# Patient Record
Sex: Male | Born: 1965
Health system: Southern US, Community
[De-identification: ages and names within clinical notes are randomized; demographics above are authoritative.]

## PROBLEM LIST (undated history)

## (undated) DIAGNOSIS — M5030 Other cervical disc degeneration, unspecified cervical region: Secondary | ICD-10-CM

## (undated) DIAGNOSIS — M5010 Cervical disc disorder with radiculopathy, unspecified cervical region: Secondary | ICD-10-CM

## (undated) DIAGNOSIS — M1990 Unspecified osteoarthritis, unspecified site: Secondary | ICD-10-CM

## (undated) DIAGNOSIS — E785 Hyperlipidemia, unspecified: Secondary | ICD-10-CM

## (undated) DIAGNOSIS — I10 Essential (primary) hypertension: Secondary | ICD-10-CM

## (undated) DIAGNOSIS — E7800 Pure hypercholesterolemia, unspecified: Secondary | ICD-10-CM

## (undated) DIAGNOSIS — F419 Anxiety disorder, unspecified: Secondary | ICD-10-CM

## (undated) DIAGNOSIS — K52832 Lymphocytic colitis: Secondary | ICD-10-CM

## (undated) DIAGNOSIS — M47812 Spondylosis without myelopathy or radiculopathy, cervical region: Secondary | ICD-10-CM

## (undated) HISTORY — DX: Anxiety disorder, unspecified: F41.9

## (undated) HISTORY — DX: Essential (primary) hypertension: I10

## (undated) HISTORY — DX: Hyperlipidemia, unspecified: E78.5

## (undated) HISTORY — DX: Lymphocytic colitis: K52.832

## (undated) HISTORY — PX: HAND SURGERY: SHX662

---

## 2004-01-29 ENCOUNTER — Encounter: Admission: RE | Admit: 2004-01-29 | Discharge: 2004-01-29 | Payer: Self-pay | Admitting: Nurse Practitioner

## 2004-02-26 ENCOUNTER — Encounter: Payer: Self-pay | Admitting: Physical Medicine and Rehabilitation

## 2004-03-06 ENCOUNTER — Encounter: Payer: Self-pay | Admitting: Physical Medicine and Rehabilitation

## 2012-03-08 ENCOUNTER — Other Ambulatory Visit: Payer: Self-pay | Admitting: Family Medicine

## 2012-03-08 DIAGNOSIS — R7989 Other specified abnormal findings of blood chemistry: Secondary | ICD-10-CM

## 2012-03-11 ENCOUNTER — Ambulatory Visit
Admission: RE | Admit: 2012-03-11 | Discharge: 2012-03-11 | Disposition: A | Discharge status: Home / Self Care | Payer: 59 | Source: Ambulatory Visit | Attending: Family Medicine | Admitting: Family Medicine

## 2012-03-11 ENCOUNTER — Other Ambulatory Visit: Payer: Self-pay | Admitting: Family Medicine

## 2012-03-11 DIAGNOSIS — R7989 Other specified abnormal findings of blood chemistry: Secondary | ICD-10-CM

## 2012-03-22 ENCOUNTER — Other Ambulatory Visit: Payer: Self-pay | Admitting: Family Medicine

## 2012-03-22 DIAGNOSIS — R935 Abnormal findings on diagnostic imaging of other abdominal regions, including retroperitoneum: Secondary | ICD-10-CM

## 2012-03-24 ENCOUNTER — Ambulatory Visit
Admission: RE | Admit: 2012-03-24 | Discharge: 2012-03-24 | Disposition: A | Discharge status: Home / Self Care | Payer: 59 | Source: Ambulatory Visit | Attending: Family Medicine | Admitting: Family Medicine

## 2012-03-24 DIAGNOSIS — R935 Abnormal findings on diagnostic imaging of other abdominal regions, including retroperitoneum: Secondary | ICD-10-CM

## 2012-03-24 MED ORDER — IOHEXOL 300 MG/ML  SOLN
100.0000 mL | Freq: Once | INTRAMUSCULAR | Status: AC | PRN
  Administered 2012-03-24: 125 mL via INTRAVENOUS

## 2012-04-04 ENCOUNTER — Other Ambulatory Visit: Payer: Self-pay | Admitting: Family Medicine

## 2012-04-04 DIAGNOSIS — R160 Hepatomegaly, not elsewhere classified: Secondary | ICD-10-CM

## 2012-04-07 ENCOUNTER — Ambulatory Visit
Admission: RE | Admit: 2012-04-07 | Discharge: 2012-04-07 | Disposition: A | Discharge status: Home / Self Care | Payer: 59 | Source: Ambulatory Visit | Attending: Family Medicine | Admitting: Family Medicine

## 2012-04-07 DIAGNOSIS — R160 Hepatomegaly, not elsewhere classified: Secondary | ICD-10-CM

## 2012-04-07 MED ORDER — GADOBENATE DIMEGLUMINE 529 MG/ML IV SOLN
20.0000 mL | Freq: Once | INTRAVENOUS | Status: AC | PRN
  Administered 2012-04-07: 20 mL via INTRAVENOUS

## 2012-07-09 ENCOUNTER — Other Ambulatory Visit: Payer: Self-pay | Admitting: Family Medicine

## 2012-10-25 ENCOUNTER — Other Ambulatory Visit: Payer: Self-pay | Admitting: Family Medicine

## 2012-10-25 DIAGNOSIS — Z79899 Other long term (current) drug therapy: Secondary | ICD-10-CM

## 2012-10-25 DIAGNOSIS — E782 Mixed hyperlipidemia: Secondary | ICD-10-CM

## 2012-10-26 ENCOUNTER — Other Ambulatory Visit: Payer: 59

## 2012-10-26 DIAGNOSIS — Z79899 Other long term (current) drug therapy: Secondary | ICD-10-CM

## 2012-10-26 DIAGNOSIS — E782 Mixed hyperlipidemia: Secondary | ICD-10-CM

## 2012-10-27 LAB — CBC WITH DIFFERENTIAL/PLATELET
Basophils Absolute: 0 10*3/uL (ref 0.0–0.1)
Basophils Relative: 1 % (ref 0–1)
Eosinophils Absolute: 0.1 10*3/uL (ref 0.0–0.7)
Eosinophils Relative: 3 % (ref 0–5)
HCT: 44.2 % (ref 39.0–52.0)
Hemoglobin: 14.7 g/dL (ref 13.0–17.0)
Lymphocytes Relative: 28 % (ref 12–46)
Lymphs Abs: 1.5 10*3/uL (ref 0.7–4.0)
MCH: 29.9 pg (ref 26.0–34.0)
MCHC: 33.3 g/dL (ref 30.0–36.0)
MCV: 90 fL (ref 78.0–100.0)
Monocytes Absolute: 0.5 10*3/uL (ref 0.1–1.0)
Monocytes Relative: 9 % (ref 3–12)
Neutro Abs: 3.1 10*3/uL (ref 1.7–7.7)
Neutrophils Relative %: 59 % (ref 43–77)
Platelets: 264 10*3/uL (ref 150–400)
RBC: 4.91 MIL/uL (ref 4.22–5.81)
RDW: 13.6 % (ref 11.5–15.5)
WBC: 5.2 10*3/uL (ref 4.0–10.5)

## 2012-10-27 LAB — LIPID PANEL
Cholesterol: 197 mg/dL (ref 0–200)
HDL: 49 mg/dL (ref >39)
LDL Cholesterol: 125 mg/dL — ABNORMAL HIGH (ref 0–99)
Total CHOL/HDL Ratio: 4 Ratio
Triglycerides: 116 mg/dL (ref <150)
VLDL: 23 mg/dL (ref 0–40)

## 2012-10-27 LAB — COMPREHENSIVE METABOLIC PANEL
ALT: 39 U/L (ref 0–53)
AST: 33 U/L (ref 0–37)
Albumin: 4.3 g/dL (ref 3.5–5.2)
Alkaline Phosphatase: 39 U/L (ref 39–117)
BUN: 14 mg/dL (ref 6–23)
CO2: 27 mEq/L (ref 19–32)
Calcium: 9.4 mg/dL (ref 8.4–10.5)
Chloride: 105 mEq/L (ref 96–112)
Creat: 1.05 mg/dL (ref 0.50–1.35)
Glucose, Bld: 89 mg/dL (ref 70–99)
Potassium: 4.9 mEq/L (ref 3.5–5.3)
Sodium: 140 mEq/L (ref 135–145)
Total Bilirubin: 0.9 mg/dL (ref 0.3–1.2)
Total Protein: 6.1 g/dL (ref 6.0–8.3)

## 2012-10-28 ENCOUNTER — Encounter: Payer: Self-pay | Admitting: Family Medicine

## 2012-10-28 ENCOUNTER — Ambulatory Visit (INDEPENDENT_AMBULATORY_CARE_PROVIDER_SITE_OTHER): Payer: 59 | Admitting: Family Medicine

## 2012-10-28 VITALS — BP 110/74 | HR 58 | Temp 97.9°F | Resp 14 | Wt 210.0 lb

## 2012-10-28 DIAGNOSIS — E785 Hyperlipidemia, unspecified: Secondary | ICD-10-CM

## 2012-10-28 DIAGNOSIS — I10 Essential (primary) hypertension: Secondary | ICD-10-CM | POA: Insufficient documentation

## 2012-10-28 NOTE — Progress Notes (Signed)
Subjective:    Patient ID: Darryl Ross, male    DOB: [birth year], 47 y.o.   MRN: [id number]  HPI Patient is here for followup of his hypertension as well as his hyperlipidemia. He is currently taking lisinopril 10 mg by mouth daily. He denies any chest pain, shortness of breath, or dyspnea on exertion. He does experience dizziness when he bends over. He is experiences lightheadedness when gets up in the morning. He states his blood pressures typically around 100/60. He is interested in possibly discontinuing the blood pressure medicine.  He also has hyperlipidemia. He is currently taking Zetia 10 mg by mouth daily. His most recent lab work is listed below. He denies any myalgia or right upper quadrant pain. He also has a history of elevated liver function test. An MRI of the abdomen obtained January 2014 shows no evidence of hepatic neoplasm. Most recent liver function tests were back to normal. Appointment on 10/26/2012  Component Date Value Range Status  . WBC 10/26/2012 5.2  4.0 - 10.5 K/uL Final  . RBC 10/26/2012 4.91  4.22 - 5.81 MIL/uL Final  . Hemoglobin 10/26/2012 14.7  13.0 - 17.0 g/dL Final  . HCT 78/29/5621 44.2  39.0 - 52.0 % Final  . MCV 10/26/2012 90.0  78.0 - 100.0 fL Final  . MCH 10/26/2012 29.9  26.0 - 34.0 pg Final  . MCHC 10/26/2012 33.3  30.0 - 36.0 g/dL Final  . RDW 30/86/5784 13.6  11.5 - 15.5 % Final  . Platelets 10/26/2012 264  150 - 400 K/uL Final  . Neutrophils Relative % 10/26/2012 59  43 - 77 % Final  . Neutro Abs 10/26/2012 3.1  1.7 - 7.7 K/uL Final  . Lymphocytes Relative 10/26/2012 28  12 - 46 % Final  . Lymphs Abs 10/26/2012 1.5  0.7 - 4.0 K/uL Final  . Monocytes Relative 10/26/2012 9  3 - 12 % Final  . Monocytes Absolute 10/26/2012 0.5  0.1 - 1.0 K/uL Final  . Eosinophils Relative 10/26/2012 3  0 - 5 % Final  . Eosinophils Absolute 10/26/2012 0.1  0.0 - 0.7 K/uL Final  . Basophils Relative 10/26/2012 1  0 - 1 % Final  . Basophils Absolute 10/26/2012 0.0   0.0 - 0.1 K/uL Final  . Smear Review 10/26/2012 Criteria for review not met   Final  . Cholesterol 10/26/2012 197  0 - 200 mg/dL Final   Comment: ATP III Classification:                                < 200        mg/dL        Desirable                               200 - 239     mg/dL        Borderline High                               >= 240        mg/dL        High                             . Triglycerides 10/26/2012 116  <150 mg/dL Final  .  HDL 10/26/2012 49  >39 mg/dL Final  . Total CHOL/HDL Ratio 10/26/2012 4.0   Final  . VLDL 10/26/2012 23  0 - 40 mg/dL Final  . LDL Cholesterol 10/26/2012 125* 0 - 99 mg/dL Final   Comment:                            Total Cholesterol/HDL Ratio:CHD Risk                                                 Coronary Heart Disease Risk Table                                                                 Men       Women                                   1/2 Average Risk              3.4        3.3                                       Average Risk              5.0        4.4                                    2X Average Risk              9.6        7.1                                    3X Average Risk             23.4       11.0                          Use the calculated Patient Ratio above and the CHD Risk table                           to determine the patient's CHD Risk.                          ATP III Classification (LDL):                                < 100        mg/dL         Optimal  100 - 129     mg/dL         Near or Above Optimal                               130 - 159     mg/dL         Borderline High                               160 - 189     mg/dL         High                                > 190        mg/dL         Very High                             . Sodium 10/26/2012 140  135 - 145 mEq/L Final  . Potassium 10/26/2012 4.9  3.5 - 5.3 mEq/L Final  . Chloride 10/26/2012 105  96 - 112 mEq/L Final   . CO2 10/26/2012 27  19 - 32 mEq/L Final  . Glucose, Bld 10/26/2012 89  70 - 99 mg/dL Final  . BUN 16/01/9603 14  6 - 23 mg/dL Final  . Creat 54/12/8117 1.05  0.50 - 1.35 mg/dL Final  . Total Bilirubin 10/26/2012 0.9  0.3 - 1.2 mg/dL Final  . Alkaline Phosphatase 10/26/2012 39  39 - 117 U/L Final  . AST 10/26/2012 33  0 - 37 U/L Final  . ALT 10/26/2012 39  0 - 53 U/L Final  . Total Protein 10/26/2012 6.1  6.0 - 8.3 g/dL Final  . Albumin 14/78/2956 4.3  3.5 - 5.2 g/dL Final  . Calcium 21/30/8657 9.4  8.4 - 10.5 mg/dL Final   . Past Medical History  Diagnosis Date  . Hyperlipidemia   . Hypertension    Current outpatient prescriptions:ALPRAZolam (XANAX) 0.5 MG tablet, Take 0.5 mg by mouth at bedtime as needed for sleep., Disp: , Rfl: ;  lisinopril (PRINIVIL,ZESTRIL) 10 MG tablet, Take 10 mg by mouth daily., Disp: , Rfl: ;  meloxicam (MOBIC) 15 MG tablet, Take 15 mg by mouth daily., Disp: , Rfl: ;  PARoxetine (PAXIL) 20 MG tablet, Take 20 mg by mouth every morning., Disp: , Rfl:  ZETIA 10 MG tablet, TAKE 1 TABLET BY MOUTH EVERY DAY, Disp: 30 tablet, Rfl: 3  Allergies  Allergen Reactions  . Morphine And Related   . Statins    History   Social History  . Marital Status: Married    Spouse Name: N/A    Number of Children: N/A  . Years of Education: N/A   Occupational History  . Not on file.   Social History Main Topics  . Smoking status: Never Smoker   . Smokeless tobacco: Not on file  . Alcohol Use: Yes     Comment: occasional  . Drug Use: No  . Sexually Active: Not on file   Other Topics Concern  . Not on file   Social History Narrative  . No narrative on file      Review of Systems  All other systems reviewed and are negative.       Objective:  Physical Exam  Vitals reviewed. Constitutional: He appears well-developed and well-nourished.  Cardiovascular: Normal rate, regular rhythm, normal heart sounds and intact distal pulses.  Exam reveals no gallop and  no friction rub.   No murmur heard. Pulmonary/Chest: Effort normal and breath sounds normal. No respiratory distress. He has no wheezes. He has no rales. He exhibits no tenderness.  Abdominal: Soft. Bowel sounds are normal. He exhibits no distension and no mass. There is no tenderness. There is no rebound and no guarding.  Musculoskeletal: Normal range of motion. He exhibits no edema.          Assessment & Plan:  1. Hyperlipidemia Cholesterol is at goal. Continue zetia 10 milligrams by mouth daily. Recheck in 6 months  2. Hypertension Decrease lisinopril to 5 mg by mouth daily. If blood pressure remains low, we will discontinue the medication altogether.

## 2012-11-08 ENCOUNTER — Other Ambulatory Visit: Payer: Self-pay | Admitting: Family Medicine

## 2013-03-04 ENCOUNTER — Other Ambulatory Visit: Payer: Self-pay | Admitting: Family Medicine

## 2013-04-01 ENCOUNTER — Other Ambulatory Visit: Payer: Self-pay | Admitting: Family Medicine

## 2013-06-24 ENCOUNTER — Other Ambulatory Visit: Payer: Self-pay | Admitting: Family Medicine

## 2013-06-24 DIAGNOSIS — E785 Hyperlipidemia, unspecified: Secondary | ICD-10-CM

## 2013-06-26 ENCOUNTER — Encounter: Payer: Self-pay | Admitting: Family Medicine

## 2013-06-26 NOTE — Telephone Encounter (Signed)
Medication refill for one time only.  Patient needs to be seen.  Letter sent for patient to call and schedule 

## 2013-07-03 ENCOUNTER — Other Ambulatory Visit: Payer: 59

## 2013-07-03 DIAGNOSIS — Z0000 Encounter for general adult medical examination without abnormal findings: Secondary | ICD-10-CM

## 2013-07-03 DIAGNOSIS — E785 Hyperlipidemia, unspecified: Secondary | ICD-10-CM

## 2013-07-03 DIAGNOSIS — Z79899 Other long term (current) drug therapy: Secondary | ICD-10-CM

## 2013-07-03 LAB — COMPLETE METABOLIC PANEL WITH GFR
ALT: 30 U/L (ref 0–53)
AST: 20 U/L (ref 0–37)
Albumin: 4.3 g/dL (ref 3.5–5.2)
Alkaline Phosphatase: 57 U/L (ref 39–117)
BUN: 12 mg/dL (ref 6–23)
CO2: 28 mEq/L (ref 19–32)
Calcium: 9 mg/dL (ref 8.4–10.5)
Chloride: 101 mEq/L (ref 96–112)
Creat: 1.11 mg/dL (ref 0.50–1.35)
GFR, Est African American: >89 mL/min
GFR, Est Non African American: 78 mL/min
Glucose, Bld: 86 mg/dL (ref 70–99)
Potassium: 4.4 mEq/L (ref 3.5–5.3)
Sodium: 140 mEq/L (ref 135–145)
Total Bilirubin: 0.6 mg/dL (ref 0.2–1.2)
Total Protein: 6.5 g/dL (ref 6.0–8.3)

## 2013-07-03 LAB — CBC WITH DIFFERENTIAL/PLATELET
Basophils Absolute: 0.1 10*3/uL (ref 0.0–0.1)
Basophils Relative: 1 % (ref 0–1)
Eosinophils Absolute: 0.2 10*3/uL (ref 0.0–0.7)
Eosinophils Relative: 3 % (ref 0–5)
HCT: 44.8 % (ref 39.0–52.0)
Hemoglobin: 15.3 g/dL (ref 13.0–17.0)
Lymphocytes Relative: 18 % (ref 12–46)
Lymphs Abs: 1.5 10*3/uL (ref 0.7–4.0)
MCH: 30.2 pg (ref 26.0–34.0)
MCHC: 34.2 g/dL (ref 30.0–36.0)
MCV: 88.5 fL (ref 78.0–100.0)
Monocytes Absolute: 0.9 10*3/uL (ref 0.1–1.0)
Monocytes Relative: 11 % (ref 3–12)
Neutro Abs: 5.6 10*3/uL (ref 1.7–7.7)
Neutrophils Relative %: 67 % (ref 43–77)
Platelets: 263 10*3/uL (ref 150–400)
RBC: 5.06 MIL/uL (ref 4.22–5.81)
RDW: 13.4 % (ref 11.5–15.5)
WBC: 8.3 10*3/uL (ref 4.0–10.5)

## 2013-07-03 LAB — LIPID PANEL
Cholesterol: 171 mg/dL (ref 0–200)
HDL: 40 mg/dL (ref >39)
LDL Cholesterol: 86 mg/dL (ref 0–99)
Total CHOL/HDL Ratio: 4.3 Ratio
Triglycerides: 225 mg/dL — ABNORMAL HIGH (ref <150)
VLDL: 45 mg/dL — ABNORMAL HIGH (ref 0–40)

## 2013-07-06 ENCOUNTER — Encounter: Payer: Self-pay | Admitting: Family Medicine

## 2013-07-06 ENCOUNTER — Ambulatory Visit (INDEPENDENT_AMBULATORY_CARE_PROVIDER_SITE_OTHER): Payer: 59 | Admitting: Family Medicine

## 2013-07-06 VITALS — BP 136/90 | HR 86 | Temp 98.2°F | Resp 16 | Ht 72.0 in | Wt 220.0 lb

## 2013-07-06 DIAGNOSIS — J209 Acute bronchitis, unspecified: Secondary | ICD-10-CM

## 2013-07-06 DIAGNOSIS — E785 Hyperlipidemia, unspecified: Secondary | ICD-10-CM

## 2013-07-06 DIAGNOSIS — I10 Essential (primary) hypertension: Secondary | ICD-10-CM

## 2013-07-06 MED ORDER — MELOXICAM 15 MG PO TABS: 15.0000 mg | ORAL_TABLET | Freq: Every day | ORAL | Status: DC

## 2013-07-06 MED ORDER — EZETIMIBE 10 MG PO TABS: ORAL_TABLET | ORAL | Status: DC

## 2013-07-06 MED ORDER — PAROXETINE HCL 20 MG PO TABS: ORAL_TABLET | ORAL | Status: DC

## 2013-07-06 MED ORDER — ALPRAZOLAM 0.5 MG PO TABS: 0.5000 mg | ORAL_TABLET | Freq: Every evening | ORAL | Status: DC | PRN

## 2013-07-06 MED ORDER — AZITHROMYCIN 250 MG PO TABS: ORAL_TABLET | ORAL | Status: DC

## 2013-07-06 MED ORDER — HYDROCODONE-HOMATROPINE 5-1.5 MG/5ML PO SYRP: 5.0000 mL | ORAL_SOLUTION | Freq: Three times a day (TID) | ORAL | Status: DC | PRN

## 2013-07-06 NOTE — Progress Notes (Signed)
Subjective:    Patient ID: Darryl Ross, male    DOB: [birth year], 48 y.o.   MRN: [id number]  HPI Patient history of hypertension and liver irritation due to fatty liver disease, and hyperlipidemia. He is here today for followup. His blood pressure is elevated 136/90. He has recently gained weight due to his inability to exercise because an ankle injury. He states his blood pressure home is ranging 130s over 80s. He denies any chest pain short of breath or dyspnea on exertion. He denies any myalgia right quadrant pain. He is not taking lisinopril. He has had a cough productive of green sputum for greater than 10 days. He reports subjective fevers. The cough seems to be worsening. He denies any chest pain or shortness of breath. Past Medical History  Diagnosis Date  . Hyperlipidemia   . Hypertension    Current Outpatient Prescriptions on File Prior to Visit  Medication Sig Dispense Refill  . lisinopril (PRINIVIL,ZESTRIL) 10 MG tablet Take 10 mg by mouth daily.       No current facility-administered medications on file prior to visit.   Allergies  Allergen Reactions  . Morphine And Related   . Statins    History   Social History  . Marital Status: Married    Spouse Name: N/A    Number of Children: N/A  . Years of Education: N/A   Occupational History  . Not on file.   Social History Main Topics  . Smoking status: Never Smoker   . Smokeless tobacco: Not on file  . Alcohol Use: Yes     Comment: occasional  . Drug Use: No  . Sexual Activity: Not on file   Other Topics Concern  . Not on file   Social History Narrative  . No narrative on file      Review of Systems  All other systems reviewed and are negative.       Objective:   Physical Exam  Vitals reviewed. Constitutional: He is oriented to person, place, and time. He appears well-developed and well-nourished. No distress.  HENT:  Right Ear: External ear normal.  Left Ear: External ear normal.  Nose:  Nose normal.  Mouth/Throat: Oropharynx is clear and moist. No oropharyngeal exudate.  Neck: Normal range of motion. Neck supple.  Cardiovascular: Normal rate, regular rhythm and normal heart sounds.   No murmur heard. Pulmonary/Chest: Effort normal. No respiratory distress. He has no wheezes. He has rales.  Lymphadenopathy:    He has no cervical adenopathy.  Neurological: He is alert and oriented to person, place, and time. No cranial nerve deficit. Coordination normal.  Skin: Skin is warm. No rash noted. He is not diaphoretic. No erythema. No pallor.          Assessment & Plan:  1. Acute bronchitis - azithromycin (ZITHROMAX) 250 MG tablet; 2 tabs poqday1, 1 tab poqday 2-5  Dispense: 6 tablet; Refill: 0 I also gave patient prescription for Hycodan 1 teaspoon every 8 hours as needed for cough. 2. HLD (hyperlipidemia) Patient's lab work is listed below: Lab on 07/03/2013  Component Date Value Ref Range Status  . WBC 07/03/2013 8.3  4.0 - 10.5 K/uL Final  . RBC 07/03/2013 5.06  4.22 - 5.81 MIL/uL Final  . Hemoglobin 07/03/2013 15.3  13.0 - 17.0 g/dL Final  . HCT 07/03/2013 44.8  39.0 - 52.0 % Final  . MCV 07/03/2013 88.5  78.0 - 100.0 fL Final  . MCH 07/03/2013 30.2  26.0 - 34.0 pg  Final  . MCHC 07/03/2013 34.2  30.0 - 36.0 g/dL Final  . RDW 07/03/2013 13.4  11.5 - 15.5 % Final  . Platelets 07/03/2013 263  150 - 400 K/uL Final  . Neutrophils Relative % 07/03/2013 67  43 - 77 % Final  . Neutro Abs 07/03/2013 5.6  1.7 - 7.7 K/uL Final  . Lymphocytes Relative 07/03/2013 18  12 - 46 % Final  . Lymphs Abs 07/03/2013 1.5  0.7 - 4.0 K/uL Final  . Monocytes Relative 07/03/2013 11  3 - 12 % Final  . Monocytes Absolute 07/03/2013 0.9  0.1 - 1.0 K/uL Final  . Eosinophils Relative 07/03/2013 3  0 - 5 % Final  . Eosinophils Absolute 07/03/2013 0.2  0.0 - 0.7 K/uL Final  . Basophils Relative 07/03/2013 1  0 - 1 % Final  . Basophils Absolute 07/03/2013 0.1  0.0 - 0.1 K/uL Final  . Smear  Review 07/03/2013 Criteria for review not met   Final  . Cholesterol 07/03/2013 171  0 - 200 mg/dL Final   Comment: ATP III Classification:                                < 200        mg/dL        Desirable                               200 - 239     mg/dL        Borderline High                               >= 240        mg/dL        High                             . Triglycerides 07/03/2013 225* <150 mg/dL Final  . HDL 07/03/2013 40  >39 mg/dL Final  . Total CHOL/HDL Ratio 07/03/2013 4.3   Final  . VLDL 07/03/2013 45* 0 - 40 mg/dL Final  . LDL Cholesterol 07/03/2013 86  0 - 99 mg/dL Final   Comment:                            Total Cholesterol/HDL Ratio:CHD Risk                                                 Coronary Heart Disease Risk Table                                                                 Men       Women                                   1/2  Average Risk              3.4        3.3                                       Average Risk              5.0        4.4                                    2X Average Risk              9.6        7.1                                    3X Average Risk             23.4       11.0                          Use the calculated Patient Ratio above and the CHD Risk table                           to determine the patient's CHD Risk.                          ATP III Classification (LDL):                                < 100        mg/dL         Optimal                               100 - 129     mg/dL         Near or Above Optimal                               130 - 159     mg/dL         Borderline High                               160 - 189     mg/dL         High                                > 190        mg/dL         Very High                             . Sodium 07/03/2013 140  135 - 145 mEq/L Final  . Potassium 07/03/2013 4.4  3.5 - 5.3 mEq/L Final  . Chloride 07/03/2013  101  96 - 112 mEq/L Final  . CO2 07/03/2013 28  19 - 32  mEq/L Final  . Glucose, Bld 07/03/2013 86  70 - 99 mg/dL Final  . BUN 07/03/2013 12  6 - 23 mg/dL Final  . Creat 07/03/2013 1.11  0.50 - 1.35 mg/dL Final  . Total Bilirubin 07/03/2013 0.6  0.2 - 1.2 mg/dL Final  . Alkaline Phosphatase 07/03/2013 57  39 - 117 U/L Final  . AST 07/03/2013 20  0 - 37 U/L Final  . ALT 07/03/2013 30  0 - 53 U/L Final  . Total Protein 07/03/2013 6.5  6.0 - 8.3 g/dL Final  . Albumin 07/03/2013 4.3  3.5 - 5.2 g/dL Final  . Calcium 07/03/2013 9.0  8.4 - 10.5 mg/dL Final  . GFR, Est African American 07/03/2013 >89   Final  . GFR, Est Non African American 07/03/2013 78   Final   Comment:                            The estimated GFR is a calculation valid for adults (>=21 years old)                          that uses the CKD-EPI algorithm to adjust for age and sex. It is                            not to be used for children, pregnant women, hospitalized patients,                             patients on dialysis, or with rapidly changing kidney function.                          According to the NKDEP, eGFR >89 is normal, 60-89 shows mild                          impairment, 30-59 shows moderate impairment, 15-29 shows severe                          impairment and <15 is ESRD.                              Continue current medications at their present dosages - ezetimibe (ZETIA) 10 MG tablet; TAKE 1 TABLET BY MOUTH EVERY DAY  Dispense: 30 tablet; Refill: 11  3. Hypertension Pressures borderline. The patient like stridor increase his diet and exercise and see if he can lose weight to help address his blood pressure. He is consistently greater than 140/90 I would resume lisinopril.

## 2013-11-09 ENCOUNTER — Other Ambulatory Visit: Payer: Self-pay | Admitting: Family Medicine

## 2013-11-09 ENCOUNTER — Ambulatory Visit (INDEPENDENT_AMBULATORY_CARE_PROVIDER_SITE_OTHER): Payer: 59 | Admitting: Family Medicine

## 2013-11-09 VITALS — BP 136/90 | HR 100 | Temp 98.9°F | Resp 16 | Ht 72.0 in | Wt 219.0 lb

## 2013-11-09 DIAGNOSIS — R509 Fever, unspecified: Secondary | ICD-10-CM

## 2013-11-09 DIAGNOSIS — R300 Dysuria: Secondary | ICD-10-CM

## 2013-11-09 LAB — URINALYSIS, ROUTINE W REFLEX MICROSCOPIC
Glucose, UA: NEGATIVE mg/dL
Hgb urine dipstick: NEGATIVE
Leukocytes, UA: NEGATIVE
Nitrite: NEGATIVE
Protein, ur: 30 mg/dL — AB
Specific Gravity, Urine: 1.025 (ref 1.005–1.030)
Urobilinogen, UA: 1 mg/dL (ref 0.0–1.0)
pH: 6 (ref 5.0–8.0)

## 2013-11-09 LAB — URINALYSIS, MICROSCOPIC ONLY
Casts: NONE SEEN
Crystals: NONE SEEN
RBC / HPF: NONE SEEN RBC/hpf (ref <3)

## 2013-11-09 MED ORDER — CIPROFLOXACIN HCL 500 MG PO TABS: 500.0000 mg | ORAL_TABLET | Freq: Two times a day (BID) | ORAL | Status: DC

## 2013-11-10 ENCOUNTER — Encounter: Payer: Self-pay | Admitting: Family Medicine

## 2013-11-10 ENCOUNTER — Other Ambulatory Visit: Payer: 59

## 2013-11-10 ENCOUNTER — Other Ambulatory Visit: Payer: Self-pay | Admitting: *Deleted

## 2013-11-10 DIAGNOSIS — R7989 Other specified abnormal findings of blood chemistry: Secondary | ICD-10-CM

## 2013-11-10 DIAGNOSIS — R945 Abnormal results of liver function studies: Principal | ICD-10-CM

## 2013-11-10 LAB — HEPATITIS PANEL, ACUTE
HCV Ab: NEGATIVE
HEP B S AG: NEGATIVE
Hep A IgM: NONREACTIVE
Hep B C IgM: NONREACTIVE

## 2013-11-10 LAB — CBC WITH DIFFERENTIAL/PLATELET
Basophils Absolute: 0 10*3/uL (ref 0.0–0.1)
Basophils Relative: 1 % (ref 0–1)
Eosinophils Absolute: 0.1 10*3/uL (ref 0.0–0.7)
Eosinophils Relative: 2 % (ref 0–5)
HCT: 45 % (ref 39.0–52.0)
Hemoglobin: 15.3 g/dL (ref 13.0–17.0)
Lymphocytes Relative: 10 % — ABNORMAL LOW (ref 12–46)
Lymphs Abs: 0.3 10*3/uL — ABNORMAL LOW (ref 0.7–4.0)
MCH: 29.9 pg (ref 26.0–34.0)
MCHC: 34 g/dL (ref 30.0–36.0)
MCV: 87.9 fL (ref 78.0–100.0)
Monocytes Absolute: 0.6 10*3/uL (ref 0.1–1.0)
Monocytes Relative: 18 % — ABNORMAL HIGH (ref 3–12)
Neutro Abs: 2.1 10*3/uL (ref 1.7–7.7)
Neutrophils Relative %: 69 % (ref 43–77)
Platelets: 194 10*3/uL (ref 150–400)
RBC: 5.12 MIL/uL (ref 4.22–5.81)
RDW: 13.6 % (ref 11.5–15.5)
WBC: 3.1 10*3/uL — ABNORMAL LOW (ref 4.0–10.5)

## 2013-11-10 LAB — COMPLETE METABOLIC PANEL WITH GFR
ALT: 406 U/L — ABNORMAL HIGH (ref 0–53)
AST: 343 U/L — ABNORMAL HIGH (ref 0–37)
Albumin: 4.5 g/dL (ref 3.5–5.2)
Alkaline Phosphatase: 166 U/L — ABNORMAL HIGH (ref 39–117)
BUN: 17 mg/dL (ref 6–23)
CO2: 26 mEq/L (ref 19–32)
Calcium: 9.2 mg/dL (ref 8.4–10.5)
Chloride: 101 mEq/L (ref 96–112)
Creat: 1.5 mg/dL — ABNORMAL HIGH (ref 0.50–1.35)
GFR, Est African American: 62 mL/min
GFR, Est Non African American: 54 mL/min — ABNORMAL LOW
Glucose, Bld: 106 mg/dL — ABNORMAL HIGH (ref 70–99)
Potassium: 4.5 mEq/L (ref 3.5–5.3)
Sodium: 138 mEq/L (ref 135–145)
Total Bilirubin: 1.1 mg/dL (ref 0.2–1.2)
Total Protein: 6.6 g/dL (ref 6.0–8.3)

## 2013-11-10 NOTE — Progress Notes (Signed)
Subjective:    Patient ID: Darryl Ross, male    DOB: 1966-02-16, 48 y.o.   MRN: [id number]  HPI 5 days ago, the patient suffered a puncture wound to the second and third digits on his right hand from a framing nailer while building a ramp.  Patient went to urgent care where x-rays were negative. The nail was removed. He was given tetanus prophylaxis. He was also started on Bactrim as well as Keflex.  He developed GI symptoms from antibiotics and discontinued them yesterday.  However over the last 2 days he has developed flulike symptoms.  He complains of fever to 100.6, chills and diffuse myalgias. He is complaining of pain in his neck, lower back, CVA tenderness, pain radiating into both hips, and dysuria. He also complains of hematuria and dark-colored urine.  He denies any rashes.  Denies any recent tick bites. He denies any sick contacts. His GI symptoms have improved since discontinuing antibiotics.  He denies any tetanus symptoms. He denies any trismus, spasticity, muscle rigidity, and there is no evidence of hyperreflexia on his exam. Past Medical History  Diagnosis Date  . Hyperlipidemia   . Hypertension    No past surgical history on file. Current Outpatient Prescriptions on File Prior to Visit  Medication Sig Dispense Refill  . ALPRAZolam (XANAX) 0.5 MG tablet Take 1 tablet (0.5 mg total) by mouth at bedtime as needed for sleep.  30 tablet  0  . ezetimibe (ZETIA) 10 MG tablet TAKE 1 TABLET BY MOUTH EVERY DAY  30 tablet  11  . HYDROcodone-homatropine (HYCODAN) 5-1.5 MG/5ML syrup Take 5 mLs by mouth every 8 (eight) hours as needed for cough.  120 mL  0  . lisinopril (PRINIVIL,ZESTRIL) 10 MG tablet Take 10 mg by mouth daily.      . meloxicam (MOBIC) 15 MG tablet Take 1 tablet (15 mg total) by mouth daily.  30 tablet  5  . PARoxetine (PAXIL) 20 MG tablet TAKE 1 TABLET BY MOUTH EVERY DAY  30 tablet  11   No current facility-administered medications on file prior to visit.    Allergies  Allergen Reactions  . Morphine And Related   . Statins    History   Social History  . Marital Status: Married    Spouse Name: N/A    Number of Children: N/A  . Years of Education: N/A   Occupational History  . Not on file.   Social History Main Topics  . Smoking status: Never Smoker   . Smokeless tobacco: Not on file  . Alcohol Use: Yes     Comment: occasional  . Drug Use: No  . Sexual Activity: Not on file   Other Topics Concern  . Not on file   Social History Narrative  . No narrative on file      Review of Systems  All other systems reviewed and are negative.      Objective:   Physical Exam  Vitals reviewed. Constitutional: He is oriented to person, place, and time. He appears well-developed and well-nourished. No distress.  HENT:  Right Ear: External ear normal.  Left Ear: External ear normal.  Nose: Nose normal.  Mouth/Throat: Oropharynx is clear and moist. No oropharyngeal exudate.  Eyes: Conjunctivae and EOM are normal. Pupils are equal, round, and reactive to light. No scleral icterus.  Neck: Neck supple. No JVD present. No tracheal deviation present. No thyromegaly present.  Cardiovascular: Normal rate, regular rhythm and normal heart sounds.  Exam reveals  no gallop and no friction rub.   No murmur heard. Pulmonary/Chest: Effort normal and breath sounds normal. No respiratory distress. He has no wheezes. He has no rales. He exhibits no tenderness.  Abdominal: Soft. Bowel sounds are normal. He exhibits no distension and no mass. There is no tenderness. There is no rebound and no guarding.  Lymphadenopathy:    He has no cervical adenopathy.  Neurological: He is alert and oriented to person, place, and time. He has normal reflexes. He displays normal reflexes. No cranial nerve deficit. He exhibits normal muscle tone. Coordination normal.  Skin: Skin is warm. No rash noted. He is not diaphoretic. No erythema. No pallor.           Assessment & Plan:  1. Dysuria - Urinalysis, Routine w reflex microscopic - COMPLETE METABOLIC PANEL WITH GFR - CBC with Differential - ciprofloxacin (CIPRO) 500 MG tablet; Take 1 tablet (500 mg total) by mouth 2 (two) times daily.  Dispense: 20 tablet; Refill: 0 - Urine culture  2. Fever, unspecified  Patient's physical exam is completely normal. There is no evidence of cellulitis and puncture wounds on his right hand. There is no evidence of osteomyelitis. The patient's symptoms sound viral in nature. It is possible he has a viral gastroenteritis. However given the dysuria and CVA tenderness that he is experiencing I am also concerned about possible urinary tract infection and possible prostatitis. Therefore I will cover the patient with Cipro 500 mg by mouth twice a day for 10 days. Also send a urine culture and check a CBC along with a CMP.  Recheck next week if no better or immediately if worse.

## 2013-11-11 LAB — URINE CULTURE
Colony Count: NO GROWTH
Organism ID, Bacteria: NO GROWTH

## 2013-11-13 ENCOUNTER — Ambulatory Visit (INDEPENDENT_AMBULATORY_CARE_PROVIDER_SITE_OTHER): Payer: 59 | Admitting: Family Medicine

## 2013-11-13 ENCOUNTER — Encounter: Payer: Self-pay | Admitting: Family Medicine

## 2013-11-13 VITALS — BP 120/86 | HR 82 | Temp 97.8°F | Resp 18 | Wt 220.0 lb

## 2013-11-13 DIAGNOSIS — K759 Inflammatory liver disease, unspecified: Secondary | ICD-10-CM

## 2013-11-13 LAB — COMPLETE METABOLIC PANEL WITH GFR
ALBUMIN: 4.4 g/dL (ref 3.5–5.2)
ALK PHOS: 201 U/L — AB (ref 39–117)
ALT: 311 U/L — ABNORMAL HIGH (ref 0–53)
AST: 153 U/L — AB (ref 0–37)
BUN: 13 mg/dL (ref 6–23)
CALCIUM: 9.6 mg/dL (ref 8.4–10.5)
CO2: 29 mEq/L (ref 19–32)
CREATININE: 1.15 mg/dL (ref 0.50–1.35)
Chloride: 100 mEq/L (ref 96–112)
GFR, EST NON AFRICAN AMERICAN: 74 mL/min
GFR, Est African American: 86 mL/min
GLUCOSE: 86 mg/dL (ref 70–99)
Potassium: 4.5 mEq/L (ref 3.5–5.3)
Sodium: 140 mEq/L (ref 135–145)
Total Bilirubin: 0.6 mg/dL (ref 0.2–1.2)
Total Protein: 6.6 g/dL (ref 6.0–8.3)

## 2013-11-13 LAB — ROCKY MTN SPOTTED FVR ABS PNL(IGG+IGM)
RMSF IGG: 0.05 IV
RMSF IGM: 0.14 IV

## 2013-11-13 NOTE — Progress Notes (Signed)
Subjective:    Patient ID: Darryl Ross, male    DOB: [birth year], 48 y.o.   MRN: [id number]  HPI 11/09/13 5 days ago, the patient suffered a puncture wound to the second and third digits on his right hand from a framing nailer while building a ramp.  Patient went to urgent care where x-rays were negative. The nail was removed. He was given tetanus prophylaxis. He was also started on Bactrim as well as Keflex.  He developed GI symptoms from antibiotics and discontinued them yesterday.  However over the last 2 days he has developed flulike symptoms.  He complains of fever to 100.6, chills and diffuse myalgias. He is complaining of pain in his neck, lower back, CVA tenderness, pain radiating into both hips, and dysuria. He also complains of hematuria and dark-colored urine.  He denies any rashes.  Denies any recent tick bites. He denies any sick contacts. His GI symptoms have improved since discontinuing antibiotics.  He denies any tetanus symptoms. He denies any trismus, spasticity, muscle rigidity, and there is no evidence of hyperreflexia on his exam.  At that time, my plan was: 1. Dysuria - Urinalysis, Routine w reflex microscopic - COMPLETE METABOLIC PANEL WITH GFR - CBC with Differential - ciprofloxacin (CIPRO) 500 MG tablet; Take 1 tablet (500 mg total) by mouth 2 (two) times daily.  Dispense: 20 tablet; Refill: 0 - Urine culture  2. Fever, unspecified  Patient's physical exam is completely normal. There is no evidence of cellulitis and puncture wounds on his right hand. There is no evidence of osteomyelitis. The patient's symptoms sound viral in nature. It is possible he has a viral gastroenteritis. However given the dysuria and CVA tenderness that he is experiencing I am also concerned about possible urinary tract infection and possible prostatitis. Therefore I will cover the patient with Cipro 500 mg by mouth twice a day for 10 days. Also send a urine culture and check a CBC along with a  CMP.  Recheck next week if no better or immediately if worse.  11/13/13 Office Visit on 11/09/2013  Component Date Value Ref Range Status  . Color, Urine 11/09/2013 AMBER* YELLOW Final   Biochemicals may be affected by the color of the urine.  Marland Kitchen APPearance 11/09/2013 CLEAR  CLEAR Final  . Specific Gravity, Urine 11/09/2013 1.025  1.005 - 1.030 Final  . pH 11/09/2013 6.0  5.0 - 8.0 Final  . Glucose, UA 11/09/2013 NEG  NEG mg/dL Final  . Bilirubin Urine 11/09/2013 SMALL* NEG Final  . Ketones, ur 11/09/2013 TRACE* NEG mg/dL Final  . Hgb urine dipstick 11/09/2013 NEG  NEG Final  . Protein, ur 11/09/2013 30* NEG mg/dL Final  . Urobilinogen, UA 11/09/2013 1  0.0 - 1.0 mg/dL Final  . Nitrite 11/09/2013 NEG  NEG Final  . Leukocytes, UA 11/09/2013 NEG  NEG Final  . Squamous Epithelial / LPF 11/09/2013 RARE  RARE Final  . Crystals 11/09/2013 NONE SEEN  NONE SEEN Final  . Casts 11/09/2013 NONE SEEN  NONE SEEN Final  . WBC, UA 11/09/2013 0-2  <3 WBC/hpf Final  . RBC / HPF 11/09/2013 NONE SEEN  <3 RBC/hpf Final  . Bacteria, UA 11/09/2013 FEW* RARE Final  . Sodium 11/09/2013 138  135 - 145 mEq/L Final  . Potassium 11/09/2013 4.5  3.5 - 5.3 mEq/L Final  . Chloride 11/09/2013 101  96 - 112 mEq/L Final  . CO2 11/09/2013 26  19 - 32 mEq/L Final  . Glucose, Bld 11/09/2013  106* 70 - 99 mg/dL Final  . BUN 11/09/2013 17  6 - 23 mg/dL Final  . Creat 11/09/2013 1.50* 0.50 - 1.35 mg/dL Final  . Total Bilirubin 11/09/2013 1.1  0.2 - 1.2 mg/dL Final  . Alkaline Phosphatase 11/09/2013 166* 39 - 117 U/L Final  . AST 11/09/2013 343* 0 - 37 U/L Final  . ALT 11/09/2013 406* 0 - 53 U/L Final   Result confirmed by automatic dilution.  . Total Protein 11/09/2013 6.6  6.0 - 8.3 g/dL Final  . Albumin 11/09/2013 4.5  3.5 - 5.2 g/dL Final  . Calcium 11/09/2013 9.2  8.4 - 10.5 mg/dL Final  . GFR, Est African American 11/09/2013 62   Final  . GFR, Est Non African American 11/09/2013 54*  Final   Comment:                             The estimated GFR is a calculation valid for adults (>=6 years old)                          that uses the CKD-EPI algorithm to adjust for age and sex. It is                            not to be used for children, pregnant women, hospitalized patients,                             patients on dialysis, or with rapidly changing kidney function.                          According to the NKDEP, eGFR >89 is normal, 60-89 shows mild                          impairment, 30-59 shows moderate impairment, 15-29 shows severe                          impairment and <15 is ESRD.                             . WBC 11/09/2013 3.1* 4.0 - 10.5 K/uL Final  . RBC 11/09/2013 5.12  4.22 - 5.81 MIL/uL Final  . Hemoglobin 11/09/2013 15.3  13.0 - 17.0 g/dL Final  . HCT 11/09/2013 45.0  39.0 - 52.0 % Final  . MCV 11/09/2013 87.9  78.0 - 100.0 fL Final  . MCH 11/09/2013 29.9  26.0 - 34.0 pg Final  . MCHC 11/09/2013 34.0  30.0 - 36.0 g/dL Final  . RDW 11/09/2013 13.6  11.5 - 15.5 % Final  . Platelets 11/09/2013 194  150 - 400 K/uL Final  . Neutrophils Relative % 11/09/2013 69  43 - 77 % Final  . Neutro Abs 11/09/2013 2.1  1.7 - 7.7 K/uL Final  . Lymphocytes Relative 11/09/2013 10* 12 - 46 % Final  . Lymphs Abs 11/09/2013 0.3* 0.7 - 4.0 K/uL Final  . Monocytes Relative 11/09/2013 18* 3 - 12 % Final  . Monocytes Absolute 11/09/2013 0.6  0.1 - 1.0 K/uL Final  . Eosinophils Relative 11/09/2013 2  0 - 5 % Final  . Eosinophils Absolute 11/09/2013 0.1  0.0 - 0.7 K/uL Final  . Basophils Relative 11/09/2013 1  0 - 1 % Final  . Basophils Absolute 11/09/2013 0.0  0.0 - 0.1 K/uL Final  . Smear Review 11/09/2013 Criteria for review not met   Final  . Colony Count 11/09/2013 NO GROWTH   Final  . Organism ID, Bacteria 11/09/2013 NO GROWTH   Final  Orders Only on 11/09/2013  Component Date Value Ref Range Status  . Hepatitis B Surface Ag 11/09/2013 NEGATIVE  NEGATIVE Final  . HCV Ab 11/09/2013 NEGATIVE   NEGATIVE Final  . Hep B C IgM 11/09/2013 NON REACTIVE  NON REACTIVE Final   Comment: High levels of Hepatitis B Core IgM antibody are detectable                          during the acute stage of Hepatitis B. This antibody is used                          to differentiate current from past HBV infection.                             . Hep A IgM 11/09/2013 NON REACTIVE  NON REACTIVE Final   As shown above, the patient's labs reveal hepatitis. Hepatitis panel was negative. I had the patient return for [place] spotted fever titer which has not yet returned. I had the patient discontinue Cipro and I recommended pushing Gatorade and fluids for dehydration. I believe the patient had some type of viral syndrome with viral hepatitis. He is here today for follow up.  Patient looks much better today. He is well-hydrated. He is nontoxic appearing. He is not jaundice. He has no scleral icterus. Patient states he is starting to feel better. He continues to have some myalgias in his lower back. However his neck is no longer hurting he overall is feeling much better. Past Medical History  Diagnosis Date  . Hyperlipidemia   . Hypertension    No past surgical history on file. Current Outpatient Prescriptions on File Prior to Visit  Medication Sig Dispense Refill  . ALPRAZolam (XANAX) 0.5 MG tablet Take 1 tablet (0.5 mg total) by mouth at bedtime as needed for sleep.  30 tablet  0  . ciprofloxacin (CIPRO) 500 MG tablet Take 1 tablet (500 mg total) by mouth 2 (two) times daily.  20 tablet  0  . ezetimibe (ZETIA) 10 MG tablet TAKE 1 TABLET BY MOUTH EVERY DAY  30 tablet  11  . HYDROcodone-homatropine (HYCODAN) 5-1.5 MG/5ML syrup Take 5 mLs by mouth every 8 (eight) hours as needed for cough.  120 mL  0  . lisinopril (PRINIVIL,ZESTRIL) 10 MG tablet Take 10 mg by mouth daily.      . meloxicam (MOBIC) 15 MG tablet Take 1 tablet (15 mg total) by mouth daily.  30 tablet  5  . PARoxetine (PAXIL) 20 MG tablet TAKE 1  TABLET BY MOUTH EVERY DAY  30 tablet  11   No current facility-administered medications on file prior to visit.   Allergies  Allergen Reactions  . Morphine And Related   . Statins    History   Social History  . Marital Status: Married    Spouse Name: N/A    Number of Children: N/A  . Years of Education: N/A   Occupational History  . Not on  file.   Social History Main Topics  . Smoking status: Never Smoker   . Smokeless tobacco: Not on file  . Alcohol Use: Yes     Comment: occasional  . Drug Use: No  . Sexual Activity: Not on file   Other Topics Concern  . Not on file   Social History Narrative  . No narrative on file      Review of Systems  All other systems reviewed and are negative.      Objective:   Physical Exam  Vitals reviewed. Constitutional: He is oriented to person, place, and time. He appears well-developed and well-nourished. No distress.  HENT:  Right Ear: External ear normal.  Left Ear: External ear normal.  Nose: Nose normal.  Mouth/Throat: Oropharynx is clear and moist. No oropharyngeal exudate.  Eyes: Conjunctivae and EOM are normal. Pupils are equal, round, and reactive to light. No scleral icterus.  Neck: Neck supple. No JVD present. No tracheal deviation present. No thyromegaly present.  Cardiovascular: Normal rate, regular rhythm and normal heart sounds.  Exam reveals no gallop and no friction rub.   No murmur heard. Pulmonary/Chest: Effort normal and breath sounds normal. No respiratory distress. He has no wheezes. He has no rales. He exhibits no tenderness.  Abdominal: Soft. Bowel sounds are normal. He exhibits no distension and no mass. There is no tenderness. There is no rebound and no guarding.  Lymphadenopathy:    He has no cervical adenopathy.  Neurological: He is alert and oriented to person, place, and time. He has normal reflexes. No cranial nerve deficit. He exhibits normal muscle tone. Coordination normal.  Skin: Skin is  warm. No rash noted. He is not diaphoretic. No erythema. No pallor.          Assessment & Plan:  1. Hepatitis The patient had a viral syndrome with associated hepatitis. Clinically the patient is improving. A repeat CBC and CMP today. The patient's liver function tests are improving, no further followup is necessary. I like the patient to refrain from using zetia until he is completely better. - CBC with Differential - COMPLETE METABOLIC PANEL WITH GFR

## 2013-11-14 ENCOUNTER — Telehealth: Payer: Self-pay | Admitting: Family Medicine

## 2013-11-14 ENCOUNTER — Other Ambulatory Visit: Payer: Self-pay | Admitting: Family Medicine

## 2013-11-14 DIAGNOSIS — K759 Inflammatory liver disease, unspecified: Secondary | ICD-10-CM

## 2013-11-14 LAB — CBC WITH DIFFERENTIAL/PLATELET
BASOS PCT: 1 % (ref 0–1)
Basophils Absolute: 0.1 10*3/uL (ref 0.0–0.1)
Eosinophils Absolute: 0.2 10*3/uL (ref 0.0–0.7)
Eosinophils Relative: 3 % (ref 0–5)
HEMATOCRIT: 44.9 % (ref 39.0–52.0)
HEMOGLOBIN: 15.2 g/dL (ref 13.0–17.0)
Lymphocytes Relative: 33 % (ref 12–46)
Lymphs Abs: 2.7 10*3/uL (ref 0.7–4.0)
MCH: 29.7 pg (ref 26.0–34.0)
MCHC: 33.9 g/dL (ref 30.0–36.0)
MCV: 87.9 fL (ref 78.0–100.0)
MONO ABS: 0.9 10*3/uL (ref 0.1–1.0)
MONOS PCT: 11 % (ref 3–12)
Neutro Abs: 4.3 10*3/uL (ref 1.7–7.7)
Neutrophils Relative %: 52 % (ref 43–77)
Platelets: 318 10*3/uL (ref 150–400)
RBC: 5.11 MIL/uL (ref 4.22–5.81)
RDW: 13.9 % (ref 11.5–15.5)
WBC: 8.2 10*3/uL (ref 4.0–10.5)

## 2013-11-14 NOTE — Telephone Encounter (Signed)
Patient is calling about lab results [phone]

## 2013-11-23 ENCOUNTER — Other Ambulatory Visit: Payer: 59

## 2013-11-23 DIAGNOSIS — K759 Inflammatory liver disease, unspecified: Secondary | ICD-10-CM

## 2013-11-24 ENCOUNTER — Telehealth: Payer: Self-pay | Admitting: Family Medicine

## 2013-11-24 LAB — COMPLETE METABOLIC PANEL WITH GFR
ALBUMIN: 4.8 g/dL (ref 3.5–5.2)
ALT: 50 U/L (ref 0–53)
AST: 20 U/L (ref 0–37)
Alkaline Phosphatase: 84 U/L (ref 39–117)
BUN: 13 mg/dL (ref 6–23)
CO2: 27 mEq/L (ref 19–32)
Calcium: 9.4 mg/dL (ref 8.4–10.5)
Chloride: 101 mEq/L (ref 96–112)
Creat: 1.14 mg/dL (ref 0.50–1.35)
GFR, EST NON AFRICAN AMERICAN: 75 mL/min
GFR, Est African American: 87 mL/min
GLUCOSE: 82 mg/dL (ref 70–99)
POTASSIUM: 4.4 meq/L (ref 3.5–5.3)
Sodium: 139 mEq/L (ref 135–145)
TOTAL PROTEIN: 6.7 g/dL (ref 6.0–8.3)
Total Bilirubin: 0.7 mg/dL (ref 0.2–1.2)

## 2013-11-24 NOTE — Telephone Encounter (Signed)
Patient calling to get his liver results  [phone]

## 2013-11-25 NOTE — Telephone Encounter (Signed)
Call placed to patient. LMTRC.  

## 2013-12-13 ENCOUNTER — Other Ambulatory Visit: Payer: 59

## 2013-12-13 DIAGNOSIS — E785 Hyperlipidemia, unspecified: Secondary | ICD-10-CM

## 2013-12-13 LAB — LIPID PANEL
Cholesterol: 206 mg/dL — ABNORMAL HIGH (ref 0–200)
HDL: 45 mg/dL (ref >39)
LDL Cholesterol: 136 mg/dL — ABNORMAL HIGH (ref 0–99)
Total CHOL/HDL Ratio: 4.6 Ratio
Triglycerides: 125 mg/dL (ref <150)
VLDL: 25 mg/dL (ref 0–40)

## 2013-12-14 ENCOUNTER — Ambulatory Visit (INDEPENDENT_AMBULATORY_CARE_PROVIDER_SITE_OTHER): Payer: 59 | Admitting: Family Medicine

## 2013-12-14 ENCOUNTER — Encounter: Payer: Self-pay | Admitting: Family Medicine

## 2013-12-14 VITALS — BP 128/80 | HR 56 | Temp 98.1°F | Resp 16 | Ht 72.0 in | Wt 210.0 lb

## 2013-12-14 DIAGNOSIS — Z0000 Encounter for general adult medical examination without abnormal findings: Secondary | ICD-10-CM

## 2013-12-14 NOTE — Progress Notes (Signed)
Subjective:    Patient ID: Darryl Ross, male    DOB: Jun 22, 1965, 48 y.o.   MRN: [id number]  HPI Patient is here for complete physical exam. He as no concerns. Patient has been off zetia for over one month. His symptoms have completely resolved from his bowel syndrome he experienced in August. Otherwise he is doing very well. He is exercising on regular basis. He is going to try lose 15-20 pounds over the next 6 months. Patient has set a goal weight for himself on 190 pounds.  He has drastically altered his diet and lifestyle to help him achieve this weight.  I reviewed his CBC, CMP from 11/23/2013. Those labs are normal. I also reviewed his fasting lipid panel from earlier this week which was normal. His LDL was acceptable 136. His total cholesterol was acceptable at 206. Patient is due for colonoscopy once he turns 71. He is also due for a PSA once he turns48. Past Medical History  Diagnosis Date  . Hyperlipidemia   . Hypertension   . Anxiety    No past surgical history on file. Current Outpatient Prescriptions on File Prior to Visit  Medication Sig Dispense Refill  . ALPRAZolam (XANAX) 0.5 MG tablet Take 1 tablet (0.5 mg total) by mouth at bedtime as needed for sleep.  30 tablet  0  . PARoxetine (PAXIL) 20 MG tablet TAKE 1 TABLET BY MOUTH EVERY DAY  30 tablet  11  . ezetimibe (ZETIA) 10 MG tablet TAKE 1 TABLET BY MOUTH EVERY DAY  30 tablet  11   No current facility-administered medications on file prior to visit.   Allergies  Allergen Reactions  . Morphine And Related   . Statins    History   Social History  . Marital Status: Married    Spouse Name: N/A    Number of Children: N/A  . Years of Education: N/A   Occupational History  . Not on file.   Social History Main Topics  . Smoking status: Never Smoker   . Smokeless tobacco: Never Used  . Alcohol Use: Yes     Comment: occasional  . Drug Use: No  . Sexual Activity: Not on file     Comment: avid runner and cyclist.   Married.   Other Topics Concern  . Not on file   Social History Narrative  . No narrative on file   No family history on file.    Review of Systems  All other systems reviewed and are negative.      Objective:   Physical Exam  Vitals reviewed. Constitutional: He is oriented to person, place, and time. He appears well-developed and well-nourished. No distress.  HENT:  Head: Normocephalic and atraumatic.  Right Ear: External ear normal.  Left Ear: External ear normal.  Nose: Nose normal.  Mouth/Throat: Oropharynx is clear and moist. No oropharyngeal exudate.  Eyes: Conjunctivae and EOM are normal. Pupils are equal, round, and reactive to light. Right eye exhibits no discharge. Left eye exhibits no discharge. No scleral icterus.  Neck: Normal range of motion. Neck supple. No JVD present. No tracheal deviation present. No thyromegaly present.  Cardiovascular: Normal rate, regular rhythm, normal heart sounds and intact distal pulses.  Exam reveals no gallop and no friction rub.   No murmur heard. Pulmonary/Chest: Effort normal and breath sounds normal. No stridor. No respiratory distress. He has no wheezes. He has no rales. He exhibits no tenderness.  Abdominal: Soft. Bowel sounds are normal. He exhibits no  distension and no mass. There is no tenderness. There is no rebound and no guarding.  Genitourinary: Rectum normal, prostate normal and penis normal.  Musculoskeletal: Normal range of motion. He exhibits no edema and no tenderness.  Lymphadenopathy:    He has no cervical adenopathy.  Neurological: He is alert and oriented to person, place, and time. He has normal reflexes. He displays normal reflexes. No cranial nerve deficit. He exhibits normal muscle tone. Coordination normal.  Skin: Skin is warm. No rash noted. He is not diaphoretic. No erythema. No pallor.  Psychiatric: He has a normal mood and affect. His behavior is normal. Judgment and thought content normal.           Assessment & Plan:  Routine general medical examination at a health care facility  Patient's physical exam is completely normal. I like the patient to return in January for repeat fasting lipid panel after 3 months of extensive lifestyle changes. His goal LDL cholesterol is 130. He would like to try to achieve this goal without medication.  Also check a PSA in January for prostate cancer screening. His digital rectal exam however is normal today. I recommended a colonoscopy but the patient like to defer this until after the first of next year. He will call me when he wasn't scheduled. I also recommended a flu shot patient is free at work and will get it there.  Patient's tetanus shot was given to him earlier this year in August and is UTD.

## 2014-05-17 ENCOUNTER — Encounter: Payer: Self-pay | Admitting: Family Medicine

## 2014-05-17 ENCOUNTER — Ambulatory Visit (INDEPENDENT_AMBULATORY_CARE_PROVIDER_SITE_OTHER): Payer: 59 | Admitting: Family Medicine

## 2014-05-17 VITALS — BP 126/94 | HR 80 | Temp 98.2°F | Resp 16 | Ht 72.0 in | Wt 221.0 lb

## 2014-05-17 DIAGNOSIS — J069 Acute upper respiratory infection, unspecified: Secondary | ICD-10-CM

## 2014-05-17 MED ORDER — HYDROCODONE-HOMATROPINE 5-1.5 MG/5ML PO SYRP: 5.0000 mL | ORAL_SOLUTION | Freq: Three times a day (TID) | ORAL | Status: DC | PRN

## 2014-05-17 MED ORDER — AZITHROMYCIN 250 MG PO TABS: ORAL_TABLET | ORAL | Status: DC

## 2014-05-17 NOTE — Progress Notes (Signed)
   Subjective:    Patient ID: Darryl Ross, male    DOB: [birth year], 49 y.o.   MRN: [id number]  HPI Patient symptoms began Monday with nausea and diarrhea. Shortly thereafter he developed chest congestion, rhinorrhea, sinus congestion, and a scratchy throat. He's also having subjective fevers at home. He is taking Mucinex D with minimal relief. He denies any chest pain shortness of breath or dyspnea on exertion. Past Medical History  Diagnosis Date  . Hyperlipidemia   . Hypertension   . Anxiety    No past surgical history on file. Current Outpatient Prescriptions on File Prior to Visit  Medication Sig Dispense Refill  . PARoxetine (PAXIL) 20 MG tablet TAKE 1 TABLET BY MOUTH EVERY DAY 30 tablet 11   No current facility-administered medications on file prior to visit.   Allergies  Allergen Reactions  . Morphine And Related   . Statins    History   Social History  . Marital Status: Married    Spouse Name: N/A  . Number of Children: N/A  . Years of Education: N/A   Occupational History  . Not on file.   Social History Main Topics  . Smoking status: Never Smoker   . Smokeless tobacco: Never Used  . Alcohol Use: Yes     Comment: occasional  . Drug Use: No  . Sexual Activity: Not on file     Comment: avid runner and cyclist.  Married.   Other Topics Concern  . Not on file   Social History Narrative      Review of Systems  All other systems reviewed and are negative.      Objective:   Physical Exam  Constitutional: He appears well-developed and well-nourished.  HENT:  Nose: Nose normal.  Mouth/Throat: Oropharynx is clear and moist. No oropharyngeal exudate.  Eyes: Conjunctivae are normal.  Neck: Neck supple.  Cardiovascular: Normal rate, regular rhythm and normal heart sounds.   Pulmonary/Chest: Effort normal and breath sounds normal. No respiratory distress. He has no wheezes. He has no rales.  Abdominal: Soft. Bowel sounds are normal.  Lymphadenopathy:      He has no cervical adenopathy.  Vitals reviewed.         Assessment & Plan:  Acute URI - Plan: azithromycin (ZITHROMAX) 250 MG tablet, HYDROcodone-homatropine (HYCODAN) 5-1.5 MG/5ML syrup  A she has never respiratory infection which I believe is due to a virus. I recommended tincture of time. Continue Mucinex D. Patient can also use Hycodan 1 teaspoon every 8 hours as needed for cough. I explained to the patient an antibiotic will not help with a virus. Ideally give the patient a Z-Pak with strict instructions not to fill unless he develops a high fever, purulent mucus and sputum, chest pain, and/or shortness of breath.

## 2014-08-01 ENCOUNTER — Other Ambulatory Visit: Payer: Self-pay | Admitting: Family Medicine

## 2014-11-28 ENCOUNTER — Other Ambulatory Visit: Payer: 59

## 2014-11-28 DIAGNOSIS — Z0000 Encounter for general adult medical examination without abnormal findings: Secondary | ICD-10-CM

## 2014-11-28 DIAGNOSIS — E785 Hyperlipidemia, unspecified: Secondary | ICD-10-CM

## 2014-11-28 DIAGNOSIS — Z125 Encounter for screening for malignant neoplasm of prostate: Secondary | ICD-10-CM

## 2014-11-28 DIAGNOSIS — Z79899 Other long term (current) drug therapy: Secondary | ICD-10-CM

## 2014-11-28 DIAGNOSIS — I10 Essential (primary) hypertension: Secondary | ICD-10-CM

## 2014-11-28 LAB — CBC WITH DIFFERENTIAL/PLATELET
BASOS ABS: 0.1 10*3/uL (ref 0.0–0.1)
BASOS PCT: 1 % (ref 0–1)
Eosinophils Absolute: 0.1 10*3/uL (ref 0.0–0.7)
Eosinophils Relative: 2 % (ref 0–5)
HEMATOCRIT: 46.2 % (ref 39.0–52.0)
HEMOGLOBIN: 16.1 g/dL (ref 13.0–17.0)
LYMPHS PCT: 25 % (ref 12–46)
Lymphs Abs: 1.3 10*3/uL (ref 0.7–4.0)
MCH: 31.1 pg (ref 26.0–34.0)
MCHC: 34.8 g/dL (ref 30.0–36.0)
MCV: 89.2 fL (ref 78.0–100.0)
MPV: 9.8 fL (ref 8.6–12.4)
Monocytes Absolute: 0.5 10*3/uL (ref 0.1–1.0)
Monocytes Relative: 9 % (ref 3–12)
NEUTROS ABS: 3.2 10*3/uL (ref 1.7–7.7)
Neutrophils Relative %: 63 % (ref 43–77)
Platelets: 247 10*3/uL (ref 150–400)
RBC: 5.18 MIL/uL (ref 4.22–5.81)
RDW: 13.8 % (ref 11.5–15.5)
WBC: 5.1 10*3/uL (ref 4.0–10.5)

## 2014-11-28 LAB — COMPLETE METABOLIC PANEL WITH GFR
ALBUMIN: 4.6 g/dL (ref 3.6–5.1)
ALK PHOS: 44 U/L (ref 40–115)
ALT: 28 U/L (ref 9–46)
AST: 21 U/L (ref 10–35)
BUN: 13 mg/dL (ref 7–25)
CALCIUM: 9.3 mg/dL (ref 8.6–10.3)
CHLORIDE: 99 mmol/L (ref 98–110)
CO2: 28 mmol/L (ref 20–31)
CREATININE: 0.97 mg/dL (ref 0.70–1.33)
GFR, Est Non African American: >89 mL/min (ref >=60)
Glucose, Bld: 91 mg/dL (ref 70–99)
Potassium: 4.4 mmol/L (ref 3.5–5.3)
Sodium: 137 mmol/L (ref 135–146)
Total Bilirubin: 0.9 mg/dL (ref 0.2–1.2)
Total Protein: 6.6 g/dL (ref 6.1–8.1)

## 2014-11-28 LAB — LIPID PANEL
CHOLESTEROL: 267 mg/dL — AB (ref 125–200)
HDL: 48 mg/dL (ref >=40)
LDL CALC: 185 mg/dL — AB (ref <130)
TRIGLYCERIDES: 168 mg/dL — AB (ref <150)
Total CHOL/HDL Ratio: 5.6 Ratio — ABNORMAL HIGH (ref <=5.0)
VLDL: 34 mg/dL — AB (ref <30)

## 2014-11-28 LAB — TSH: TSH: 2.449 u[IU]/mL (ref 0.350–4.500)

## 2014-11-29 LAB — PSA: PSA: 0.33 ng/mL (ref <=4.00)

## 2014-12-03 ENCOUNTER — Ambulatory Visit (INDEPENDENT_AMBULATORY_CARE_PROVIDER_SITE_OTHER): Payer: 59 | Admitting: Family Medicine

## 2014-12-03 ENCOUNTER — Encounter: Payer: Self-pay | Admitting: Family Medicine

## 2014-12-03 VITALS — BP 138/94 | HR 72 | Temp 98.0°F | Resp 14 | Ht 72.0 in | Wt 213.0 lb

## 2014-12-03 DIAGNOSIS — Z0000 Encounter for general adult medical examination without abnormal findings: Secondary | ICD-10-CM

## 2014-12-03 DIAGNOSIS — I10 Essential (primary) hypertension: Secondary | ICD-10-CM

## 2014-12-03 MED ORDER — LISINOPRIL 10 MG PO TABS: 10.0000 mg | ORAL_TABLET | Freq: Every day | ORAL | Status: DC

## 2014-12-03 MED ORDER — ALPRAZOLAM 0.5 MG PO TABS: 0.5000 mg | ORAL_TABLET | Freq: Every evening | ORAL | Status: DC | PRN

## 2014-12-03 MED ORDER — PAROXETINE HCL 20 MG PO TABS: 20.0000 mg | ORAL_TABLET | Freq: Every day | ORAL | Status: DC

## 2014-12-03 NOTE — Progress Notes (Signed)
Subjective:    Patient ID: Darryl Ross, male    DOB: 25-Mar-1966, 49 y.o.   MRN: [id number]  HPI  Patient is here for complete physical exam. He as no concerns. Patient is due for a colonoscopy although he would prefer to wait until after October. He is due for digital rectal exam. He is due for a PSA. His most recent lab work as listed below. He admits that he has not been exercising recently and has gained a little bit of weight. His blood pressure is now elevated to 138/94 and his LDL cholesterol has jumped dramatically. However he has had elevated liver function tests on every statin he is tried. He also had problems taking Zetia. Lab on 11/28/2014  Component Date Value Ref Range Status  . PSA 11/28/2014 0.33  <=4.00 ng/mL Final   Comment: Test Methodology: ECLIA PSA (Electrochemiluminescence Immunoassay)   For PSA values from 2.5-4.0, particularly in younger men <33 years old, the AUA and NCCN suggest testing for % Free PSA (3515) and evaluation of the rate of increase in PSA (PSA velocity).   Footnotes:  (1) ** Please note change in unit of measure and reference range(s). **     . WBC 11/28/2014 5.1  4.0 - 10.5 K/uL Final  . RBC 11/28/2014 5.18  4.22 - 5.81 MIL/uL Final  . Hemoglobin 11/28/2014 16.1  13.0 - 17.0 g/dL Final  . HCT 11/28/2014 46.2  39.0 - 52.0 % Final  . MCV 11/28/2014 89.2  78.0 - 100.0 fL Final  . MCH 11/28/2014 31.1  26.0 - 34.0 pg Final  . MCHC 11/28/2014 34.8  30.0 - 36.0 g/dL Final  . RDW 11/28/2014 13.8  11.5 - 15.5 % Final  . Platelets 11/28/2014 247  150 - 400 K/uL Final  . MPV 11/28/2014 9.8  8.6 - 12.4 fL Final  . Neutrophils Relative % 11/28/2014 63  43 - 77 % Final  . Neutro Abs 11/28/2014 3.2  1.7 - 7.7 K/uL Final  . Lymphocytes Relative 11/28/2014 25  12 - 46 % Final  . Lymphs Abs 11/28/2014 1.3  0.7 - 4.0 K/uL Final  . Monocytes Relative 11/28/2014 9  3 - 12 % Final  . Monocytes Absolute 11/28/2014 0.5  0.1 - 1.0 K/uL Final  . Eosinophils  Relative 11/28/2014 2  0 - 5 % Final  . Eosinophils Absolute 11/28/2014 0.1  0.0 - 0.7 K/uL Final  . Basophils Relative 11/28/2014 1  0 - 1 % Final  . Basophils Absolute 11/28/2014 0.1  0.0 - 0.1 K/uL Final  . Smear Review 11/28/2014 Criteria for review not met   Final  . Cholesterol 11/28/2014 267* 125 - 200 mg/dL Final  . Triglycerides 11/28/2014 168* <150 mg/dL Final  . HDL 11/28/2014 48  >=40 mg/dL Final  . Total CHOL/HDL Ratio 11/28/2014 5.6* <=5.0 Ratio Final  . VLDL 11/28/2014 34* <30 mg/dL Final  . LDL Cholesterol 11/28/2014 185* <130 mg/dL Final   Comment:   Total Cholesterol/HDL Ratio:CHD Risk                        Coronary Heart Disease Risk Table                                        Men       Women  1/2 Average Risk              3.4        3.3              Average Risk              5.0        4.4           2X Average Risk              9.6        7.1           3X Average Risk             23.4       11.0 Use the calculated Patient Ratio above and the CHD Risk table  to determine the patient's CHD Risk.   Marland Kitchen TSH 11/28/2014 2.449  0.350 - 4.500 uIU/mL Final  . Sodium 11/28/2014 137  135 - 146 mmol/L Final  . Potassium 11/28/2014 4.4  3.5 - 5.3 mmol/L Final  . Chloride 11/28/2014 99  98 - 110 mmol/L Final  . CO2 11/28/2014 28  20 - 31 mmol/L Final  . Glucose, Bld 11/28/2014 91  70 - 99 mg/dL Final  . BUN 11/28/2014 13  7 - 25 mg/dL Final  . Creat 11/28/2014 0.97  0.70 - 1.33 mg/dL Final  . Total Bilirubin 11/28/2014 0.9  0.2 - 1.2 mg/dL Final  . Alkaline Phosphatase 11/28/2014 44  40 - 115 U/L Final  . AST 11/28/2014 21  10 - 35 U/L Final  . ALT 11/28/2014 28  9 - 46 U/L Final  . Total Protein 11/28/2014 6.6  6.1 - 8.1 g/dL Final  . Albumin 11/28/2014 4.6  3.6 - 5.1 g/dL Final  . Calcium 11/28/2014 9.3  8.6 - 10.3 mg/dL Final  . GFR, Est African American 11/28/2014 >89  >=60 mL/min Final  . GFR, Est Non African American 11/28/2014 >89  >=60 mL/min Final    Comment:   The estimated GFR is a calculation valid for adults (>=37 years old) that uses the CKD-EPI algorithm to adjust for age and sex. It is   not to be used for children, pregnant women, hospitalized patients,    patients on dialysis, or with rapidly changing kidney function. According to the NKDEP, eGFR >89 is normal, 60-89 shows mild impairment, 30-59 shows moderate impairment, 15-29 shows severe impairment and <15 is ESRD.       Past Medical History  Diagnosis Date  . Hyperlipidemia   . Hypertension   . Anxiety    No past surgical history on file. No current outpatient prescriptions on file prior to visit.   No current facility-administered medications on file prior to visit.   Allergies  Allergen Reactions  . Morphine And Related   . Statins    Social History   Social History  . Marital Status: Married    Spouse Name: N/A  . Number of Children: N/A  . Years of Education: N/A   Occupational History  . Not on file.   Social History Main Topics  . Smoking status: Never Smoker   . Smokeless tobacco: Never Used  . Alcohol Use: Yes     Comment: occasional  . Drug Use: No  . Sexual Activity: Not on file     Comment: avid runner and cyclist.  Married.   Other Topics Concern  . Not on file   Social History Narrative  No family history on file.    Review of Systems  All other systems reviewed and are negative.      Objective:   Physical Exam  Constitutional: He is oriented to person, place, and time. He appears well-developed and well-nourished. No distress.  HENT:  Head: Normocephalic and atraumatic.  Right Ear: External ear normal.  Left Ear: External ear normal.  Nose: Nose normal.  Mouth/Throat: Oropharynx is clear and moist. No oropharyngeal exudate.  Eyes: Conjunctivae and EOM are normal. Pupils are equal, round, and reactive to light. Right eye exhibits no discharge. Left eye exhibits no discharge. No scleral icterus.  Neck: Normal range  of motion. Neck supple. No JVD present. No tracheal deviation present. No thyromegaly present.  Cardiovascular: Normal rate, regular rhythm, normal heart sounds and intact distal pulses.  Exam reveals no gallop and no friction rub.   No murmur heard. Pulmonary/Chest: Effort normal and breath sounds normal. No stridor. No respiratory distress. He has no wheezes. He has no rales. He exhibits no tenderness.  Abdominal: Soft. Bowel sounds are normal. He exhibits no distension and no mass. There is no tenderness. There is no rebound and no guarding.  Genitourinary: Rectum normal, prostate normal and penis normal.  Musculoskeletal: Normal range of motion. He exhibits no edema or tenderness.  Lymphadenopathy:    He has no cervical adenopathy.  Neurological: He is alert and oriented to person, place, and time. He has normal reflexes. No cranial nerve deficit. He exhibits normal muscle tone. Coordination normal.  Skin: Skin is warm. No rash noted. He is not diaphoretic. No erythema. No pallor.  Psychiatric: He has a normal mood and affect. His behavior is normal. Judgment and thought content normal.  Vitals reviewed.         Assessment & Plan:  Benign essential HTN - Plan: lisinopril (PRINIVIL,ZESTRIL) 10 MG tablet  Routine general medical examination at a health care facility  I recommended a colonoscopy. Patient will call me when he wants to schedule this. Prostate exam today is normal. Lab work is excellent except for his cholesterol. He would like to try aggressive lifestyle changes including diet exercise and weight loss and then recheck a fasting lipid panel in 3 months. His blood pressures elevated. I recommended resuming lisinopril 10 mg by mouth daily and rechecking in one month

## 2015-01-04 ENCOUNTER — Other Ambulatory Visit: Payer: Self-pay | Admitting: Family Medicine

## 2015-01-04 NOTE — Telephone Encounter (Signed)
Medication called to pharmacy. 

## 2015-01-04 NOTE — Telephone Encounter (Signed)
ok 

## 2015-01-04 NOTE — Telephone Encounter (Signed)
Ok to refill??  Last office visit/ refill 12/03/2014.

## 2015-02-25 ENCOUNTER — Telehealth: Payer: Self-pay | Admitting: Family Medicine

## 2015-02-25 DIAGNOSIS — Z1211 Encounter for screening for malignant neoplasm of colon: Secondary | ICD-10-CM

## 2015-02-25 NOTE — Telephone Encounter (Signed)
Pt wants to have his Colonoscopy done now.  Is asking if we can scheduled at a [place] location for January 2.  If not that day try for the 2nd, 3rd or 4th Monday on January or February.  Referral initiated.

## 2015-02-25 NOTE — Telephone Encounter (Signed)
Ok with referral. 

## 2015-02-26 ENCOUNTER — Other Ambulatory Visit: Payer: Self-pay

## 2015-02-26 ENCOUNTER — Telehealth: Payer: Self-pay | Admitting: Gastroenterology

## 2015-02-26 NOTE — Telephone Encounter (Signed)
No authorization required for CPT code [phone] at an Ambulatory surgical center for screening colonoscopy per Ellie at [facility] ref# [id number].

## 2015-02-26 NOTE — Telephone Encounter (Signed)
Gastroenterology Pre-Procedure Review  Request Date:  Requesting Physician: Dr.   PATIENT REVIEW QUESTIONS: The patient responded to the following health history questions as indicated:    1. Are you having any GI issues? no 2. Do you have a personal history of Polyps? no 3. Do you have a family history of Colon Cancer or Polyps? no 4. Diabetes Mellitus? no 5. Joint replacements in the past 12 months?no 6. Major health problems in the past 3 months?no 7. Any artificial heart valves, MVP, or defibrillator?no    MEDICATIONS & ALLERGIES:    Patient reports the following regarding taking any anticoagulation/antiplatelet therapy:   Plavix, Coumadin, Eliquis, Xarelto, Lovenox, Pradaxa, Brilinta, or Effient? no Aspirin? YES  Patient confirms/reports the following medications:  Current Outpatient Prescriptions  Medication Sig Dispense Refill   ALPRAZolam (XANAX) 0.5 MG tablet TAKE ONE TABLET BY MOUTH AT BEDTIME A S NEEDED FOR SLEEP 30 tablet 0   lisinopril (PRINIVIL,ZESTRIL) 10 MG tablet Take 1 tablet (10 mg total) by mouth daily. 30 tablet 5   PARoxetine (PAXIL) 20 MG tablet Take 1 tablet (20 mg total) by mouth daily. 30 tablet 11   No current facility-administered medications for this visit.    Patient confirms/reports the following allergies:  Allergies  Allergen Reactions   Morphine And Related    Statins     No orders of the defined types were placed in this encounter.    AUTHORIZATION INFORMATION Primary Insurance: 1D#: Group #:  Secondary Insurance: 1D#: Group #:  SCHEDULE INFORMATION: Date:  Time: Location:

## 2015-02-27 ENCOUNTER — Encounter: Payer: Self-pay | Admitting: *Deleted

## 2015-02-27 NOTE — Discharge Instructions (Signed)

## 2015-03-04 ENCOUNTER — Other Ambulatory Visit: Payer: Self-pay | Admitting: Gastroenterology

## 2015-03-04 ENCOUNTER — Ambulatory Visit
Admission: RE | Admit: 2015-03-04 | Discharge: 2015-03-04 | Disposition: A | Discharge status: Home / Self Care | Payer: 59 | Source: Ambulatory Visit | Attending: Gastroenterology | Admitting: Gastroenterology

## 2015-03-04 ENCOUNTER — Ambulatory Visit: Payer: 59 | Admitting: Anesthesiology

## 2015-03-04 ENCOUNTER — Ambulatory Visit: Payer: 59 | Admitting: Family Medicine

## 2015-03-04 ENCOUNTER — Encounter: Payer: Self-pay | Admitting: Anesthesiology

## 2015-03-04 ENCOUNTER — Encounter
Admission: RE | Disposition: A | Discharge status: Home / Self Care | Payer: Self-pay | Source: Ambulatory Visit | Attending: Gastroenterology

## 2015-03-04 DIAGNOSIS — Z7982 Long term (current) use of aspirin: Secondary | ICD-10-CM | POA: Insufficient documentation

## 2015-03-04 DIAGNOSIS — Z885 Allergy status to narcotic agent status: Secondary | ICD-10-CM | POA: Insufficient documentation

## 2015-03-04 DIAGNOSIS — M5030 Other cervical disc degeneration, unspecified cervical region: Secondary | ICD-10-CM | POA: Diagnosis not present

## 2015-03-04 DIAGNOSIS — E785 Hyperlipidemia, unspecified: Secondary | ICD-10-CM | POA: Insufficient documentation

## 2015-03-04 DIAGNOSIS — E7800 Pure hypercholesterolemia, unspecified: Secondary | ICD-10-CM | POA: Diagnosis not present

## 2015-03-04 DIAGNOSIS — M1990 Unspecified osteoarthritis, unspecified site: Secondary | ICD-10-CM | POA: Diagnosis not present

## 2015-03-04 DIAGNOSIS — Z79899 Other long term (current) drug therapy: Secondary | ICD-10-CM | POA: Insufficient documentation

## 2015-03-04 DIAGNOSIS — Z888 Allergy status to other drugs, medicaments and biological substances status: Secondary | ICD-10-CM | POA: Diagnosis not present

## 2015-03-04 DIAGNOSIS — Z9889 Other specified postprocedural states: Secondary | ICD-10-CM | POA: Diagnosis not present

## 2015-03-04 DIAGNOSIS — F419 Anxiety disorder, unspecified: Secondary | ICD-10-CM | POA: Insufficient documentation

## 2015-03-04 DIAGNOSIS — Z1211 Encounter for screening for malignant neoplasm of colon: Secondary | ICD-10-CM

## 2015-03-04 DIAGNOSIS — I10 Essential (primary) hypertension: Secondary | ICD-10-CM | POA: Diagnosis not present

## 2015-03-04 HISTORY — DX: Other cervical disc degeneration, unspecified cervical region: M50.30

## 2015-03-04 HISTORY — DX: Unspecified osteoarthritis, unspecified site: M19.90

## 2015-03-04 HISTORY — DX: Pure hypercholesterolemia, unspecified: E78.00

## 2015-03-04 HISTORY — PX: COLONOSCOPY WITH PROPOFOL: SHX5780

## 2015-03-04 SURGERY — COLONOSCOPY WITH PROPOFOL
Anesthesia: Monitor Anesthesia Care | Wound class: Clean Contaminated

## 2015-03-04 MED ORDER — LACTATED RINGERS IV SOLN
INTRAVENOUS | Status: DC
  Administered 2015-03-04 (×2): via INTRAVENOUS

## 2015-03-04 MED ORDER — SIMETHICONE 40 MG/0.6ML PO SUSP
ORAL | Status: DC | PRN
  Administered 2015-03-04: 200 mL

## 2015-03-04 MED ORDER — GLYCOPYRROLATE 0.2 MG/ML IJ SOLN
0.2000 mg | Freq: Once | INTRAMUSCULAR | Status: AC
  Administered 2015-03-04: 0.2 mg via INTRAVENOUS

## 2015-03-04 MED ORDER — PROPOFOL 10 MG/ML IV BOLUS
INTRAVENOUS | Status: DC | PRN
  Administered 2015-03-04 (×8): 20 mg via INTRAVENOUS

## 2015-03-04 MED ORDER — EPHEDRINE 5 MG/ML INJ
5.0000 mg | Freq: Once | INTRAVENOUS | Status: AC
  Administered 2015-03-04: 5 mg via INTRAVENOUS

## 2015-03-04 SURGICAL SUPPLY — 28 items
CANISTER SUCT 1200ML W/VALVE (MISCELLANEOUS) ×2 IMPLANT
FCP ESCP3.2XJMB 240X2.8X (MISCELLANEOUS)
FORCEPS BIOP RAD 4 LRG CAP 4 (CUTTING FORCEPS) ×1 IMPLANT
FORCEPS BIOP RJ4 240 W/NDL (MISCELLANEOUS)
FORCEPS ESCP3.2XJMB 240X2.8X (MISCELLANEOUS) IMPLANT
GOWN CVR UNV OPN BCK APRN NK (MISCELLANEOUS) ×2 IMPLANT
GOWN ISOL THUMB LOOP REG UNIV (MISCELLANEOUS) ×4
HEMOCLIP INSTINCT (CLIP) IMPLANT
INJECTOR VARIJECT VIN23 (MISCELLANEOUS) IMPLANT
KIT CO2 TUBING (TUBING) ×1 IMPLANT
KIT DEFENDO VALVE AND CONN (KITS) IMPLANT
KIT ENDO PROCEDURE OLY (KITS) ×2 IMPLANT
LIGATOR MULTIBAND 6SHOOTER MBL (MISCELLANEOUS) IMPLANT
MARKER SPOT ENDO TATTOO 5ML (MISCELLANEOUS) IMPLANT
PAD GROUND ADULT SPLIT (MISCELLANEOUS) IMPLANT
SNARE SHORT THROW 13M SML OVAL (MISCELLANEOUS) IMPLANT
SNARE SHORT THROW 30M LRG OVAL (MISCELLANEOUS) IMPLANT
SPOT EX ENDOSCOPIC TATTOO (MISCELLANEOUS)
SUCTION POLY TRAP 4CHAMBER (MISCELLANEOUS) IMPLANT
TRAP SUCTION POLY (MISCELLANEOUS) IMPLANT
TUBING CONN 6MMX3.1M (TUBING)
TUBING SUCTION CONN 0.25 STRL (TUBING) IMPLANT
UNDERPAD 30X60 958B10 (PK) (MISCELLANEOUS) IMPLANT
VALVE BIOPSY ENDO (VALVE) IMPLANT
VARIJECT INJECTOR VIN23 (MISCELLANEOUS)
WATER AUXILLARY (MISCELLANEOUS) IMPLANT
WATER STERILE IRR 250ML POUR (IV SOLUTION) ×2 IMPLANT
WATER STERILE IRR 500ML POUR (IV SOLUTION) IMPLANT

## 2015-03-04 NOTE — Op Note (Signed)
[facility] Gastroenterology Patient Name: Darryl Ross Procedure Date: 03/04/2015 10:02 AM MRN: [id number] Account #: [id number] Date of Birth: 1966-01-24 Admit Type: Outpatient Age: 49 Room: [facility] OR ROOM 01 Gender: Male Note Status: Finalized Procedure:         Colonoscopy Indications:       Screening for colorectal malignant neoplasm Providers:         Lucilla Lame, MD Referring MD:      Cammie Mcgee. Dennard Schaumann, MD (Referring MD) Medicines:         Propofol per Anesthesia Complications:     No immediate complications. Procedure:         Pre-Anesthesia Assessment:                    - Prior to the procedure, a History and Physical was                     performed, and patient medications and allergies were                     reviewed. The patient's tolerance of previous anesthesia                     was also reviewed. The risks and benefits of the procedure                     and the sedation options and risks were discussed with the                     patient. All questions were answered, and informed consent                     was obtained. Prior Anticoagulants: The patient has taken                     no previous anticoagulant or antiplatelet agents. ASA                     Grade Assessment: II - A patient with mild systemic                     disease. After reviewing the risks and benefits, the                     patient was deemed in satisfactory condition to undergo                     the procedure.                    After obtaining informed consent, the colonoscope was                     passed under direct vision. Throughout the procedure, the                     patient's blood pressure, pulse, and oxygen saturations                     were monitored continuously. The was introduced through                     the anus and advanced to the the cecum, identified by  appendiceal orifice and ileocecal valve. The colonoscopy                 was performed without difficulty. The patient tolerated                     the procedure well. The quality of the bowel preparation                     was excellent. Findings:      The perianal and digital rectal examinations were normal.      A diffuse area of moderately erythematous mucosa was found in the entire       colon. Random biopsies were obtained with cold forceps for histology       randomly in the entire colon.      The terminal ileum appeared normal. This was biopsied with a cold       forceps for histology. Impression:        - Erythematous mucosa in the entire examined colon.                    - No specimens collected. Recommendation:    - Await pathology results. Procedure Code(s): --- Professional ---                    [phone], Colonoscopy, flexible; with biopsy, single or                     multiple Diagnosis Code(s): --- Professional ---                    Z12.11, Encounter for screening for malignant neoplasm of                     colon CPT copyright 2014 American Medical Association. All rights reserved. The codes documented in this report are preliminary and upon coder review may  be revised to meet current compliance requirements. Lucilla Lame, MD 03/04/2015 10:22:36 AM This report has been signed electronically. Number of Addenda: 0 Note Initiated On: 03/04/2015 10:02 AM Scope Withdrawal Time: 0 hours 5 minutes 32 seconds  Total Procedure Duration: 0 hours 9 minutes 3 seconds       [facility]

## 2015-03-04 NOTE — Anesthesia Postprocedure Evaluation (Signed)
Anesthesia Post Note  Patient: Darryl Ross  Procedure(s) Performed: Procedure(s) (LRB): COLONOSCOPY WITH PROPOFOL (N/A)  Patient location during evaluation: PACU Anesthesia Type: MAC Level of consciousness: awake and alert Pain management: pain level controlled Vital Signs Assessment: post-procedure vital signs reviewed and stable Respiratory status: spontaneous breathing, nonlabored ventilation, respiratory function stable and patient connected to nasal cannula oxygen Cardiovascular status: stable and blood pressure returned to baseline Anesthetic complications: no    Aslynn Brunetti

## 2015-03-04 NOTE — H&P (Signed)
  [facility]  [address] [place], [place] [address] Phone: [phone] Fax : [phone]  Primary Care Physician:  Odette Fraction, MD Primary Gastroenterologist:  Dr. Allen Norris  Pre-Procedure History & Physical: HPI:  Darryl Ross is a 49 y.o. male is here for a screening colonoscopy.   Past Medical History  Diagnosis Date  . Hyperlipidemia   . Anxiety   . Hypercholesteremia   . Degenerative disc disease, cervical     no limitations  . Arthritis     hands, neck  . Hypertension     neg stress test several yrs ago    Past Surgical History  Procedure Laterality Date  . Hand surgery      fracture repair    Prior to Admission medications   Medication Sig Start Date End Date Taking? Authorizing Provider  ALPRAZolam Duanne Moron) 0.5 MG tablet TAKE ONE TABLET BY MOUTH AT BEDTIME A S NEEDED FOR SLEEP 01/04/15  Yes Susy Frizzle, MD  aspirin 81 MG tablet Take 81 mg by mouth daily.   Yes Historical Provider, MD  Glucosamine-Chondroitin (OSTEO BI-FLEX REGULAR STRENGTH PO) Take 2 tablets by mouth daily.   Yes Historical Provider, MD  lisinopril (PRINIVIL,ZESTRIL) 10 MG tablet Take 1 tablet (10 mg total) by mouth daily. 12/03/14  Yes Susy Frizzle, MD  Multiple Vitamin (MULTIVITAMIN) tablet Take 1 tablet by mouth daily.   Yes Historical Provider, MD  Omega-3 Fatty Acids (FISH OIL) 1000 MG CAPS Take 2,000 mg by mouth daily.   Yes Historical Provider, MD  PARoxetine (PAXIL) 20 MG tablet Take 1 tablet (20 mg total) by mouth daily. 12/03/14  Yes Susy Frizzle, MD    Allergies as of 02/26/2015 - Review Complete 12/03/2014  Allergen Reaction Noted  . Morphine and related  10/28/2012  . Statins  10/28/2012    History reviewed. No pertinent family history.  Social History   Social History  . Marital Status: Married    Spouse Name: N/A  . Number of Children: N/A  . Years of Education: N/A   Occupational History  . Not on file.   Social History Main  Topics  . Smoking status: Never Smoker   . Smokeless tobacco: Never Used  . Alcohol Use: 2.4 oz/week    3 Cans of beer, 1 Shots of liquor per week     Comment: occasional  . Drug Use: No  . Sexual Activity: Not on file     Comment: avid runner and cyclist.  Married.   Other Topics Concern  . Not on file   Social History Narrative    Review of Systems: See HPI, otherwise negative ROS  Physical Exam: BP 107/60 mmHg  Pulse 70  Temp(Src) 97.9 F (36.6 C) (Temporal)  Resp 11  Ht 6' (1.829 m)  Wt 210 lb (95.255 kg)  BMI 28.47 kg/m2  SpO2 100% General:   Alert,  pleasant and cooperative in NAD Head:  Normocephalic and atraumatic. Neck:  Supple; no masses or thyromegaly. Lungs:  Clear throughout to auscultation.    Heart:  Regular rate and rhythm. Abdomen:  Soft, nontender and nondistended. Normal bowel sounds, without guarding, and without rebound.   Neurologic:  Alert and  oriented x4;  grossly normal neurologically.  Impression/Plan: Darryl Ross is now here to undergo a screening colonoscopy.  Risks, benefits, and alternatives regarding colonoscopy have been reviewed with the patient.  Questions have been answered.  All parties agreeable.

## 2015-03-04 NOTE — Anesthesia Preprocedure Evaluation (Signed)
Anesthesia Evaluation  Patient identified by MRN, date of birth, ID band  Reviewed: NPO status   History of Anesthesia Complications Negative for: history of anesthetic complications  Airway Mallampati: II  TM Distance: >3 FB Neck ROM: full    Dental no notable dental hx.    Pulmonary neg pulmonary ROS,    Pulmonary exam normal        Cardiovascular Exercise Tolerance: Good hypertension, Normal cardiovascular exam     Neuro/Psych Anxiety negative neurological ROS  negative psych ROS   GI/Hepatic negative GI ROS, Neg liver ROS,   Endo/Other  negative endocrine ROS  Renal/GU negative Renal ROS  negative genitourinary   Musculoskeletal  (+) Arthritis , Osteoarthritis,    Abdominal   Peds  Hematology negative hematology ROS (+)   Anesthesia Other Findings In preop holding, pt had vagal episode (hr=35, sbp=69) during IV placement. Tx with IVF, glycopyrolate and ephedrine.  VSS now. R/B/A d/w pt and family.  Pt wishes to proceed with procedure today.  Reproductive/Obstetrics                             Anesthesia Physical Anesthesia Plan  ASA: II  Anesthesia Plan: MAC   Post-op Pain Management:    Induction:   Airway Management Planned:   Additional Equipment:   Intra-op Plan:   Post-operative Plan:   Informed Consent: I have reviewed the patients History and Physical, chart, labs and discussed the procedure including the risks, benefits and alternatives for the proposed anesthesia with the patient or authorized representative who has indicated his/her understanding and acceptance.     Plan Discussed with: CRNA  Anesthesia Plan Comments:         Anesthesia Quick Evaluation

## 2015-03-04 NOTE — Anesthesia Procedure Notes (Signed)
Procedure Name: MAC Performed by: Nat Christen Pre-anesthesia Checklist: Patient identified, Emergency Drugs available, Suction available, Patient being monitored and Timeout performed Patient Re-evaluated:Patient Re-evaluated prior to inductionOxygen Delivery Method: Nasal cannula Preoxygenation: Pre-oxygenation with 100% oxygen Intubation Type: IV induction

## 2015-03-04 NOTE — Progress Notes (Signed)
During insertion of IV, patient began to shake and loss consciousness.  Color ashen and diapharetic, HR 30, unable to arouse times 30 seconds.  MD to bedside, new orders received. Medication given per CRNA. Rhythm sinus bradycardiac at 30, BP 75/33. Will continue to monitor.

## 2015-03-04 NOTE — Transfer of Care (Signed)
Immediate Anesthesia Transfer of Care Note  Patient: Darryl Ross  Procedure(s) Performed: Procedure(s): COLONOSCOPY WITH PROPOFOL (N/A)  Patient Location: PACU  Anesthesia Type: MAC  Level of Consciousness: awake, alert  and patient cooperative  Airway and Oxygen Therapy: Patient Spontanous Breathing and Patient connected to supplemental oxygen  Post-op Assessment: Post-op Vital signs reviewed, Patient's Cardiovascular Status Stable, Respiratory Function Stable, Patent Airway and No signs of Nausea or vomiting  Post-op Vital Signs: Reviewed and stable  Complications: No apparent anesthesia complications

## 2015-03-05 ENCOUNTER — Other Ambulatory Visit: Payer: Self-pay | Admitting: Family Medicine

## 2015-03-05 ENCOUNTER — Encounter: Payer: Self-pay | Admitting: Gastroenterology

## 2015-03-05 MED ORDER — PAROXETINE HCL 20 MG PO TABS: 20.0000 mg | ORAL_TABLET | Freq: Every day | ORAL | Status: DC

## 2015-03-06 ENCOUNTER — Telehealth: Payer: Self-pay | Admitting: Family Medicine

## 2015-03-06 ENCOUNTER — Telehealth: Payer: Self-pay

## 2015-03-06 ENCOUNTER — Ambulatory Visit (INDEPENDENT_AMBULATORY_CARE_PROVIDER_SITE_OTHER): Payer: 59 | Admitting: Physician Assistant

## 2015-03-06 ENCOUNTER — Encounter: Payer: Self-pay | Admitting: Physician Assistant

## 2015-03-06 VITALS — BP 144/96 | HR 68 | Temp 98.5°F | Resp 18 | Wt 210.0 lb

## 2015-03-06 DIAGNOSIS — R197 Diarrhea, unspecified: Secondary | ICD-10-CM

## 2015-03-06 DIAGNOSIS — R1084 Generalized abdominal pain: Secondary | ICD-10-CM | POA: Diagnosis not present

## 2015-03-06 NOTE — Telephone Encounter (Signed)
Spoke with pt's wife and she stated her husband works in [place] and cannot come back to [facility] to discuss results. She has requested I contact Mr. Blankenhorn primary care doctor and have her to prescribe medication needed. She only wants his care to be continued through PCP. I have messaged Dena Billet, [place] regarding her request.  Waiting for a response.

## 2015-03-06 NOTE — Telephone Encounter (Signed)
-----   Message from Lucilla Lame, MD sent at 03/06/2015  7:56 AM EST ----- Please have the patient come in for a follow up.

## 2015-03-06 NOTE — Telephone Encounter (Addendum)
Wife called to report GI problem.  She called GI (Dr Allen Norris).  They are going to send you an updated report that tells about his diverticulitis so you can order him something.

## 2015-03-06 NOTE — Progress Notes (Addendum)
Patient ID: TIYON ROORDA MRN: [id number], DOB: 05-31-1965, 49 y.o. Date of Encounter: @DATE @  Chief Complaint:  Chief Complaint  Patient presents with  . GI issues    wants something for his diverticulitis    HPI: 49 y.o. year old white male  presents with the following:  He states that he has been having these symptoms for about one month now.  He says that in the beginning, when some of the first episodes started, that he would have really bad cramps to the point that he was on the floor and also had to miss work. Says this was the first time he missed work in 5 years.  Says now it is to the point that when he needs to go to the bathroom he has crampy pain, which he says is better than it was in the beginning, but that this is followed by diarrhea. Says that first thing in the morning he has this mild crampy pain followed by loose stool. Says that this (early morning loose stool every morning) has been happening just in the past 1-2 weeks. Also after he eats, within 30 minutes, he will develop crampy abdominal pain and diarrhea. Says it has gotten to where he has decreased his eating to where he may just eat a sandwich per day. Says that he has tried Pepto-Bismol but this has not helped. He has tried to watch what he is eating but he sees no pattern regarding his dietary intake. Also notes that sometimes he will here his abdomen rumbling and gurgling. Says that also at night sometimes he will wake up with these symptoms at night but then first thing in the morning he has been having symptoms as documented above.  I noted that when I reviewed his chart he just had a colonoscopy. He says that this was scheduled as a cancer screening at his last physical. Says that because he knew he had that coming up, he was hoping that might give some answers, so he has not actually gone in and seen any medical provider about these symptoms until today. However, because symptoms were worsening, he  went ahead and scheduled visit today.  He says that when the colonoscopy was completed he was kind of out of it but he does not remember the doctor telling him much about the findings. He says that his wife says that the doctor told her "something  about diverticulitis and red streaks". Says that they were told that they sent something off for testing and they would be sending them something in the mail regarding those results.  He states that he has not been on antibiotics recently. He states that he has had no foreign travel recently.     Past Medical History  Diagnosis Date  . Hyperlipidemia   . Anxiety   . Hypercholesteremia   . Degenerative disc disease, cervical     no limitations  . Arthritis     hands, neck  . Hypertension     neg stress test several yrs ago     Home Meds: Outpatient Prescriptions Prior to Visit  Medication Sig Dispense Refill  . ALPRAZolam (XANAX) 0.5 MG tablet TAKE ONE TABLET BY MOUTH AT BEDTIME A S NEEDED FOR SLEEP 30 tablet 0  . aspirin 81 MG tablet Take 81 mg by mouth daily.    . Glucosamine-Chondroitin (OSTEO BI-FLEX REGULAR STRENGTH PO) Take 2 tablets by mouth daily.    Marland Kitchen lisinopril (PRINIVIL,ZESTRIL) 10 MG tablet Take 1  tablet (10 mg total) by mouth daily. 30 tablet 5  . Multiple Vitamin (MULTIVITAMIN) tablet Take 1 tablet by mouth daily.    . Omega-3 Fatty Acids (FISH OIL) 1000 MG CAPS Take 2,000 mg by mouth daily.    Marland Kitchen PARoxetine (PAXIL) 20 MG tablet Take 1 tablet (20 mg total) by mouth daily. 90 tablet 3   No facility-administered medications prior to visit.    Allergies:  Allergies  Allergen Reactions  . Morphine And Related Nausea Only  . Statins Other (See Comments)    Effected liver enzymes    Social History   Social History  . Marital Status: Married    Spouse Name: N/A  . Number of Children: N/A  . Years of Education: N/A   Occupational History  . Not on file.   Social History Main Topics  . Smoking status: Never  Smoker   . Smokeless tobacco: Never Used  . Alcohol Use: 2.4 oz/week    3 Cans of beer, 1 Shots of liquor per week     Comment: occasional  . Drug Use: No  . Sexual Activity: Not on file     Comment: avid runner and cyclist.  Married.   Other Topics Concern  . Not on file   Social History Narrative    History reviewed. No pertinent family history.   Review of Systems:  See HPI for pertinent ROS. All other ROS negative.    Physical Exam: Blood pressure 144/96, pulse 68, temperature 98.5 F (36.9 C), temperature source Oral, resp. rate 18, weight 210 lb (95.255 kg)., Body mass index is 28.47 kg/(m^2). General: WNWD WM. Appears in no acute distress. Neck: Supple. No thyromegaly. No lymphadenopathy. Lungs: Clear bilaterally to auscultation without wheezes, rales, or rhonchi. Breathing is unlabored. Heart: RRR with S1 S2. No murmurs, rubs, or gallops. Abdomen: Soft, non-tender, non-distended with normoactive bowel sounds. No hepatomegaly. No rebound/guarding. No obvious abdominal masses. No area of tenderness with palpation.  Musculoskeletal:  Strength and tone normal for age. Extremities/Skin: Warm and dry. Neuro: Alert and oriented X 3. Moves all extremities spontaneously. Gait is normal. CNII-XII grossly in tact. Psych:  Responds to questions appropriately with a normal affect.     ASSESSMENT AND PLAN:  49 y.o. year old male with  1. Diarrhea, unspecified type I was able to review colonoscopy report in epic. Findings: A diffuse area of moderately erythematous mucosa was found in the entire colon. Random biopsies were obtained. The terminal ileum appeared normal. This was also biopsied for histology. Impression: Erythematous mucosa in the entire examined colon. Reviewed this with him. Will check labs and then will follow-up with him when I get these results. - CBC with Differential/Platelet - COMPLETE METABOLIC PANEL WITH GFR - TSH - Celiac panel 10 - Ova and parasite  examination - Stool culture; Future - Clostridium Difficile by PCR  2. Generalized abdominal pain I was able to review colonoscopy report in epic. Findings: A diffuse area of moderately erythematous mucosa was found in the entire colon. Random biopsies were obtained. The terminal ileum appeared normal. This was also biopsied for histology. Impression: Erythematous mucosa in the entire examined colon. Reviewed this with him. Will check labs and then will follow-up with him when I get these results.  - CBC with Differential/Platelet - COMPLETE METABOLIC PANEL WITH GFR - TSH - Celiac panel 10 - Ova and parasite examination - Stool culture; Future - Clostridium Difficile by PCR   Signed, Olean Ree [place], [place], [facility] 03/06/2015 4:03  PM

## 2015-03-07 ENCOUNTER — Encounter: Payer: Self-pay | Admitting: Family Medicine

## 2015-03-07 LAB — CBC WITH DIFFERENTIAL/PLATELET
BASOS PCT: 1 % (ref 0–1)
Basophils Absolute: 0.1 10*3/uL (ref 0.0–0.1)
EOS ABS: 0.2 10*3/uL (ref 0.0–0.7)
EOS PCT: 2 % (ref 0–5)
HEMATOCRIT: 43.2 % (ref 39.0–52.0)
Hemoglobin: 15.1 g/dL (ref 13.0–17.0)
LYMPHS PCT: 20 % (ref 12–46)
Lymphs Abs: 1.6 10*3/uL (ref 0.7–4.0)
MCH: 31.3 pg (ref 26.0–34.0)
MCHC: 35 g/dL (ref 30.0–36.0)
MCV: 89.6 fL (ref 78.0–100.0)
MONO ABS: 1 10*3/uL (ref 0.1–1.0)
MPV: 10 fL (ref 8.6–12.4)
Monocytes Relative: 13 % — ABNORMAL HIGH (ref 3–12)
Neutro Abs: 5.1 10*3/uL (ref 1.7–7.7)
Neutrophils Relative %: 64 % (ref 43–77)
PLATELETS: 271 10*3/uL (ref 150–400)
RBC: 4.82 MIL/uL (ref 4.22–5.81)
RDW: 13.6 % (ref 11.5–15.5)
WBC: 7.9 10*3/uL (ref 4.0–10.5)

## 2015-03-07 LAB — COMPLETE METABOLIC PANEL WITH GFR
ALT: 23 U/L (ref 9–46)
AST: 18 U/L (ref 10–35)
Albumin: 4.2 g/dL (ref 3.6–5.1)
Alkaline Phosphatase: 44 U/L (ref 40–115)
BUN: 13 mg/dL (ref 7–25)
CALCIUM: 9 mg/dL (ref 8.6–10.3)
CHLORIDE: 102 mmol/L (ref 98–110)
CO2: 27 mmol/L (ref 20–31)
Creat: 0.85 mg/dL (ref 0.70–1.33)
GFR, Est African American: >89 mL/min (ref >=60)
GFR, Est Non African American: >89 mL/min (ref >=60)
Glucose, Bld: 87 mg/dL (ref 70–99)
POTASSIUM: 3.9 mmol/L (ref 3.5–5.3)
Sodium: 138 mmol/L (ref 135–146)
Total Bilirubin: 0.7 mg/dL (ref 0.2–1.2)
Total Protein: 6.2 g/dL (ref 6.1–8.1)

## 2015-03-07 LAB — CELIAC PANEL 10
Endomysial Screen: NEGATIVE
GLIADIN IGG: 1 U (ref <20)
Gliadin IgA: 3 Units (ref <20)
IGA: 116 mg/dL (ref 68–379)
TISSUE TRANSGLUTAMINASE AB, IGA: 1 U/mL (ref <4)
Tissue Transglut Ab: 1 U/mL (ref <6)

## 2015-03-07 LAB — TSH: TSH: 1.815 u[IU]/mL (ref 0.350–4.500)

## 2015-03-07 MED ORDER — BUDESONIDE 3 MG PO CPEP: 9.0000 mg | ORAL_CAPSULE | ORAL | Status: DC

## 2015-03-07 NOTE — Telephone Encounter (Signed)
Pt has been scheduled for his follow up appt this coming Tuesday, Nov 6th. Pt has asked if something can be called in for his diarrhea. He is quite miserable. He said he would definitely keep his appt for Tuesday because he does have questions. Please advise.

## 2015-03-07 NOTE — Telephone Encounter (Signed)
Spoke to wife.  Dr Allen Norris office called them as well.  Pt has decided to go ahead and follow up with Dr Allen Norris.

## 2015-03-07 NOTE — Telephone Encounter (Signed)
Call patient back and let him know that the GI doctor who did the colonoscopy (Dr. Evalee Jefferson staff sent me a message yesterday after patient's visit with me. They had reviewed the pathology findings from the biopsies they did during the colonoscopy. In their message to me they stated that the patient was not planning to follow up with Dr. Allen Norris and that pt planned to follow-up here. Tell him the medicine that they were recommending he start is a medicine that usually specialists / GI prescribes and manages. This needs to be managed by GI doctor. If he does not want to go back to Dr. Allen Norris or another GI in [place] (the message from the GI office staff stated that he mentioned that he works in [place] and does not want to have to drive to [facility]), then we can refer him to a different GI doctor in [place]. He does need to f/u with a GI doctor--find out if he has preference regarding which one.

## 2015-03-07 NOTE — Telephone Encounter (Signed)
Budesinide 9mg  qam. And imodium as needed.

## 2015-03-07 NOTE — Telephone Encounter (Signed)
Rx sent to pt's pharmacy. Pt has been notified this was done.

## 2015-03-07 NOTE — Telephone Encounter (Signed)
Excellent

## 2015-03-07 NOTE — Telephone Encounter (Signed)
-----   Message from Lucilla Lame, MD sent at 03/06/2015  7:56 AM EST ----- Please have the patient come in for a follow up.

## 2015-03-11 ENCOUNTER — Ambulatory Visit: Payer: 59 | Admitting: Family Medicine

## 2015-03-12 ENCOUNTER — Encounter: Payer: Self-pay | Admitting: Gastroenterology

## 2015-03-12 ENCOUNTER — Ambulatory Visit (INDEPENDENT_AMBULATORY_CARE_PROVIDER_SITE_OTHER): Payer: 59 | Admitting: Gastroenterology

## 2015-03-12 DIAGNOSIS — K52839 Microscopic colitis, unspecified: Secondary | ICD-10-CM

## 2015-03-12 NOTE — Progress Notes (Signed)
Primary Care Physician: Odette Fraction, MD  Primary Gastroenterologist:  Dr. Lucilla Lame  Chief Complaint  Patient presents with  . follow up pathology results    HPI: Darryl Ross is a 49 y.o. male here for follow-up after having a colonoscopy. The patient had a colonoscopy with some inflammation seen in the colon and biopsies came back showing lymphocytic colitis. The patient is on lisinopril and aspirin which are possible occasions that have been linked to lymphocytic colitis. The patient states that the diarrhea started before he started taking the lisinopril. The patient is now on budesonide but he was started on last week and states that his symptoms are much better and he is feeling almost back to normal.  Current Outpatient Prescriptions  Medication Sig Dispense Refill  . aspirin 81 MG tablet Take 81 mg by mouth daily.    . budesonide (ENTOCORT EC) 3 MG 24 hr capsule Take 3 capsules (9 mg total) by mouth every morning. 90 capsule 1  . Glucosamine-Chondroitin (OSTEO BI-FLEX REGULAR STRENGTH PO) Take 2 tablets by mouth daily.    Marland Kitchen lisinopril (PRINIVIL,ZESTRIL) 10 MG tablet Take 1 tablet (10 mg total) by mouth daily. 30 tablet 5  . Multiple Vitamin (MULTIVITAMIN) tablet Take 1 tablet by mouth daily.    . Omega-3 Fatty Acids (FISH OIL) 1000 MG CAPS Take 2,000 mg by mouth daily.    Marland Kitchen PARoxetine (PAXIL) 20 MG tablet Take 1 tablet (20 mg total) by mouth daily. 90 tablet 3  . ALPRAZolam (XANAX) 0.5 MG tablet TAKE ONE TABLET BY MOUTH AT BEDTIME A S NEEDED FOR SLEEP (Patient not taking: Reported on 03/12/2015) 30 tablet 0   No current facility-administered medications for this visit.    Allergies as of 03/12/2015 - Review Complete 03/12/2015  Allergen Reaction Noted  . Morphine and related Nausea Only 10/28/2012  . Statins Other (See Comments) 10/28/2012    ROS:  General: Negative for anorexia, weight loss, fever, chills, fatigue, weakness. ENT: Negative for hoarseness,  difficulty swallowing , nasal congestion. CV: Negative for chest pain, angina, palpitations, dyspnea on exertion, peripheral edema.  Respiratory: Negative for dyspnea at rest, dyspnea on exertion, cough, sputum, wheezing.  GI: See history of present illness. GU:  Negative for dysuria, hematuria, urinary incontinence, urinary frequency, nocturnal urination.  Endo: Negative for unusual weight change.    Physical Examination:   There were no vitals taken for this visit.  General: Well-nourished, well-developed in no acute distress.  Eyes: No icterus. Conjunctivae pink. Neuro: Alert and oriented x 3.  Grossly intact. Skin: Warm and dry, no jaundice.   Psych: Alert and cooperative, normal mood and affect.  Labs:    Imaging Studies: No results found.  Assessment and Plan:   Darryl Ross is a 49 y.o. y/o male comes in for follow-up after having a colonoscopy with biopsies due to inflammation. The patient was found to have lymphocytic colitis. The patient is doing much better on budesonide. The patient will take this for a total of 8 weeks. After he finishes that he was see how his symptoms progress. If he continues to have diarrhea he'll try Imodium while contacting me for possible further treatment of his lymphocytic colitis. The patient has also been told that he may need to have his blood pressure medication changed if the diarrhea comes back shortly after stopping his budesonide.   Note: This dictation was prepared with Dragon dictation along with smaller phrase technology. Any transcriptional errors that result from this process  are unintentional.

## 2015-05-30 ENCOUNTER — Other Ambulatory Visit: Payer: Self-pay | Admitting: Family Medicine

## 2015-05-31 NOTE — Telephone Encounter (Signed)
Medication refilled per protocol. 

## 2015-07-04 ENCOUNTER — Other Ambulatory Visit: Payer: Self-pay | Admitting: Family Medicine

## 2015-07-04 NOTE — Telephone Encounter (Signed)
Refill appropriate and filled per protocol. 

## 2015-09-04 ENCOUNTER — Other Ambulatory Visit: Payer: Self-pay | Admitting: *Deleted

## 2015-09-04 MED ORDER — PAROXETINE HCL 20 MG PO TABS: 20.0000 mg | ORAL_TABLET | Freq: Every day | ORAL | Status: DC

## 2015-09-04 NOTE — Telephone Encounter (Signed)
Received fax requesting refill on Paxil.   Refill appropriate and filled per protocol.

## 2015-09-09 ENCOUNTER — Other Ambulatory Visit: Payer: Self-pay

## 2015-09-09 MED ORDER — PAROXETINE HCL 20 MG PO TABS: 20.0000 mg | ORAL_TABLET | Freq: Every day | ORAL | Status: DC

## 2015-09-09 MED ORDER — LISINOPRIL 10 MG PO TABS: 10.0000 mg | ORAL_TABLET | Freq: Every day | ORAL | Status: DC

## 2015-12-04 ENCOUNTER — Other Ambulatory Visit: Payer: Self-pay | Admitting: Family Medicine

## 2015-12-04 NOTE — Telephone Encounter (Signed)
Ok to refill??  Last office visit 03/06/2015.  Last refill 01/04/2015.

## 2015-12-05 NOTE — Telephone Encounter (Signed)
Medication called to pharmacy. 

## 2015-12-05 NOTE — Telephone Encounter (Signed)
ok 

## 2016-01-01 ENCOUNTER — Other Ambulatory Visit: Payer: Self-pay | Admitting: Family Medicine

## 2016-01-01 MED ORDER — LISINOPRIL 10 MG PO TABS: 10.0000 mg | ORAL_TABLET | Freq: Every day | ORAL | 0 refills | Status: DC

## 2016-01-01 NOTE — Telephone Encounter (Signed)
Medication called/sent to requested pharmacy and pt will need appt before refills

## 2016-02-07 ENCOUNTER — Other Ambulatory Visit: Payer: Self-pay | Admitting: Family Medicine

## 2016-02-07 ENCOUNTER — Other Ambulatory Visit: Payer: 59

## 2016-02-07 DIAGNOSIS — E785 Hyperlipidemia, unspecified: Secondary | ICD-10-CM

## 2016-02-07 DIAGNOSIS — I10 Essential (primary) hypertension: Secondary | ICD-10-CM

## 2016-02-07 DIAGNOSIS — Z79899 Other long term (current) drug therapy: Secondary | ICD-10-CM

## 2016-02-07 DIAGNOSIS — Z0000 Encounter for general adult medical examination without abnormal findings: Secondary | ICD-10-CM

## 2016-02-07 DIAGNOSIS — Z125 Encounter for screening for malignant neoplasm of prostate: Secondary | ICD-10-CM

## 2016-02-07 LAB — CBC WITH DIFFERENTIAL/PLATELET
Basophils Absolute: 0 cells/uL (ref 0–200)
Basophils Relative: 0 %
Eosinophils Absolute: 180 cells/uL (ref 15–500)
Eosinophils Relative: 3 %
HEMATOCRIT: 46 % (ref 38.5–50.0)
Hemoglobin: 15.3 g/dL (ref 13.0–17.0)
LYMPHS PCT: 24 %
Lymphs Abs: 1440 cells/uL (ref 850–3900)
MCH: 30.4 pg (ref 27.0–33.0)
MCHC: 33.3 g/dL (ref 32.0–36.0)
MCV: 91.5 fL (ref 80.0–100.0)
MONO ABS: 720 {cells}/uL (ref 200–950)
MPV: 10.2 fL (ref 7.5–12.5)
Monocytes Relative: 12 %
NEUTROS PCT: 61 %
Neutro Abs: 3660 cells/uL (ref 1500–7800)
Platelets: 263 10*3/uL (ref 140–400)
RBC: 5.03 MIL/uL (ref 4.20–5.80)
RDW: 13.4 % (ref 11.0–15.0)
WBC: 6 10*3/uL (ref 3.8–10.8)

## 2016-02-07 LAB — TSH: TSH: 2.51 m[IU]/L (ref 0.40–4.50)

## 2016-02-07 LAB — PSA: PSA: 0.3 ng/mL (ref <=4.0)

## 2016-02-08 LAB — COMPLETE METABOLIC PANEL WITH GFR
ALT: 36 U/L (ref 9–46)
AST: 23 U/L (ref 10–35)
Albumin: 4.1 g/dL (ref 3.6–5.1)
Alkaline Phosphatase: 42 U/L (ref 40–115)
BUN: 11 mg/dL (ref 7–25)
CALCIUM: 9.1 mg/dL (ref 8.6–10.3)
CHLORIDE: 103 mmol/L (ref 98–110)
CO2: 26 mmol/L (ref 20–31)
Creat: 1.13 mg/dL (ref 0.70–1.33)
GFR, Est African American: 86 mL/min (ref >=60)
GFR, Est Non African American: 74 mL/min (ref >=60)
GLUCOSE: 90 mg/dL (ref 70–99)
POTASSIUM: 4.6 mmol/L (ref 3.5–5.3)
SODIUM: 139 mmol/L (ref 135–146)
Total Bilirubin: 0.6 mg/dL (ref 0.2–1.2)
Total Protein: 6.2 g/dL (ref 6.1–8.1)

## 2016-02-08 LAB — LIPID PANEL
CHOL/HDL RATIO: 6.3 ratio — AB (ref <=5.0)
Cholesterol: 257 mg/dL — ABNORMAL HIGH (ref 125–200)
HDL: 41 mg/dL (ref >=40)
LDL Cholesterol: 149 mg/dL — ABNORMAL HIGH (ref <130)
Triglycerides: 336 mg/dL — ABNORMAL HIGH (ref <150)
VLDL: 67 mg/dL — AB (ref <30)

## 2016-02-11 ENCOUNTER — Encounter: Payer: Self-pay | Admitting: Family Medicine

## 2016-02-11 ENCOUNTER — Ambulatory Visit (INDEPENDENT_AMBULATORY_CARE_PROVIDER_SITE_OTHER): Payer: 59 | Admitting: Family Medicine

## 2016-02-11 VITALS — BP 112/80 | HR 64 | Temp 98.3°F | Resp 14 | Ht 72.0 in | Wt 219.0 lb

## 2016-02-11 DIAGNOSIS — Z0000 Encounter for general adult medical examination without abnormal findings: Secondary | ICD-10-CM | POA: Diagnosis not present

## 2016-02-11 DIAGNOSIS — I10 Essential (primary) hypertension: Secondary | ICD-10-CM

## 2016-02-11 MED ORDER — FENOFIBRATE 160 MG PO TABS: 160.0000 mg | ORAL_TABLET | Freq: Every day | ORAL | 5 refills | Status: DC

## 2016-02-11 NOTE — Progress Notes (Signed)
Subjective:    Patient ID: Darryl Ross, male    DOB: [birth year], 50 y.o.   MRN: [id number]  HPI Patient is here for complete physical exam. He as no concerns. Patient had a colonoscopy last year that showed no evidence of colon cancer. Recently had a PSA that was 0.37 prostate cancer screening is up-to-date. Flu shot and tetanus shot are up-to-date. Most recent lab work is listed below: Appointment on 02/07/2016  Component Date Value Ref Range Status  . Sodium 02/08/2016 139  135 - 146 mmol/L Final  . Potassium 02/08/2016 4.6  3.5 - 5.3 mmol/L Final  . Chloride 02/08/2016 103  98 - 110 mmol/L Final  . CO2 02/08/2016 26  20 - 31 mmol/L Final  . Glucose, Bld 02/08/2016 90  70 - 99 mg/dL Final  . BUN 02/08/2016 11  7 - 25 mg/dL Final  . Creat 02/08/2016 1.13  0.70 - 1.33 mg/dL Final   Comment:   For patients > or = 52 years of age: The upper reference limit for Creatinine is approximately 13% higher for people identified as African-American.     . Total Bilirubin 02/08/2016 0.6  0.2 - 1.2 mg/dL Final  . Alkaline Phosphatase 02/08/2016 42  40 - 115 U/L Final  . AST 02/08/2016 23  10 - 35 U/L Final  . ALT 02/08/2016 36  9 - 46 U/L Final  . Total Protein 02/08/2016 6.2  6.1 - 8.1 g/dL Final  . Albumin 02/08/2016 4.1  3.6 - 5.1 g/dL Final  . Calcium 02/08/2016 9.1  8.6 - 10.3 mg/dL Final  . GFR, Est African American 02/08/2016 86  >=60 mL/min Final  . GFR, Est Non African American 02/08/2016 74  >=60 mL/min Final  . TSH 02/07/2016 2.51  0.40 - 4.50 mIU/L Final  . Cholesterol 02/08/2016 257* 125 - 200 mg/dL Final  . Triglycerides 02/08/2016 336* <150 mg/dL Final  . HDL 02/08/2016 41  >=40 mg/dL Final  . Total CHOL/HDL Ratio 02/08/2016 6.3* <=5.0 Ratio Final  . VLDL 02/08/2016 67* <30 mg/dL Final  . LDL Cholesterol 02/08/2016 149* <130 mg/dL Final   Comment:   Total Cholesterol/HDL Ratio:CHD Risk                        Coronary Heart Disease Risk Table             Men       Women          1/2 Average Risk              3.4        3.3              Average Risk              5.0        4.4           2X Average Risk              9.6        7.1           3X Average Risk             23.4       11.0 Use the calculated Patient Ratio above and the CHD Risk table  to determine the patient's CHD Risk.   . WBC 02/07/2016 6.0  3.8 - 10.8 K/uL Final  . RBC 02/07/2016 5.03  4.20 - 5.80 MIL/uL  Final  . Hemoglobin 02/07/2016 15.3  13.0 - 17.0 g/dL Final  . HCT 02/07/2016 46.0  38.5 - 50.0 % Final  . MCV 02/07/2016 91.5  80.0 - 100.0 fL Final  . MCH 02/07/2016 30.4  27.0 - 33.0 pg Final  . MCHC 02/07/2016 33.3  32.0 - 36.0 g/dL Final  . RDW 02/07/2016 13.4  11.0 - 15.0 % Final  . Platelets 02/07/2016 263  140 - 400 K/uL Final  . MPV 02/07/2016 10.2  7.5 - 12.5 fL Final  . Neutro Abs 02/07/2016 3660  1,500 - 7,800 cells/uL Final  . Lymphs Abs 02/07/2016 1440  850 - 3,900 cells/uL Final  . Monocytes Absolute 02/07/2016 720  200 - 950 cells/uL Final  . Eosinophils Absolute 02/07/2016 180  15 - 500 cells/uL Final  . Basophils Absolute 02/07/2016 0  0 - 200 cells/uL Final  . Neutrophils Relative % 02/07/2016 61  % Final  . Lymphocytes Relative 02/07/2016 24  % Final  . Monocytes Relative 02/07/2016 12  % Final  . Eosinophils Relative 02/07/2016 3  % Final  . Basophils Relative 02/07/2016 0  % Final  . Smear Review 02/07/2016 Criteria for review not met   Final  . PSA 02/07/2016 0.3  <=4.0 ng/mL Final   Comment:   The total PSA value from this assay system is standardized against the WHO standard. The test result will be approximately 20% lower when compared to the equimolar-standardized total PSA (Beckman Coulter). Comparison of serial PSA results should be interpreted with this fact in mind.   This test was performed using the Siemens chemiluminescent method. Values obtained from different assay methods cannot be used interchangeably. PSA levels,  regardless of value, should not be interpreted as absolute evidence of the presence or absence of disease.   Effective November 11, 2015, Total PSA is being tested on the Siemens Centaur XP using chemiluminescence methodology. Re-baseline testing will be available until February 10, 2016 at no charge. If you have a patient that may require re-baselining, please order [id number] in addition to [id number].     Past Medical History:  Diagnosis Date  . Anxiety   . Arthritis    hands, neck  . Degenerative disc disease, cervical    no limitations  . Hypercholesteremia   . Hyperlipidemia   . Hypertension    neg stress test several yrs ago  . Lymphocytic colitis    Past Surgical History:  Procedure Laterality Date  . COLONOSCOPY WITH PROPOFOL N/A 03/04/2015   Procedure: COLONOSCOPY WITH PROPOFOL;  Surgeon: Lucilla Lame, MD;  Location: [facility];  Service: Endoscopy;  Laterality: N/A;  . HAND SURGERY     fracture repair   Current Outpatient Prescriptions on File Prior to Visit  Medication Sig Dispense Refill  . ALPRAZolam (XANAX) 0.5 MG tablet TAKE 1 TABLET BY MOUTH AT BEDTIME AS NEEDED FOR ANXIETY 30 tablet 0  . aspirin 81 MG tablet Take 81 mg by mouth daily.    . budesonide (ENTOCORT EC) 3 MG 24 hr capsule TAKE 3 CAPSULES (9 MG TOTAL) BY MOUTH EVERY MORNING. (Patient taking differently: TAKE 3 CAPSULES (9 MG TOTAL) BY MOUTH EVERY MORNING PRN. (for Colitits)) 90 capsule 2  . Glucosamine-Chondroitin (OSTEO BI-FLEX REGULAR STRENGTH PO) Take 2 tablets by mouth daily.    Marland Kitchen lisinopril (PRINIVIL,ZESTRIL) 10 MG tablet Take 1 tablet (10 mg total) by mouth daily. 90 tablet 0  . Multiple Vitamin (MULTIVITAMIN) tablet Take 1 tablet by mouth daily.    Marland Kitchen  Omega-3 Fatty Acids (FISH OIL) 1000 MG CAPS Take 2,000 mg by mouth daily.    Marland Kitchen PARoxetine (PAXIL) 20 MG tablet Take 1 tablet (20 mg total) by mouth daily. 90 tablet 3   No current facility-administered medications on file prior to visit.     Allergies  Allergen Reactions  . Morphine And Related Nausea Only  . Statins Other (See Comments)    Effected liver enzymes   Social History   Social History  . Marital status: Married    Spouse name: N/A  . Number of children: N/A  . Years of education: N/A   Occupational History  . Not on file.   Social History Main Topics  . Smoking status: Never Smoker  . Smokeless tobacco: Never Used  . Alcohol use 2.4 oz/week    3 Cans of beer, 1 Shots of liquor per week     Comment: occasional  . Drug use: No  . Sexual activity: Not on file     Comment: avid runner and cyclist.  Married.   Other Topics Concern  . Not on file   Social History Narrative  . No narrative on file   No family history on file.    Review of Systems  All other systems reviewed and are negative.      Objective:   Physical Exam  Constitutional: He is oriented to person, place, and time. He appears well-developed and well-nourished. No distress.  HENT:  Head: Normocephalic and atraumatic.  Right Ear: External ear normal.  Left Ear: External ear normal.  Nose: Nose normal.  Mouth/Throat: Oropharynx is clear and moist. No oropharyngeal exudate.  Eyes: Conjunctivae and EOM are normal. Pupils are equal, round, and reactive to light. Right eye exhibits no discharge. Left eye exhibits no discharge. No scleral icterus.  Neck: Normal range of motion. Neck supple. No JVD present. No tracheal deviation present. No thyromegaly present.  Cardiovascular: Normal rate, regular rhythm, normal heart sounds and intact distal pulses.  Exam reveals no gallop and no friction rub.   No murmur heard. Pulmonary/Chest: Effort normal and breath sounds normal. No stridor. No respiratory distress. He has no wheezes. He has no rales. He exhibits no tenderness.  Abdominal: Soft. Bowel sounds are normal. He exhibits no distension and no mass. There is no tenderness. There is no rebound and no guarding.  Genitourinary: Penis  normal. Rectal exam shows external hemorrhoid.  Musculoskeletal: Normal range of motion. He exhibits no edema or tenderness.  Lymphadenopathy:    He has no cervical adenopathy.  Neurological: He is alert and oriented to person, place, and time. He has normal reflexes. No cranial nerve deficit. He exhibits normal muscle tone. Coordination normal.  Skin: Skin is warm. No rash noted. He is not diaphoretic. No erythema. No pallor.  Psychiatric: He has a normal mood and affect. His behavior is normal. Judgment and thought content normal.  Vitals reviewed.         Assessment & Plan:  Routine general medical examination at a health care facility  Benign essential HTN  Patient's physical exam is completely normal.Most recent lab work as listed below: Appointment on 02/07/2016  Component Date Value Ref Range Status  . Sodium 02/08/2016 139  135 - 146 mmol/L Final  . Potassium 02/08/2016 4.6  3.5 - 5.3 mmol/L Final  . Chloride 02/08/2016 103  98 - 110 mmol/L Final  . CO2 02/08/2016 26  20 - 31 mmol/L Final  . Glucose, Bld 02/08/2016 90  70 -  99 mg/dL Final  . BUN 02/08/2016 11  7 - 25 mg/dL Final  . Creat 02/08/2016 1.13  0.70 - 1.33 mg/dL Final   Comment:   For patients > or = 52 years of age: The upper reference limit for Creatinine is approximately 13% higher for people identified as African-American.     . Total Bilirubin 02/08/2016 0.6  0.2 - 1.2 mg/dL Final  . Alkaline Phosphatase 02/08/2016 42  40 - 115 U/L Final  . AST 02/08/2016 23  10 - 35 U/L Final  . ALT 02/08/2016 36  9 - 46 U/L Final  . Total Protein 02/08/2016 6.2  6.1 - 8.1 g/dL Final  . Albumin 02/08/2016 4.1  3.6 - 5.1 g/dL Final  . Calcium 02/08/2016 9.1  8.6 - 10.3 mg/dL Final  . GFR, Est African American 02/08/2016 86  >=60 mL/min Final  . GFR, Est Non African American 02/08/2016 74  >=60 mL/min Final  . TSH 02/07/2016 2.51  0.40 - 4.50 mIU/L Final  . Cholesterol 02/08/2016 257* 125 - 200 mg/dL Final  .  Triglycerides 02/08/2016 336* <150 mg/dL Final  . HDL 02/08/2016 41  >=40 mg/dL Final  . Total CHOL/HDL Ratio 02/08/2016 6.3* <=5.0 Ratio Final  . VLDL 02/08/2016 67* <30 mg/dL Final  . LDL Cholesterol 02/08/2016 149* <130 mg/dL Final   Comment:   Total Cholesterol/HDL Ratio:CHD Risk                        Coronary Heart Disease Risk Table                                        Men       Women          1/2 Average Risk              3.4        3.3              Average Risk              5.0        4.4           2X Average Risk              9.6        7.1           3X Average Risk             23.4       11.0 Use the calculated Patient Ratio above and the CHD Risk table  to determine the patient's CHD Risk.   . WBC 02/07/2016 6.0  3.8 - 10.8 K/uL Final  . RBC 02/07/2016 5.03  4.20 - 5.80 MIL/uL Final  . Hemoglobin 02/07/2016 15.3  13.0 - 17.0 g/dL Final  . HCT 02/07/2016 46.0  38.5 - 50.0 % Final  . MCV 02/07/2016 91.5  80.0 - 100.0 fL Final  . MCH 02/07/2016 30.4  27.0 - 33.0 pg Final  . MCHC 02/07/2016 33.3  32.0 - 36.0 g/dL Final  . RDW 02/07/2016 13.4  11.0 - 15.0 % Final  . Platelets 02/07/2016 263  140 - 400 K/uL Final  . MPV 02/07/2016 10.2  7.5 - 12.5 fL Final  . Neutro Abs 02/07/2016 3660  1,500 - 7,800 cells/uL Final  . Lymphs Abs 02/07/2016 1440  850 -  3,900 cells/uL Final  . Monocytes Absolute 02/07/2016 720  200 - 950 cells/uL Final  . Eosinophils Absolute 02/07/2016 180  15 - 500 cells/uL Final  . Basophils Absolute 02/07/2016 0  0 - 200 cells/uL Final  . Neutrophils Relative % 02/07/2016 61  % Final  . Lymphocytes Relative 02/07/2016 24  % Final  . Monocytes Relative 02/07/2016 12  % Final  . Eosinophils Relative 02/07/2016 3  % Final  . Basophils Relative 02/07/2016 0  % Final  . Smear Review 02/07/2016 Criteria for review not met   Final  . PSA 02/07/2016 0.3  <=4.0 ng/mL Final   Comment:   The total PSA value from this assay system is standardized against the WHO  standard. The test result will be approximately 20% lower when compared to the equimolar-standardized total PSA (Beckman Coulter). Comparison of serial PSA results should be interpreted with this fact in mind.   This test was performed using the Siemens chemiluminescent method. Values obtained from different assay methods cannot be used interchangeably. PSA levels, regardless of value, should not be interpreted as absolute evidence of the presence or absence of disease.   Effective November 11, 2015, Total PSA is being tested on the Siemens Centaur XP using chemiluminescence methodology. Re-baseline testing will be available until February 10, 2016 at no charge. If you have a patient that may require re-baselining, please order [id number] in addition to [id number].    Labs are significant for non-HDL cholesterol of 216 with a goal less than 160, a borderline low HDL cholesterol and significantly elevated triglycerides over 300. Patient states that his diet is not that bad. He also exercises regularly. He has been unable to tolerate statins in the past due to elevated liver function test. He can also not tolerate Zetia. Therefore we will try fenofibrate 160 mg daily and recheck fasting lipid panel and CMP in 3 months. The remainder of his preventative care is up-to-date

## 2016-04-03 ENCOUNTER — Other Ambulatory Visit: Payer: Self-pay | Admitting: Family Medicine

## 2016-05-14 ENCOUNTER — Encounter: Payer: Self-pay | Admitting: Family Medicine

## 2016-05-14 ENCOUNTER — Ambulatory Visit (INDEPENDENT_AMBULATORY_CARE_PROVIDER_SITE_OTHER): Payer: 59 | Admitting: Family Medicine

## 2016-05-14 VITALS — BP 136/90 | HR 72 | Temp 98.1°F | Resp 16 | Ht 72.0 in | Wt 222.0 lb

## 2016-05-14 DIAGNOSIS — R079 Chest pain, unspecified: Secondary | ICD-10-CM | POA: Diagnosis not present

## 2016-05-14 DIAGNOSIS — R55 Syncope and collapse: Secondary | ICD-10-CM

## 2016-05-14 DIAGNOSIS — R0789 Other chest pain: Secondary | ICD-10-CM | POA: Diagnosis not present

## 2016-05-14 NOTE — Progress Notes (Signed)
Subjective:    Patient ID: Darryl Ross, male    DOB: 09/30/1965, 51 y.o.   MRN: [id number]  HPI Patient is here today at the behest of his family. He had an episode of near-syncope recently. He was at his parents house with his wife. Suddenly he became extremely lightheaded and felt like he was going to pass out. This was not brought on by any exercise or activity. His heart began to feel like it was racing. He became clammy and nauseated. He had to sit down and the symptoms gradually past and a few minutes. Recently he's been having atypical left-sided chest pressure not brought on by exercise. He also reports recent dyspnea. He states that he gets more easily winded than in the past. EKG today shows normal sinus rhythm with normal intervals and normal axis. There is no evidence of ischemia or infarction  Past Medical History:  Diagnosis Date  . Anxiety   . Arthritis    hands, neck  . Degenerative disc disease, cervical    no limitations  . Hypercholesteremia   . Hyperlipidemia   . Hypertension    neg stress test several yrs ago  . Lymphocytic colitis    Past Surgical History:  Procedure Laterality Date  . COLONOSCOPY WITH PROPOFOL N/A 03/04/2015   Procedure: COLONOSCOPY WITH PROPOFOL;  Surgeon: Lucilla Lame, MD;  Location: [facility];  Service: Endoscopy;  Laterality: N/A;  . HAND SURGERY     fracture repair   Current Outpatient Prescriptions on File Prior to Visit  Medication Sig Dispense Refill  . ALPRAZolam (XANAX) 0.5 MG tablet TAKE 1 TABLET BY MOUTH AT BEDTIME AS NEEDED FOR ANXIETY 30 tablet 0  . aspirin 81 MG tablet Take 81 mg by mouth daily.    . budesonide (ENTOCORT EC) 3 MG 24 hr capsule TAKE 3 CAPSULES (9 MG TOTAL) BY MOUTH EVERY MORNING. (Patient taking differently: TAKE 3 CAPSULES (9 MG TOTAL) BY MOUTH EVERY MORNING PRN. (for Colitits)) 90 capsule 2  . fenofibrate 160 MG tablet Take 1 tablet (160 mg total) by mouth daily. 30 tablet 5  .  Glucosamine-Chondroitin (OSTEO BI-FLEX REGULAR STRENGTH PO) Take 2 tablets by mouth daily.    Marland Kitchen lisinopril (PRINIVIL,ZESTRIL) 10 MG tablet TAKE 1 TABLET (10 MG TOTAL) BY MOUTH DAILY. 90 tablet 3  . Multiple Vitamin (MULTIVITAMIN) tablet Take 1 tablet by mouth daily.    . Omega-3 Fatty Acids (FISH OIL) 1000 MG CAPS Take 2,000 mg by mouth daily.    Marland Kitchen PARoxetine (PAXIL) 20 MG tablet Take 1 tablet (20 mg total) by mouth daily. 90 tablet 3   No current facility-administered medications on file prior to visit.    Allergies  Allergen Reactions  . Morphine And Related Nausea Only  . Statins Other (See Comments)    Effected liver enzymes   Social History   Social History  . Marital status: Married    Spouse name: N/A  . Number of children: N/A  . Years of education: N/A   Occupational History  . Not on file.   Social History Main Topics  . Smoking status: Never Smoker  . Smokeless tobacco: Never Used  . Alcohol use 2.4 oz/week    3 Cans of beer, 1 Shots of liquor per week     Comment: occasional  . Drug use: No  . Sexual activity: Not on file     Comment: avid runner and cyclist.  Married.   Other Topics Concern  . Not  on file   Social History Narrative  . No narrative on file   No family history on file.    Review of Systems  All other systems reviewed and are negative.      Objective:   Physical Exam  Constitutional: He is oriented to person, place, and time. He appears well-developed and well-nourished. No distress.  HENT:  Head: Normocephalic and atraumatic.  Right Ear: External ear normal.  Left Ear: External ear normal.  Nose: Nose normal.  Mouth/Throat: Oropharynx is clear and moist. No oropharyngeal exudate.  Eyes: Conjunctivae and EOM are normal. Pupils are equal, round, and reactive to light. Right eye exhibits no discharge. Left eye exhibits no discharge. No scleral icterus.  Neck: Normal range of motion. Neck supple. No JVD present. No tracheal deviation  present. No thyromegaly present.  Cardiovascular: Normal rate, regular rhythm, normal heart sounds and intact distal pulses.  Exam reveals no gallop and no friction rub.   No murmur heard. Pulmonary/Chest: Effort normal and breath sounds normal. No stridor. No respiratory distress. He has no wheezes. He has no rales. He exhibits no tenderness.  Abdominal: Soft. Bowel sounds are normal. He exhibits no distension and no mass. There is no tenderness. There is no rebound and no guarding.  Musculoskeletal: Normal range of motion. He exhibits no edema or tenderness.  Lymphadenopathy:    He has no cervical adenopathy.  Neurological: He is alert and oriented to person, place, and time. He has normal reflexes. No cranial nerve deficit. He exhibits normal muscle tone. Coordination normal.  Skin: Skin is warm. No rash noted. He is not diaphoretic. No erythema. No pallor.  Psychiatric: He has a normal mood and affect. His behavior is normal. Judgment and thought content normal.  Vitals reviewed.         Assessment & Plan:  Chest pressure - Plan: EKG 12-Lead  Near syncope - Plan: Ambulatory referral to Cardiology Patient's EKG today is reassuring. He is certain that he was not experiencing a panic attack. It is possible that this was vasovagal. However I would like the patient wear a Holter monitor to rule out cardiac arrhythmias. I will consult cardiology given his atypical chest pain and near syncopal episode for a stress test and possibly an echocardiogram. Obtain a CBC to evaluate for anemia. Obtain a CMP to rule out left right disturbances

## 2016-05-15 ENCOUNTER — Ambulatory Visit: Payer: 59

## 2016-05-15 DIAGNOSIS — R0789 Other chest pain: Secondary | ICD-10-CM

## 2016-05-18 ENCOUNTER — Telehealth: Payer: Self-pay | Admitting: Family Medicine

## 2016-05-18 NOTE — Telephone Encounter (Signed)
Patient is calling to say that he was having hbp and pulse was high, wore heart moniter last week but nothing happened when he was wearing it, would like to know if there is anything he should do before he has his appt with his cardiologist  [phone]

## 2016-05-18 NOTE — Telephone Encounter (Signed)
I would start the patient on Toprol-XL 25 mg by mouth daily for palpitations and tachycardia. This could also help address his blood pressure. Otherwise I would follow up as planned with cardiology. I have not yet received the results of his Holter monitor but I will call him soon as I see the results

## 2016-05-19 MED ORDER — METOPROLOL SUCCINATE ER 25 MG PO TB24: 25.0000 mg | ORAL_TABLET | Freq: Every day | ORAL | 3 refills | Status: DC

## 2016-05-19 NOTE — Telephone Encounter (Signed)
Patient aware of providers recommendations and med sent to pahrm 

## 2016-05-21 ENCOUNTER — Encounter: Payer: Self-pay | Admitting: Cardiology

## 2016-05-21 ENCOUNTER — Ambulatory Visit (INDEPENDENT_AMBULATORY_CARE_PROVIDER_SITE_OTHER): Payer: 59 | Admitting: Cardiology

## 2016-05-21 ENCOUNTER — Encounter: Payer: Self-pay | Admitting: Family Medicine

## 2016-05-21 VITALS — BP 130/96 | HR 71 | Ht 72.0 in | Wt 219.5 lb

## 2016-05-21 DIAGNOSIS — R42 Dizziness and giddiness: Secondary | ICD-10-CM

## 2016-05-21 DIAGNOSIS — R0602 Shortness of breath: Secondary | ICD-10-CM

## 2016-05-21 DIAGNOSIS — R079 Chest pain, unspecified: Secondary | ICD-10-CM | POA: Diagnosis not present

## 2016-05-21 DIAGNOSIS — E782 Mixed hyperlipidemia: Secondary | ICD-10-CM

## 2016-05-21 DIAGNOSIS — I10 Essential (primary) hypertension: Secondary | ICD-10-CM | POA: Diagnosis not present

## 2016-05-21 DIAGNOSIS — R55 Syncope and collapse: Secondary | ICD-10-CM

## 2016-05-21 NOTE — Progress Notes (Signed)
Cardiology Office Note   Date:  05/21/2016   ID:  Darryl Ross, DOB 12/14/1965, MRN [id number]  Referring Doctor:  Odette Fraction, MD   Cardiologist:   Wende Bushy, MD   Reason for consultation:  Chief Complaint  Patient presents with  . other    Ref by Dr. Dennard Schaumann for chest pressure       History of Present Illness: Darryl Ross is a 51 y.o. male who presents forChest pressure. Symptoms started 2 weeks ago. Moderate intensity, sometimes radiating to the back. Sometimes associated with shortness of breath. He also has separately, shortness of breath with exertion that started recently as well.  Reports palpitations or noticing his heart rate go up. For this, PCP is ordered a Holter monitor. Results are not available.  Had one episode of feeling lightheaded and feeling that he was going to pass out again sometime in the last 2 weeks. no chest pain at that time. This may have been related to high blood pressure as his blood pressure was 140s over 100s at that time. No recurrence since then  Patient has a history of hyperlipidemia. He has tried several statins including rosuvastatin or crest her, atorvastatin or Lipitor, pravastatin or pravachol. He has not tolerated any of these statins due to abnormal LFTs in the > 100- 300s.   Patient denies PND, orthopnea, edema. No true syncope.   ROS:  Please see the history of present illness. Aside from mentioned under HPI, all other systems are reviewed and negative.     Past Medical History:  Diagnosis Date  . Anxiety   . Arthritis    hands, neck  . Degenerative disc disease, cervical    no limitations  . Hypercholesteremia   . Hyperlipidemia   . Hypertension    neg stress test several yrs ago  . Lymphocytic colitis     Past Surgical History:  Procedure Laterality Date  . COLONOSCOPY WITH PROPOFOL N/A 03/04/2015   Procedure: COLONOSCOPY WITH PROPOFOL;  Surgeon: Lucilla Lame, MD;  Location: [facility];   Service: Endoscopy;  Laterality: N/A;  . HAND SURGERY     fracture repair     reports that he has never smoked. He has never used smokeless tobacco. He reports that he drinks about 2.4 oz of alcohol per week . He reports that he does not use drugs.   family history includes Arrhythmia in his father; Hyperlipidemia in his mother; Hypertension in his father.   Outpatient Medications Prior to Visit  Medication Sig Dispense Refill  . ALPRAZolam (XANAX) 0.5 MG tablet TAKE 1 TABLET BY MOUTH AT BEDTIME AS NEEDED FOR ANXIETY 30 tablet 0  . aspirin 81 MG tablet Take 81 mg by mouth daily.    . budesonide (ENTOCORT EC) 3 MG 24 hr capsule TAKE 3 CAPSULES (9 MG TOTAL) BY MOUTH EVERY MORNING. (Patient taking differently: TAKE 3 CAPSULES (9 MG TOTAL) BY MOUTH EVERY MORNING PRN. (for Colitits)) 90 capsule 2  . Glucosamine-Chondroitin (OSTEO BI-FLEX REGULAR STRENGTH PO) Take 2 tablets by mouth daily.    Marland Kitchen lisinopril (PRINIVIL,ZESTRIL) 10 MG tablet TAKE 1 TABLET (10 MG TOTAL) BY MOUTH DAILY. 90 tablet 3  . Multiple Vitamin (MULTIVITAMIN) tablet Take 1 tablet by mouth daily.    . Omega-3 Fatty Acids (FISH OIL) 1000 MG CAPS Take 2,000 mg by mouth daily.    Marland Kitchen PARoxetine (PAXIL) 20 MG tablet Take 1 tablet (20 mg total) by mouth daily. 90 tablet 3  . metoprolol  succinate (TOPROL-XL) 25 MG 24 hr tablet Take 1 tablet (25 mg total) by mouth daily. (Patient not taking: Reported on 05/21/2016) 90 tablet 3  . fenofibrate 160 MG tablet Take 1 tablet (160 mg total) by mouth daily. (Patient not taking: Reported on 05/21/2016) 30 tablet 5   No facility-administered medications prior to visit.      Allergies: Morphine and related and Statins    PHYSICAL EXAM: VS:  BP (!) 130/96 (BP Location: Left Arm, Patient Position: Sitting, Cuff Size: Normal)   Pulse 71   Ht 6' (1.829 m)   Wt 219 lb 8 oz (99.6 kg)   BMI 29.77 kg/m  , Body mass index is 29.77 kg/m. Wt Readings from Last 3 Encounters:  05/21/16 219 lb 8 oz (99.6  kg)  05/14/16 222 lb (100.7 kg)  02/11/16 219 lb (99.3 kg)    GENERAL:  well developed, well nourished, Overweight, not in acute distress HEENT: normocephalic, pink conjunctivae, anicteric sclerae, no xanthelasma, normal dentition, oropharynx clear NECK:  no neck vein engorgement, JVP normal, no hepatojugular reflux, carotid upstroke brisk and symmetric, no bruit, no thyromegaly, no lymphadenopathy LUNGS:  good respiratory effort, clear to auscultation bilaterally CV:  PMI not displaced, no thrills, no lifts, S1 and S2 within normal limits, no palpable S3 or S4, no murmurs, no rubs, no gallops ABD:  Soft, nontender, nondistended, normoactive bowel sounds, no abdominal aortic bruit, no hepatomegaly, no splenomegaly MS: nontender back, no kyphosis, no scoliosis, no joint deformities EXT:  2+ DP/PT pulses, no edema, no varicosities, no cyanosis, no clubbing SKIN: warm, nondiaphoretic, normal turgor, no ulcers NEUROPSYCH: alert, oriented to person, place, and time, sensory/motor grossly intact, normal mood, appropriate affect  Recent Labs: 02/07/2016: ALT 36; BUN 11; Creat 1.13; Hemoglobin 15.3; Platelets 263; Potassium 4.6; Sodium 139; TSH 2.51   Lipid Panel    Component Value Date/Time   CHOL 257 (H) 02/07/2016 0830   TRIG 336 (H) 02/07/2016 0830   HDL 41 02/07/2016 0830   CHOLHDL 6.3 (H) 02/07/2016 0830   VLDL 67 (H) 02/07/2016 0830   LDLCALC 149 (H) 02/07/2016 0830     Other studies Reviewed:  EKG:  The ekg from 05/21/2016 was personally reviewed by me and it revealed sinus rhythm, 71 BPM  Additional studies/ records that were reviewed personally reviewed by me today include: None available   ASSESSMENT AND PLAN: Chest pain Presyncope Uncontrolled hypertension recently Shortness of breath with exertion He has significant symptoms, together with risk factors for CAD including age, gender, hypertension, hyperlipidemia that is untreated. Recommend further evaluation with  echocardiogram and exercise nuclear stress test.  Hypertension PCP has been working on blood pressure control with the addition of metoprolol agree with this.  Hyperlipidemia Patient unable to use statins due to significant elevation in AST and ALT. We will work him up for possible use of Repatha.   Current medicines are reviewed at length with the patient today.  The patient does not have concerns regarding medicines.  Labs/ tests ordered today include:  Orders Placed This Encounter  Procedures  . NM Myocar Multi W/Spect W/Wall Motion / EF  . EKG 12-Lead  . ECHOCARDIOGRAM COMPLETE    I had a lengthy and detailed discussion with the patient regarding diagnoses, prognosis, diagnostic options, treatment options , and side effects of medications.   I counseled the patient on importance of lifestyle modification including heart healthy diet, regular physical activity Once cardiac workup is complete.   Disposition:   FU with undersigned  after tests   Thank you for this consultation. We will forwarding this consultation to referring physician.   Signed, Wende Bushy, MD  05/21/2016 12:25 PM    [place]  This note was generated in part with voice recognition software and I apologize for any typographical errors that were not detected and corrected.

## 2016-05-21 NOTE — Patient Instructions (Addendum)
Testing/Procedures: Your physician has requested that you have an echocardiogram. Echocardiography is a painless test that uses sound waves to create images of your heart. It provides your doctor with information about the size and shape of your heart and how well your heart's chambers and valves are working. This procedure takes approximately one hour. There are no restrictions for this procedure.  [place]  Your caregiver has ordered a Stress Test with nuclear imaging. The purpose of this test is to evaluate the blood supply to your heart muscle. This procedure is referred to as a "Non-Invasive Stress Test." This is because other than having an IV started in your vein, nothing is inserted or "invades" your body. Cardiac stress tests are done to find areas of poor blood flow to the heart by determining the extent of coronary artery disease (CAD).   Please note: these test may take anywhere between 2-4 hours to complete  PLEASE REPORT TO [facility] AT THE FIRST DESK WILL DIRECT YOU WHERE TO GO  Date of Procedure:_Tuesday May 26, 2016 at 07:30AM__  Arrival Time for Procedure:_Arrive at 07:15AM to register__  Instructions regarding medication:   _X___:  Hold metoprolol the night before procedure and morning of procedure    PLEASE NOTIFY THE OFFICE AT LEAST 24 HOURS IN ADVANCE IF YOU ARE UNABLE TO KEEP YOUR APPOINTMENT.  [phone] AND  PLEASE NOTIFY NUCLEAR MEDICINE AT [facility] AT LEAST 24 HOURS IN ADVANCE IF YOU ARE UNABLE TO KEEP YOUR APPOINTMENT. [phone]  How to prepare for your Myoview test:  1. Do not eat or drink after midnight 2. No caffeine for 24 hours prior to test 3. No smoking 24 hours prior to test. 4. Your medication may be taken with water.  If your doctor stopped a medication because of this test, do not take that medication. 5. Ladies, please do not wear dresses.  Skirts or pants are appropriate. Please wear a short sleeve  shirt. 6. No perfume, cologne or lotion. 7. Wear comfortable walking shoes. No heels!   Follow-Up: Your physician recommends that you schedule a follow-up appointment after testing with Dr. Yvone Neu.  It was a pleasure seeing you today here in the office. Please do not hesitate to give [place] a call back if you have any further questions. [place], BSN    Echocardiogram An echocardiogram, or echocardiography, uses sound waves (ultrasound) to produce an image of your heart. The echocardiogram is simple, painless, obtained within a short period of time, and offers valuable information to your health care provider. The images from an echocardiogram can provide information such as:  Evidence of coronary artery disease (CAD).  Heart size.  Heart muscle function.  Heart valve function.  Aneurysm detection.  Evidence of a past heart attack.  Fluid buildup around the heart.  Heart muscle thickening.  Assess heart valve function. Tell a health care provider about:  Any allergies you have.  All medicines you are taking, including vitamins, herbs, eye drops, creams, and over-the-counter medicines.  Any problems you or family members have had with anesthetic medicines.  Any blood disorders you have.  Any surgeries you have had.  Any medical conditions you have.  Whether you are pregnant or may be pregnant. What happens before the procedure? No special preparation is needed. Eat and drink normally. What happens during the procedure?  In order to produce an image of your heart, gel will be applied to your chest and a wand-like  tool (transducer) will be moved over your chest. The gel will help transmit the sound waves from the transducer. The sound waves will harmlessly bounce off your heart to allow the heart images to be captured in real-time motion. These images will then be recorded.  You may need an IV to receive a medicine that improves the quality of the  pictures. What happens after the procedure? You may return to your normal schedule including diet, activities, and medicines, unless your health care provider tells you otherwise. This information is not intended to replace advice given to you by your health care provider. Make sure you discuss any questions you have with your health care provider. Document Released: 03/20/2000 Document Revised: 11/09/2015 Document Reviewed: 11/28/2012 Elsevier Interactive Patient Education  2017 [employer]. Cardiac Nuclear Scanning A cardiac nuclear scan is used to check your heart for problems, such as the following:  A portion of the heart is not getting enough blood.  Part of the heart muscle has died, which happens with a heart attack.  The heart wall is not working normally.  In this test, a radioactive dye (tracer) is injected into your bloodstream. After the tracer has traveled to your heart, a scanning device is used to measure how much of the tracer is absorbed by or distributed to various areas of your heart. LET [facility] CARE PROVIDER KNOW ABOUT:  Any allergies you have.  All medicines you are taking, including vitamins, herbs, eye drops, creams, and over-the-counter medicines.  Previous problems you or members of your family have had with the use of anesthetics.  Any blood disorders you have.  Previous surgeries you have had.  Medical conditions you have.  RISKS AND COMPLICATIONS Generally, this is a safe procedure. However, as with any procedure, problems can occur. Possible problems include:   Serious chest pain.  Rapid heartbeat.  Sensation of warmth in your chest. This usually passes quickly. BEFORE THE PROCEDURE Ask your health care provider about changing or stopping your regular medicines. PROCEDURE This procedure is usually done at a hospital and takes 2-4 hours.  An IV tube is inserted into one of your veins.  Your health care provider will inject a small  amount of radioactive tracer through the tube.  You will then wait for 20-40 minutes while the tracer travels through your bloodstream.  You will lie down on an exam table so images of your heart can be taken. Images will be taken for about 15-20 minutes.  You will exercise on a treadmill or stationary bike. While you exercise, your heart activity will be monitored with an electrocardiogram (ECG), and your blood pressure will be checked.  If you are unable to exercise, you may be given a medicine to make your heart beat faster.  When blood flow to your heart has peaked, tracer will again be injected through the IV tube.  After 20-40 minutes, you will get back on the exam table and have more images taken of your heart.  When the procedure is over, your IV tube will be removed. AFTER THE PROCEDURE  You will likely be able to leave shortly after the test. Unless your health care provider tells you otherwise, you may return to your normal schedule, including diet, activities, and medicines.  Make sure you find out how and when you will get your test results. This information is not intended to replace advice given to you by your health care provider. Make sure you discuss any questions you have with  your health care provider. Document Released: 04/17/2004 Document Revised: 03/28/2013 Document Reviewed: 03/01/2013 Elsevier Interactive Patient Education  2017 [employer].

## 2016-05-22 ENCOUNTER — Telehealth: Payer: Self-pay | Admitting: *Deleted

## 2016-05-22 NOTE — Telephone Encounter (Signed)
Left detailed voicemail message that we would need to reschedule his stress test pending insurance approval and to please give [place] a call back to reschedule.

## 2016-05-22 NOTE — Telephone Encounter (Signed)
-----   Message from [facility] sent at 05/22/2016 11:36 AM EST ----- Regarding: FW: MPI 05-26-16 Ok to r/s is they don't approve in time? ----- Message ----- From: Valora Corporal, RN Sent: 05/22/2016  10:49 AM To: Tonette Lederer Subject: RE: MPI 05-26-16                                Dr. Yvone Neu is out of the office until the 20th. I am not sure if she will see this before then. I do know that she wanted this patient to have nuclear test due to risk factors and symptoms.   Thanks  ----- Message ----- From: Lillia Pauls Sent: 05/22/2016  10:02 AM To: Wende Bushy, MD, Valora Corporal, RN Subject: MPI 05-26-16                                    Dr. Yvone Neu  In order to expedite and have approved before test date do you want to call and do review?  If not may or may not be approved in time.  If not approved in time for test ok to r/s until auth decision is made?   Do you want to change to GXT?    Thanks  [place]  Based on the clinical information provided, this request has not demonstrated consistency with evidence-based clinical guidelines. Your Notification number request has been assigned Case Number [id number].  A Physician-to-Physician discussion is required to complete the Notification Process. The ordering physician must call [phone] and select option #3 to engage in a Physician-to-Physician discussion. Please ensure the physician has the case number provided above available when making this call. If a Physician-to-Physician discussion is not conducted within 3 business days, this notification number request will be deemed expired and you must reinitiate the Notification Process. ----- Message ----- From: Valora Corporal, RN Sent: 05/21/2016   5:25 PM To: Rebeca Alert [facility] [address] Pre Cert/Auth Subject: Precert                                        Precert Exercise Myoview 05/26/16 07:30AM [facility] Ordered by Dr. Yvone Neu  Thanks, Lesleigh Noe.

## 2016-05-25 ENCOUNTER — Telehealth: Payer: Self-pay | Admitting: Cardiology

## 2016-05-25 ENCOUNTER — Telehealth: Payer: Self-pay | Admitting: Family Medicine

## 2016-05-25 NOTE — Telephone Encounter (Signed)
Per [facility] no significant arrhythmias  -  Patient aware of results via vm

## 2016-05-25 NOTE — Telephone Encounter (Signed)
Per Caryl Pina in [place], pt [id number] myoview needs rescheduling; awaiting MD review. Cancelled w/[place] in Nuc Med Left detailed VM with CB number on home phone. Routed to [facility], RN to make aware.

## 2016-05-25 NOTE — Telephone Encounter (Signed)
Pt returned call and verbalized understanding to await insurance authorization before proceeding w/myoview.

## 2016-05-25 NOTE — Telephone Encounter (Signed)
Received incoming call from patient. Checked with Dorian Pod and request for precert withdrawn from insurance until response from Dr Yvone Neu on doing MD review or changing to GXT.  Instructed patient we will be in contact with him once we determine next steps for patient. He verbalized understanding.

## 2016-05-26 NOTE — Telephone Encounter (Signed)
Spoke with patient and rescheduled stress test for Friday the 23rd at 09:00AM and to arrive at 08:45AM to register. Instructed him to follow same instructions as before and to call back if he should have any further questions. He verbalized understanding with no further questions at this time.

## 2016-05-29 ENCOUNTER — Encounter
Admission: RE | Admit: 2016-05-29 | Discharge: 2016-05-29 | Disposition: A | Discharge status: Home / Self Care | Payer: 59 | Source: Ambulatory Visit | Attending: Cardiology | Admitting: Cardiology

## 2016-05-29 DIAGNOSIS — R079 Chest pain, unspecified: Secondary | ICD-10-CM | POA: Diagnosis not present

## 2016-05-29 DIAGNOSIS — R0602 Shortness of breath: Secondary | ICD-10-CM | POA: Diagnosis not present

## 2016-05-29 LAB — NM MYOCAR MULTI W/SPECT W/WALL MOTION / EF
CHL CUP NUCLEAR SSS: 1
CHL CUP RESTING HR STRESS: 63 {beats}/min
CHL CUP STRESS STAGE 1 SPEED: 0 mph
CHL CUP STRESS STAGE 2 DBP: 69 mmHg
CHL CUP STRESS STAGE 2 GRADE: 10 %
CHL CUP STRESS STAGE 2 HR: 131 {beats}/min
CHL CUP STRESS STAGE 2 SBP: 170 mmHg
CHL CUP STRESS STAGE 3 DBP: 70 mmHg
CHL CUP STRESS STAGE 3 SBP: 194 mmHg
CHL CUP STRESS STAGE 4 HR: 179 {beats}/min
CHL CUP STRESS STAGE 6 GRADE: 0 %
CHL CUP STRESS STAGE 6 SPEED: 0 mph
CHL CUP STRESS STAGE 7 DBP: 83 mmHg
CHL CUP STRESS STAGE 7 HR: 111 {beats}/min
CHL CUP STRESS STAGE 7 SBP: 140 mmHg
CSEPPHR: 190 {beats}/min
CSEPPMHR: 113 %
Estimated workload: 11.9 METS
Exercise duration (min): 10 min
Exercise duration (sec): 7 s
LV dias vol: 55 mL (ref 62–150)
LV sys vol: 17 mL
MPHR: 168 {beats}/min
Percent HR: 113 %
SDS: 0
SRS: 1
Stage 1 Grade: 0 %
Stage 1 HR: 92 {beats}/min
Stage 2 Speed: 1.7 mph
Stage 3 Grade: 12 %
Stage 3 HR: 153 {beats}/min
Stage 3 Speed: 2.5 mph
Stage 4 DBP: 67 mmHg
Stage 4 Grade: 14 %
Stage 4 SBP: 184 mmHg
Stage 4 Speed: 3.4 mph
Stage 5 Grade: 16 %
Stage 5 HR: 190 {beats}/min
Stage 5 Speed: 4.2 mph
Stage 6 HR: 173 {beats}/min
Stage 7 Grade: 0 %
Stage 7 Speed: 0 mph
TID: 0.61

## 2016-05-29 MED ORDER — TECHNETIUM TC 99M TETROFOSMIN IV KIT
30.0000 | PACK | Freq: Once | INTRAVENOUS | Status: AC | PRN
  Administered 2016-05-29: 30.49 via INTRAVENOUS

## 2016-05-29 MED ORDER — TECHNETIUM TC 99M TETROFOSMIN IV KIT
13.0000 | PACK | Freq: Once | INTRAVENOUS | Status: AC | PRN
  Administered 2016-05-29: 13.33 via INTRAVENOUS

## 2016-06-04 ENCOUNTER — Telehealth: Payer: Self-pay | Admitting: Family Medicine

## 2016-06-04 ENCOUNTER — Other Ambulatory Visit: Payer: Self-pay | Admitting: Family Medicine

## 2016-06-04 MED ORDER — ROSUVASTATIN CALCIUM 5 MG PO TABS: ORAL_TABLET | ORAL | 2 refills | Status: DC

## 2016-06-04 NOTE — Telephone Encounter (Signed)
Medication sent to pharm and patient aware of providers recommendations.

## 2016-06-04 NOTE — Telephone Encounter (Signed)
-----   Message from Susy Frizzle, MD sent at 06/02/2016  8:29 PM EST ----- I would like to try the patient on Crestor 5 mg on Monday Wednesday Friday to see if he can tolerate the medication.  ----- Message ----- From: Wende Bushy, MD Sent: 06/02/2016  10:25 AM To: Susy Frizzle, MD  Hello Dr. Dennard Schaumann! We were working with our clinical pharmacist to see if this patient would be eligible for the PCS K9 inhibitors. Stress testing came back as negative. So far, he doesn't have either of the FDA approved indications for PCSK9i (ASCVD or familial hyperlipidemia) so insurance won't cover the drug (repatha or praluent) .I know you have been working with him to see which statins he can use without having significant elevation in his LFTs. It may be worth retrying low dose Crestor 5mg  2-3 days a week with close LFT follow up.   Best, Wende Bushy, MD

## 2016-06-11 ENCOUNTER — Ambulatory Visit (INDEPENDENT_AMBULATORY_CARE_PROVIDER_SITE_OTHER): Payer: 59

## 2016-06-11 ENCOUNTER — Other Ambulatory Visit: Payer: Self-pay

## 2016-06-11 DIAGNOSIS — R0602 Shortness of breath: Secondary | ICD-10-CM

## 2016-06-11 DIAGNOSIS — R079 Chest pain, unspecified: Secondary | ICD-10-CM | POA: Diagnosis not present

## 2016-06-16 ENCOUNTER — Ambulatory Visit (INDEPENDENT_AMBULATORY_CARE_PROVIDER_SITE_OTHER): Payer: 59 | Admitting: Cardiology

## 2016-06-16 ENCOUNTER — Encounter: Payer: Self-pay | Admitting: Cardiology

## 2016-06-16 ENCOUNTER — Ambulatory Visit: Payer: 59 | Admitting: Cardiology

## 2016-06-16 VITALS — BP 128/56 | HR 56 | Ht 72.0 in | Wt 218.5 lb

## 2016-06-16 DIAGNOSIS — I10 Essential (primary) hypertension: Secondary | ICD-10-CM | POA: Diagnosis not present

## 2016-06-16 DIAGNOSIS — E782 Mixed hyperlipidemia: Secondary | ICD-10-CM | POA: Diagnosis not present

## 2016-06-16 NOTE — Patient Instructions (Signed)
Follow-Up: Your physician recommends that you schedule a follow-up appointment as needed.   It was a pleasure seeing you today here in the office. Please do not hesitate to give us a call back if you have any further questions. [phone]  Pamela A. RN, BSN    

## 2016-06-16 NOTE — Progress Notes (Signed)
Cardiology Office Note   Date:  06/16/2016   ID:  STEFANO TRULSON, DOB 31-May-1965, MRN [id number]  Referring Doctor:  Odette Fraction, MD   Cardiologist:   Wende Bushy, MD   Reason for consultation:  Chief Complaint  Patient presents with  . other     Follow up after testing myoview, ECHO, And nuclear scanning. Pt states BP and HR has improved since starting metoprolol.  Reviewed meds with pt verbally.      History of Present Illness: Darryl Ross is a 51 y.o. male who presents for Follow-up after testing  And starting metoprolol, patient has been feeling much better. Blood pressure is better. Heart rate is significantly improved.  No chest pains or shortness of breath.  Per last visit to 15 2018: Patient has a history of hyperlipidemia. He has tried several statins including rosuvastatin or crest her, atorvastatin or Lipitor, pravastatin or pravachol. He has not tolerated any of these statins due to abnormal LFTs in the > 100- 300s.   Patient denies PND, orthopnea, edema. No true syncope.  ROS:  Please see the history of present illness. Aside from mentioned under HPI, all other systems are reviewed and negative.    Past Medical History:  Diagnosis Date  . Anxiety   . Arthritis    hands, neck  . Degenerative disc disease, cervical    no limitations  . Hypercholesteremia   . Hyperlipidemia   . Hypertension    neg stress test several yrs ago  . Lymphocytic colitis     Past Surgical History:  Procedure Laterality Date  . COLONOSCOPY WITH PROPOFOL N/A 03/04/2015   Procedure: COLONOSCOPY WITH PROPOFOL;  Surgeon: Lucilla Lame, MD;  Location: [facility];  Service: Endoscopy;  Laterality: N/A;  . HAND SURGERY     fracture repair     reports that he has never smoked. He has never used smokeless tobacco. He reports that he drinks about 2.4 oz of alcohol per week . He reports that he does not use drugs.   family history includes Arrhythmia in his  father; Hyperlipidemia in his mother; Hypertension in his father.   Outpatient Medications Prior to Visit  Medication Sig Dispense Refill  . ALPRAZolam (XANAX) 0.5 MG tablet TAKE 1 TABLET BY MOUTH AT BEDTIME AS NEEDED FOR ANXIETY 30 tablet 0  . aspirin 81 MG tablet Take 81 mg by mouth daily.    . Glucosamine-Chondroitin (OSTEO BI-FLEX REGULAR STRENGTH PO) Take 2 tablets by mouth daily.    Marland Kitchen lisinopril (PRINIVIL,ZESTRIL) 10 MG tablet TAKE 1 TABLET (10 MG TOTAL) BY MOUTH DAILY. 90 tablet 3  . metoprolol succinate (TOPROL-XL) 25 MG 24 hr tablet Take 1 tablet (25 mg total) by mouth daily. 90 tablet 3  . Multiple Vitamin (MULTIVITAMIN) tablet Take 1 tablet by mouth daily.    . Omega-3 Fatty Acids (FISH OIL) 1000 MG CAPS Take 2,000 mg by mouth daily.    Marland Kitchen PARoxetine (PAXIL) 20 MG tablet Take 1 tablet (20 mg total) by mouth daily. 90 tablet 3  . rosuvastatin (CRESTOR) 5 MG tablet 1 tablet po Monday, Wednesday & Friday only 30 tablet 2  . budesonide (ENTOCORT EC) 3 MG 24 hr capsule TAKE 3 CAPSULES (9 MG TOTAL) BY MOUTH EVERY MORNING. (Patient not taking: Reported on 06/16/2016) 90 capsule 2   No facility-administered medications prior to visit.      Allergies: Morphine and related and Statins    PHYSICAL EXAM: VS:  BP (!) 128/56 (  BP Location: Left Arm, Patient Position: Sitting, Cuff Size: Normal)   Pulse (!) 56   Ht 6' (1.829 m)   Wt 218 lb 8 oz (99.1 kg)   BMI 29.63 kg/m  , Body mass index is 29.63 kg/m. Wt Readings from Last 3 Encounters:  06/16/16 218 lb 8 oz (99.1 kg)  05/21/16 219 lb 8 oz (99.6 kg)  05/14/16 222 lb (100.7 kg)  GENERAL:  well developed, well nourished, obese, not in acute distress HEENT: normocephalic, pink conjunctivae, anicteric sclerae, no xanthelasma, normal dentition, oropharynx clear NECK:  no neck vein engorgement, JVP normal, no hepatojugular reflux, carotid upstroke brisk and symmetric, no bruit, no thyromegaly, no lymphadenopathy LUNGS:  good respiratory  effort, clear to auscultation bilaterally CV:  PMI not displaced, no thrills, no lifts, S1 and S2 within normal limits, no palpable S3 or S4, no murmurs, no rubs, no gallops ABD:  Soft, nontender, nondistended, normoactive bowel sounds, no abdominal aortic bruit, no hepatomegaly, no splenomegaly MS: nontender back, no kyphosis, no scoliosis, no joint deformities EXT:  2+ DP/PT pulses, no edema, no varicosities, no cyanosis, no clubbing SKIN: warm, nondiaphoretic, normal turgor, no ulcers NEUROPSYCH: alert, oriented to person, place, and time, sensory/motor grossly intact, normal mood, appropriate affect    Recent Labs: 02/07/2016: ALT 36; BUN 11; Creat 1.13; Hemoglobin 15.3; Platelets 263; Potassium 4.6; Sodium 139; TSH 2.51   Lipid Panel    Component Value Date/Time   CHOL 257 (H) 02/07/2016 0830   TRIG 336 (H) 02/07/2016 0830   HDL 41 02/07/2016 0830   CHOLHDL 6.3 (H) 02/07/2016 0830   VLDL 67 (H) 02/07/2016 0830   LDLCALC 149 (H) 02/07/2016 0830     Other studies Reviewed:  EKG:  The ekg from 05/21/2016 was personally reviewed by me and it revealed sinus rhythm, 71 BPM  Additional studies/ records that were reviewed personally reviewed by me today include:  Echo 06/11/2016: Left ventricle: The cavity size was normal. Wall thickness was   normal. Systolic function was normal. The estimated ejection   fraction was in the range of 55% to 60%. Wall motion was normal;   there were no regional wall motion abnormalities. Left   ventricular diastolic function parameters were normal.  Nuclear stress test 05/29/2016: Exercise myocardial perfusion imaging study with no significant  ischemia Normal wall motion, EF estimated at 63% No EKG changes concerning for ischemia at peak stress or in recovery. Target heart rate achieved Low risk scan   ASSESSMENT AND PLAN: Chest pain Presyncope Uncontrolled hypertension recently Shortness of breath with exertion He has significant  symptoms, together with risk factors for CAD including age, gender, hypertension, hyperlipidemia that is untreated. No evidence of ischemia on stress test. LVEF normal. Discuss findings with patient. Likelihood of clinically significant CAD is low. Patient reassured. Patient verbalized understanding. Continue with risk factor modification.  Hypertension BP is well controlled. Continue monitoring BP. Continue current medical therapy and lifestyle changes.  Hyperlipidemia Patient on Crestor 5 mg Monday Wednesday Friday. PCP will likely be checking repeat labs in 3 months time. Patient to follow-up with [place] if lipid not controlled or if LFTs significantly elevated with this kind of dosing with Crestor. Current medicines are reviewed at length with the patient today.  The patient does not have concerns regarding medicines.  Labs/ tests ordered today include:  No orders of the defined types were placed in this encounter.   I had a lengthy and detailed discussion with the patient regarding diagnoses, prognosis, diagnostic options,  treatment options , and side effects of medications.   I counseled the patient on importance of lifestyle modification including heart healthy diet, regular physical activity .   I spent at least 25 minutes with the patient today and more than 50% of the time was spent counseling the patient and coordinating care.       Disposition:   FU with cardiology as needed    Signed, Wende Bushy, MD  06/16/2016 5:15 PM    [place]  This note was generated in part with voice recognition software and I apologize for any typographical errors that were not detected and corrected.

## 2016-06-21 ENCOUNTER — Other Ambulatory Visit: Payer: Self-pay | Admitting: Family Medicine

## 2016-06-22 NOTE — Telephone Encounter (Signed)
LRF 12/05/15 #30  LOV 05/14/16  OK refill?

## 2016-06-22 NOTE — Telephone Encounter (Signed)
ok 

## 2016-06-25 NOTE — Telephone Encounter (Signed)
RX called in .

## 2016-07-13 ENCOUNTER — Telehealth: Payer: Self-pay | Admitting: Family Medicine

## 2016-07-13 NOTE — Telephone Encounter (Signed)
Pt wants to know if he can discontinue his crestor. Please advise.

## 2016-07-13 NOTE — Telephone Encounter (Signed)
Crestor is causing the same myalgias the other statins did.  Told OK to stop taking.  Please advise if you want to do something else.

## 2016-07-14 NOTE — Telephone Encounter (Signed)
Dc crestor and try zetia 10 poqday

## 2016-07-14 NOTE — Telephone Encounter (Signed)
Pt on Zetia in 2016.  Was discontinued because elevated LFT's

## 2016-07-14 NOTE — Telephone Encounter (Signed)
This would be his only option, would he be willing to try again?  Praluent or repatha would be extremely expensive and not covered by insurance.

## 2016-07-16 NOTE — Telephone Encounter (Signed)
Patient aware of providers recommendations and refuses the Zetia and would like to just try diet and exercise.

## 2016-08-27 DIAGNOSIS — S060X0A Concussion without loss of consciousness, initial encounter: Secondary | ICD-10-CM | POA: Diagnosis not present

## 2016-08-27 DIAGNOSIS — R55 Syncope and collapse: Secondary | ICD-10-CM | POA: Diagnosis not present

## 2016-08-27 DIAGNOSIS — T1490XA Injury, unspecified, initial encounter: Secondary | ICD-10-CM | POA: Diagnosis not present

## 2016-08-27 DIAGNOSIS — R42 Dizziness and giddiness: Secondary | ICD-10-CM | POA: Diagnosis not present

## 2016-09-02 ENCOUNTER — Other Ambulatory Visit: Payer: Self-pay | Admitting: Family Medicine

## 2016-09-03 ENCOUNTER — Ambulatory Visit (INDEPENDENT_AMBULATORY_CARE_PROVIDER_SITE_OTHER): Payer: 59 | Admitting: Family Medicine

## 2016-09-03 ENCOUNTER — Encounter: Payer: Self-pay | Admitting: Family Medicine

## 2016-09-03 VITALS — BP 124/82 | HR 60 | Temp 97.6°F | Resp 14 | Ht 72.0 in | Wt 216.0 lb

## 2016-09-03 DIAGNOSIS — Z09 Encounter for follow-up examination after completed treatment for conditions other than malignant neoplasm: Secondary | ICD-10-CM | POA: Diagnosis not present
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
## 2016-10-02 ENCOUNTER — Other Ambulatory Visit: Payer: Self-pay | Admitting: Family Medicine

## 2016-10-06 ENCOUNTER — Telehealth: Payer: Self-pay | Admitting: Family Medicine

## 2016-10-06 DIAGNOSIS — M542 Cervicalgia: Secondary | ICD-10-CM
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
## 2016-10-06 NOTE — Telephone Encounter (Signed)
Ok, please consult pt there

## 2016-10-08 NOTE — Telephone Encounter (Signed)
Referral placed.

## 2016-10-14 ENCOUNTER — Ambulatory Visit: Payer: 59 | Attending: Family Medicine

## 2016-10-14 DIAGNOSIS — M542 Cervicalgia: Secondary | ICD-10-CM | POA: Diagnosis not present

## 2016-10-14 DIAGNOSIS — M25512 Pain in left shoulder: Secondary | ICD-10-CM

## 2016-10-14 NOTE — Patient Instructions (Signed)
   On your back, nod your head as if you are answering "yes" to a question.    Repeat 10 times, for 3 sets daily.        Sit up straight with your shoulder blades squeezed.    Retract your neck as if you are giving yourself a "double chin."   Hold for 5 seconds.   Repeat 10 times for 3 sets daily comfortably.
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
## 2016-10-19 ENCOUNTER — Ambulatory Visit: Payer: 59

## 2016-10-19 DIAGNOSIS — M542 Cervicalgia: Secondary | ICD-10-CM

## 2016-10-19 DIAGNOSIS — M25512 Pain in left shoulder: Secondary | ICD-10-CM
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
## 2016-10-21 ENCOUNTER — Ambulatory Visit: Payer: 59

## 2016-10-21 DIAGNOSIS — M25512 Pain in left shoulder: Secondary | ICD-10-CM

## 2016-10-21 DIAGNOSIS — M542 Cervicalgia: Secondary | ICD-10-CM
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
## 2016-10-21 NOTE — Patient Instructions (Addendum)
  Perform a chin tuck.   Look up comfortably while maintaining the chin tuck.    Repeat 10 time for 3 sets daily.      Back, head (with chin tuck) against the wall,    Bring your arms up as if you are making a "Milon Dikes" at the wall.    Keep your forearms against the wall to promote retraction of your shoulder blades.    Perform at a pain free range   Repeat 10 times for 3 sets daily.

## 2016-10-28 ENCOUNTER — Ambulatory Visit: Payer: 59

## 2016-10-28 DIAGNOSIS — M542 Cervicalgia: Secondary | ICD-10-CM

## 2016-10-28 DIAGNOSIS — M25512 Pain in left shoulder: Secondary | ICD-10-CM

## 2016-10-28 NOTE — Patient Instructions (Signed)
  Pt was recommended to hold off on the snow angel exercise at the wall as well as chin tuck with cervical extension home exercise. Pt verbalized understanding.
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
## 2016-10-29 ENCOUNTER — Ambulatory Visit (INDEPENDENT_AMBULATORY_CARE_PROVIDER_SITE_OTHER): Payer: 59 | Admitting: Family Medicine

## 2016-10-29 ENCOUNTER — Encounter: Payer: Self-pay | Admitting: Family Medicine

## 2016-10-29 VITALS — BP 118/84 | HR 60 | Temp 98.2°F | Resp 16 | Ht 72.0 in | Wt 220.0 lb

## 2016-10-29 DIAGNOSIS — M2550 Pain in unspecified joint: Secondary | ICD-10-CM

## 2016-10-29 DIAGNOSIS — M25551 Pain in right hip: Secondary | ICD-10-CM | POA: Diagnosis not present
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
## 2016-10-30 ENCOUNTER — Ambulatory Visit: Payer: 59 | Admitting: Family Medicine

## 2016-10-30 ENCOUNTER — Ambulatory Visit
Admission: RE | Admit: 2016-10-30 | Discharge: 2016-10-30 | Disposition: A | Discharge status: Home / Self Care | Payer: 59 | Source: Ambulatory Visit | Attending: Family Medicine | Admitting: Family Medicine

## 2016-10-30 DIAGNOSIS — M25551 Pain in right hip: Secondary | ICD-10-CM | POA: Diagnosis not present

## 2016-10-30 DIAGNOSIS — M2550 Pain in unspecified joint: Secondary | ICD-10-CM

## 2016-10-30 DIAGNOSIS — S79911A Unspecified injury of right hip, initial encounter: Secondary | ICD-10-CM | POA: Diagnosis not present

## 2016-10-30 LAB — LYME ABY, WSTRN BLT IGG & IGM W/BANDS
B BURGDORFERI IGM ABS (IB): NEGATIVE
B burgdorferi IgG Abs (IB): NEGATIVE
LYME DISEASE 28 KD IGG: NONREACTIVE
LYME DISEASE 30 KD IGG: NONREACTIVE
LYME DISEASE 39 KD IGG: NONREACTIVE
LYME DISEASE 41 KD IGG: NONREACTIVE
LYME DISEASE 41 KD IGM: NONREACTIVE
LYME DISEASE 45 KD IGG: NONREACTIVE
LYME DISEASE 58 KD IGG: NONREACTIVE
LYME DISEASE 66 KD IGG: NONREACTIVE
LYME DISEASE 93 KD IGG: NONREACTIVE
Lyme Disease 18 kD IgG: NONREACTIVE
Lyme Disease 23 kD IgG: NONREACTIVE
Lyme Disease 23 kD IgM: NONREACTIVE
Lyme Disease 39 kD IgM: NONREACTIVE

## 2016-10-30 LAB — URIC ACID: URIC ACID, SERUM: 7.2 mg/dL (ref 4.0–8.0)

## 2016-10-30 LAB — RHEUMATOID FACTOR

## 2016-10-30 LAB — ANA: ANA: NEGATIVE

## 2016-10-30 LAB — SEDIMENTATION RATE: Sed Rate: 1 mm/hr (ref 0–20)

## 2016-11-02 ENCOUNTER — Other Ambulatory Visit: Payer: Self-pay | Admitting: Family Medicine

## 2016-11-02 MED ORDER — MELOXICAM 15 MG PO TABS: 15.0000 mg | ORAL_TABLET | Freq: Every day | ORAL | 3 refills | Status: DC

## 2016-11-03 ENCOUNTER — Ambulatory Visit: Payer: 59

## 2016-11-03 DIAGNOSIS — M542 Cervicalgia: Secondary | ICD-10-CM

## 2016-11-03 DIAGNOSIS — M25512 Pain in left shoulder: Secondary | ICD-10-CM

## 2016-11-03 NOTE — Patient Instructions (Signed)
  Resisted External Rotation: in Neutral - Bilateral    Sit or stand, theraband in both hands, elbows at sides, bent to 90, forearms forward. Pinch shoulder blades together and rotate forearms out. Keep elbows at sides. Repeat _10___ times per set. Do _1___ sets per session. Do __3__ sessions per day. Add 5 to 10 second holds if it is too easy. Red band    http://orth.exer.us/966   Copyright  VHI. All rights reserved.
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
## 2016-11-09 ENCOUNTER — Ambulatory Visit: Payer: 59 | Attending: Family Medicine

## 2016-11-09 DIAGNOSIS — M542 Cervicalgia: Secondary | ICD-10-CM | POA: Diagnosis not present

## 2016-11-09 DIAGNOSIS — M25512 Pain in left shoulder: Secondary | ICD-10-CM
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
## 2016-11-09 NOTE — Patient Instructions (Addendum)
  Lying on your back  Tilt chin to your neck.    Lift the back of your head up about 1 inch off the pillow while maintaining your chin position.    Repeat 10 times   Perform 3 sets daily .      Scapular Retraction: Rowing - Arms - 45 Degrees (Resistance Band)   Hold end of blue band in each hand. Pull back until elbows are even with trunk. Squeeze your shoulder blades together.    Use ___blue_____ resistance band. _10__ reps per set, _3__ sets per day. Copyright  [employer]. All rights reserved.

## 2016-11-11 ENCOUNTER — Ambulatory Visit: Payer: 59

## 2016-11-11 DIAGNOSIS — M542 Cervicalgia: Secondary | ICD-10-CM | POA: Diagnosis not present

## 2016-11-11 DIAGNOSIS — M25512 Pain in left shoulder: Secondary | ICD-10-CM
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
## 2016-11-16 ENCOUNTER — Ambulatory Visit: Payer: 59

## 2016-11-16 DIAGNOSIS — M25512 Pain in left shoulder: Secondary | ICD-10-CM

## 2016-11-16 DIAGNOSIS — M542 Cervicalgia: Secondary | ICD-10-CM
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
## 2016-11-18 ENCOUNTER — Ambulatory Visit: Payer: 59

## 2016-11-23 ENCOUNTER — Ambulatory Visit: Payer: 59

## 2016-11-23 DIAGNOSIS — M542 Cervicalgia: Secondary | ICD-10-CM | POA: Diagnosis not present

## 2016-11-23 DIAGNOSIS — M25512 Pain in left shoulder: Secondary | ICD-10-CM

## 2016-11-23 NOTE — Patient Instructions (Addendum)
  Gave supine cervical rotation with chin tuck and tongue pressed at roof of his mouth 10x3 each direction as part of his HEP. Pt demonstrated and verbalized understanding.     Pt was recommended to sit with a lumbar towel roll to promote good low back and neck posture at work. Pt verbalized understanding.
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
## 2016-11-30 ENCOUNTER — Ambulatory Visit: Payer: 59

## 2016-11-30 DIAGNOSIS — M542 Cervicalgia: Secondary | ICD-10-CM | POA: Diagnosis not present
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
## 2016-12-03 ENCOUNTER — Other Ambulatory Visit: Payer: Self-pay | Admitting: Family Medicine

## 2016-12-03 NOTE — Telephone Encounter (Signed)
Ok to refill 

## 2016-12-04 DIAGNOSIS — H524 Presbyopia: Secondary | ICD-10-CM | POA: Diagnosis not present

## 2016-12-04 DIAGNOSIS — H52223 Regular astigmatism, bilateral: Secondary | ICD-10-CM | POA: Diagnosis not present

## 2016-12-04 DIAGNOSIS — H5203 Hypermetropia, bilateral: Secondary | ICD-10-CM | POA: Diagnosis not present

## 2016-12-04 NOTE — Telephone Encounter (Signed)
ok 

## 2016-12-15 ENCOUNTER — Ambulatory Visit: Payer: 59 | Attending: Family Medicine

## 2016-12-15 DIAGNOSIS — M542 Cervicalgia: Secondary | ICD-10-CM | POA: Diagnosis not present

## 2016-12-15 DIAGNOSIS — M25512 Pain in left shoulder: Secondary | ICD-10-CM

## 2016-12-15 NOTE — Patient Instructions (Signed)
  Lying on your back, rest your head on a deflated ball.    Press your tongue at the roof of your mouth.   Perform a chin tuck.    Turn your head to the right and left comfortably 10 times for 3 sets daily      Nod your head as if you are saying "yes" to a question for 1 minute, 3 times daily.
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
## 2016-12-22 ENCOUNTER — Ambulatory Visit: Payer: 59

## 2016-12-22 DIAGNOSIS — M542 Cervicalgia: Secondary | ICD-10-CM

## 2016-12-22 DIAGNOSIS — M25512 Pain in left shoulder: Secondary | ICD-10-CM
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
## 2017-02-09 ENCOUNTER — Other Ambulatory Visit: Payer: Self-pay

## 2017-02-09 ENCOUNTER — Other Ambulatory Visit: Payer: 59

## 2017-02-09 DIAGNOSIS — Z114 Encounter for screening for human immunodeficiency virus [HIV]: Secondary | ICD-10-CM

## 2017-02-09 DIAGNOSIS — Z0000 Encounter for general adult medical examination without abnormal findings: Secondary | ICD-10-CM

## 2017-02-09 NOTE — Progress Notes (Signed)
erro  neous encounter

## 2017-02-09 NOTE — Addendum Note (Signed)
Addended by: Stephens Shire on: 02/09/2017 09:22 AM   Modules accepted: Orders

## 2017-02-10 LAB — LIPID PANEL
CHOL/HDL RATIO: 5.6 (calc) — AB (ref <5.0)
Cholesterol: 271 mg/dL — ABNORMAL HIGH (ref <200)
HDL: 48 mg/dL (ref >40)
LDL CHOLESTEROL (CALC): 191 mg/dL — AB
Non-HDL Cholesterol (Calc): 223 mg/dL (calc) — ABNORMAL HIGH (ref <130)
TRIGLYCERIDES: 159 mg/dL — AB (ref <150)

## 2017-02-10 LAB — CBC WITH DIFFERENTIAL/PLATELET
BASOS ABS: 38 {cells}/uL (ref 0–200)
Basophils Relative: 0.8 %
EOS PCT: 2.9 %
Eosinophils Absolute: 139 cells/uL (ref 15–500)
HEMATOCRIT: 43.3 % (ref 38.5–50.0)
Hemoglobin: 14.9 g/dL (ref 13.2–17.1)
LYMPHS ABS: 1310 {cells}/uL (ref 850–3900)
MCH: 31.3 pg (ref 27.0–33.0)
MCHC: 34.4 g/dL (ref 32.0–36.0)
MCV: 91 fL (ref 80.0–100.0)
MPV: 10.6 fL (ref 7.5–12.5)
Monocytes Relative: 9.6 %
NEUTROS PCT: 59.4 %
Neutro Abs: 2851 cells/uL (ref 1500–7800)
Platelets: 266 10*3/uL (ref 140–400)
RBC: 4.76 10*6/uL (ref 4.20–5.80)
RDW: 12.4 % (ref 11.0–15.0)
Total Lymphocyte: 27.3 %
WBC mixed population: 461 cells/uL (ref 200–950)
WBC: 4.8 10*3/uL (ref 3.8–10.8)

## 2017-02-10 LAB — COMPLETE METABOLIC PANEL WITH GFR
AG Ratio: 2 (calc) (ref 1.0–2.5)
ALBUMIN MSPROF: 4.5 g/dL (ref 3.6–5.1)
ALT: 31 U/L (ref 9–46)
AST: 18 U/L (ref 10–35)
Alkaline phosphatase (APISO): 39 U/L — ABNORMAL LOW (ref 40–115)
BUN: 16 mg/dL (ref 7–25)
CALCIUM: 9.4 mg/dL (ref 8.6–10.3)
CO2: 26 mmol/L (ref 20–32)
CREATININE: 1.32 mg/dL (ref 0.70–1.33)
Chloride: 102 mmol/L (ref 98–110)
GFR, EST AFRICAN AMERICAN: 71 mL/min/{1.73_m2} (ref >=60)
GFR, Est Non African American: 61 mL/min/{1.73_m2} (ref >=60)
GLUCOSE: 100 mg/dL — AB (ref 65–99)
Globulin: 2.2 g/dL (calc) (ref 1.9–3.7)
Potassium: 5.1 mmol/L (ref 3.5–5.3)
Sodium: 138 mmol/L (ref 135–146)
TOTAL PROTEIN: 6.7 g/dL (ref 6.1–8.1)
Total Bilirubin: 0.7 mg/dL (ref 0.2–1.2)

## 2017-02-10 LAB — PSA: PSA: 0.3 ng/mL (ref <=4.0)

## 2017-02-10 LAB — HIV ANTIBODY (ROUTINE TESTING W REFLEX): HIV: NONREACTIVE

## 2017-02-10 LAB — TSH: TSH: 1.67 mIU/L (ref 0.40–4.50)

## 2017-02-12 ENCOUNTER — Encounter: Payer: 59 | Admitting: Family Medicine

## 2017-02-16 ENCOUNTER — Encounter: Payer: Self-pay | Admitting: Family Medicine

## 2017-02-16 ENCOUNTER — Ambulatory Visit (INDEPENDENT_AMBULATORY_CARE_PROVIDER_SITE_OTHER): Payer: 59 | Admitting: Family Medicine

## 2017-02-16 VITALS — BP 126/84 | HR 80 | Temp 98.0°F | Resp 14 | Ht 72.0 in | Wt 217.0 lb

## 2017-02-16 DIAGNOSIS — M25551 Pain in right hip: Secondary | ICD-10-CM

## 2017-02-16 DIAGNOSIS — I10 Essential (primary) hypertension: Secondary | ICD-10-CM

## 2017-02-16 DIAGNOSIS — E7800 Pure hypercholesterolemia, unspecified: Secondary | ICD-10-CM | POA: Diagnosis not present

## 2017-02-16 DIAGNOSIS — Z0000 Encounter for general adult medical examination without abnormal findings: Secondary | ICD-10-CM

## 2017-02-16 MED ORDER — PRAVASTATIN SODIUM 20 MG PO TABS: 20.0000 mg | ORAL_TABLET | Freq: Every day | ORAL | 3 refills | Status: DC

## 2017-02-16 NOTE — Progress Notes (Signed)
Subjective:    Patient ID: Darryl Ross, male    DOB: March 21, 1966, 51 y.o.   MRN: [id number]  HPI Patient is here for complete physical exam. He continues to report pain in the posterior right hip near SI joint.  Normal hip xray.  Mobic is no help.  No radiation of the pain down his right leg.  No neuropathy or radiculopathy.  Patient had a colonoscopy 2016 that  showed no evidence of colon cancer. Recently had a PSA that was 0.3.  Prostate cancer screening is up-to-date. Flu shot and tetanus shot are up-to-date. Most recent lab work is listed below: Lab on 02/09/2017  Component Date Value Ref Range Status  . WBC 02/09/2017 4.8  3.8 - 10.8 Thousand/uL Final  . RBC 02/09/2017 4.76  4.20 - 5.80 Million/uL Final  . Hemoglobin 02/09/2017 14.9  13.2 - 17.1 g/dL Final  . HCT 02/09/2017 43.3  38.5 - 50.0 % Final  . MCV 02/09/2017 91.0  80.0 - 100.0 fL Final  . MCH 02/09/2017 31.3  27.0 - 33.0 pg Final  . MCHC 02/09/2017 34.4  32.0 - 36.0 g/dL Final  . RDW 02/09/2017 12.4  11.0 - 15.0 % Final  . Platelets 02/09/2017 266  140 - 400 Thousand/uL Final  . MPV 02/09/2017 10.6  7.5 - 12.5 fL Final  . Neutro Abs 02/09/2017 2,851  1,500 - 7,800 cells/uL Final  . Lymphs Abs 02/09/2017 1,310  850 - 3,900 cells/uL Final  . WBC mixed population 02/09/2017 461  200 - 950 cells/uL Final  . Eosinophils Absolute 02/09/2017 139  15 - 500 cells/uL Final  . Basophils Absolute 02/09/2017 38  0 - 200 cells/uL Final  . Neutrophils Relative % 02/09/2017 59.4  % Final  . Total Lymphocyte 02/09/2017 27.3  % Final  . Monocytes Relative 02/09/2017 9.6  % Final  . Eosinophils Relative 02/09/2017 2.9  % Final  . Basophils Relative 02/09/2017 0.8  % Final  . Glucose, Bld 02/09/2017 100* 65 - 99 mg/dL Final   Comment: .            Fasting reference interval . For someone without known diabetes, a glucose value between 100 and 125 mg/dL is consistent with prediabetes and should be confirmed with a follow-up  test. .   . BUN 02/09/2017 16  7 - 25 mg/dL Final  . Creat 02/09/2017 1.32  0.70 - 1.33 mg/dL Final   Comment: For patients >41 years of age, the reference limit for Creatinine is approximately 13% higher for people identified as African-American. .   . GFR, Est Non African American 02/09/2017 61  > OR = 60 mL/min/1.43m2 Final  . GFR, Est African American 02/09/2017 71  > OR = 60 mL/min/1.57m2 Final  . BUN/Creatinine Ratio 78/93/8101 NOT APPLICABLE  6 - 22 (calc) Final  . Sodium 02/09/2017 138  135 - 146 mmol/L Final  . Potassium 02/09/2017 5.1  3.5 - 5.3 mmol/L Final  . Chloride 02/09/2017 102  98 - 110 mmol/L Final  . CO2 02/09/2017 26  20 - 32 mmol/L Final  . Calcium 02/09/2017 9.4  8.6 - 10.3 mg/dL Final  . Total Protein 02/09/2017 6.7  6.1 - 8.1 g/dL Final  . Albumin 02/09/2017 4.5  3.6 - 5.1 g/dL Final  . Globulin 02/09/2017 2.2  1.9 - 3.7 g/dL (calc) Final  . AG Ratio 02/09/2017 2.0  1.0 - 2.5 (calc) Final  . Total Bilirubin 02/09/2017 0.7  0.2 - 1.2 mg/dL Final  .  Alkaline phosphatase (APISO) 02/09/2017 39* 40 - 115 U/L Final  . AST 02/09/2017 18  10 - 35 U/L Final  . ALT 02/09/2017 31  9 - 46 U/L Final  . Cholesterol 02/09/2017 271* <200 mg/dL Final  . HDL 02/09/2017 48  >40 mg/dL Final  . Triglycerides 02/09/2017 159* <150 mg/dL Final  . LDL Cholesterol (Calc) 02/09/2017 191* mg/dL (calc) Final   Comment: LDL-C levels > or = 190 mg/dL may indicate familial  hypercholesterolemia (FH). Clinical assessment and  measurement of blood lipid levels should be  considered for all first degree relatives of  patients with an FH diagnosis.  For questions about testing for familial hypercholesterolemia, please call Insurance risk surveyor at [email or web address]. Duncan Dull, et al. J National Lipid Association  Recommendations for Patient-Centered Management of  Dyslipidemia: Part 1 Journal of Clinical Lipidology  2015;9(2), 129-169. Reference range: <100 . Desirable range  <100 mg/dL for primary prevention;   <70 mg/dL for patients with CHD or diabetic patients  with > or = 2 CHD risk factors. Marland Kitchen LDL-C is now calculated using the Martin-Hopkins  calculation, which is a validated novel method providing  better accuracy than the Friedewald equation in the  estimation of LDL-C.  Cresenciano Genre et al. Annamaria Helling. 5176;160(73): 2061-2068  (http://education.[facility].com/faq/FAQ164)   . Total CHOL/HDL Ratio 02/09/2017 5.6* <5.0 (calc) Final  . Non-HDL Cholesterol (Calc) 02/09/2017 223* <130 mg/dL (calc) Final   Comment: Non-HDL level > or = 220 is very high and may indicate  genetic familial hypercholesterolemia (FH). Clinical  assessment and measurement of blood lipid levels  should be considered for all first-degree relatives  of patients with an FH diagnosis. . For patients with diabetes plus 1 major ASCVD risk  factor, treating to a non-HDL-C goal of <100 mg/dL  (LDL-C of <70 mg/dL) is considered a therapeutic  option.   Marland Kitchen TSH 02/09/2017 1.67  0.40 - 4.50 mIU/L Final  . PSA 02/09/2017 0.3  < OR = 4.0 ng/mL Final   Comment: The total PSA value from this assay system is  standardized against the WHO standard. The test  result will be approximately 20% lower when compared  to the equimolar-standardized total PSA (Beckman  Coulter). Comparison of serial PSA results should be  interpreted with this fact in mind. . This test was performed using the Siemens  chemiluminescent method. Values obtained from  different assay methods cannot be used interchangeably. PSA levels, regardless of value, should not be interpreted as absolute evidence of the presence or absence of disease.   Marland Kitchen HIV 1&2 Ab, 4th Generation 02/09/2017 NON-REACTIVE  NON-REACTI Final   Comment: HIV-1 antigen and HIV-1/HIV-2 antibodies were not detected. There is no laboratory evidence of HIV infection. Marland Kitchen PLEASE NOTE: This information has been disclosed to you from records whose  confidentiality may be protected by state law.  If your state requires such protection, then the state law prohibits you from making any further disclosure of the information without the specific written consent of the person to whom it pertains, or as otherwise permitted by law. A general authorization for the release of medical or other information is NOT sufficient for this purpose. . For additional information please refer to http://education.questdiagnostics.com/faq/FAQ106 (This link is being provided for informational/ educational purposes only.) . Marland Kitchen The performance of this assay has not been clinically validated in patients less than 52 years old. .     Past Medical History:  Diagnosis Date  . Anxiety   . Arthritis  hands, neck  . Degenerative disc disease, cervical    no limitations  . Hypercholesteremia   . Hyperlipidemia   . Hypertension    neg stress test several yrs ago  . Lymphocytic colitis    Past Surgical History:  Procedure Laterality Date  . HAND SURGERY     fracture repair   Current Outpatient Medications on File Prior to Visit  Medication Sig Dispense Refill  . ALPRAZolam (XANAX) 0.5 MG tablet TAKE 1 TABLET BY MOUTH AT BEDTIME AS NEEDED 30 tablet 2  . aspirin 81 MG tablet Take 81 mg by mouth daily.    . budesonide (ENTOCORT EC) 3 MG 24 hr capsule TAKE 3 CAPSULES (9 MG TOTAL) BY MOUTH EVERY MORNING. (Patient taking differently: TAKE 3 CAPSULES (9 MG TOTAL) BY MOUTH EVERY MORNING (PRN Medication)) 90 capsule 1  . Glucosamine-Chondroitin (OSTEO BI-FLEX REGULAR STRENGTH PO) Take 2 tablets by mouth daily.    . meloxicam (MOBIC) 15 MG tablet Take 1 tablet (15 mg total) by mouth daily. 30 tablet 3  . metoprolol succinate (TOPROL-XL) 25 MG 24 hr tablet Take 1 tablet (25 mg total) by mouth daily. 90 tablet 3  . Multiple Vitamin (MULTIVITAMIN) tablet Take 1 tablet by mouth daily.    . Omega-3 Fatty Acids (FISH OIL) 1000 MG CAPS Take 2,000 mg by mouth daily.     Marland Kitchen PARoxetine (PAXIL) 20 MG tablet Take 1 tablet (20 mg total) by mouth daily. 90 tablet 3   No current facility-administered medications on file prior to visit.    Allergies  Allergen Reactions  . Morphine And Related Nausea Only  . Statins Other (See Comments)    Effected liver enzymes  . Zetia [Ezetimibe]     Elevated LFT's   Social History   Socioeconomic History  . Marital status: Married    Spouse name: Not on file  . Number of children: Not on file  . Years of education: Not on file  . Highest education level: Not on file  Social Needs  . Financial resource strain: Not on file  . Food insecurity - worry: Not on file  . Food insecurity - inability: Not on file  . Transportation needs - medical: Not on file  . Transportation needs - non-medical: Not on file  Occupational History  . Not on file  Tobacco Use  . Smoking status: Never Smoker  . Smokeless tobacco: Never Used  Substance and Sexual Activity  . Alcohol use: Yes    Alcohol/week: 2.4 oz    Types: 3 Cans of beer, 1 Shots of liquor per week    Comment: occasional  . Drug use: No  . Sexual activity: Not on file    Comment: avid runner and cyclist.  Married.  Other Topics Concern  . Not on file  Social History Narrative  . Not on file   Family History  Problem Relation Age of Onset  . Hyperlipidemia Mother   . Arrhythmia Father        A-Fib   . Hypertension Father       Review of Systems  All other systems reviewed and are negative.      Objective:   Physical Exam  Constitutional: He is oriented to person, place, and time. He appears well-developed and well-nourished. No distress.  HENT:  Head: Normocephalic and atraumatic.  Right Ear: External ear normal.  Left Ear: External ear normal.  Nose: Nose normal.  Mouth/Throat: Oropharynx is clear and moist. No oropharyngeal exudate.  Eyes:  Conjunctivae and EOM are normal. Pupils are equal, round, and reactive to light. Right eye exhibits no  discharge. Left eye exhibits no discharge. No scleral icterus.  Neck: Normal range of motion. Neck supple. No JVD present. No tracheal deviation present. No thyromegaly present.  Cardiovascular: Normal rate, regular rhythm, normal heart sounds and intact distal pulses. Exam reveals no gallop and no friction rub.  No murmur heard. Pulmonary/Chest: Effort normal and breath sounds normal. No stridor. No respiratory distress. He has no wheezes. He has no rales. He exhibits no tenderness.  Abdominal: Soft. Bowel sounds are normal. He exhibits no distension and no mass. There is no tenderness. There is no rebound and no guarding.  Genitourinary: Penis normal. Right testis shows no mass and no tenderness. Left testis shows no mass and no tenderness.  Musculoskeletal: Normal range of motion. He exhibits no edema or tenderness.  Lymphadenopathy:    He has no cervical adenopathy.  Neurological: He is alert and oriented to person, place, and time. He has normal reflexes. No cranial nerve deficit. He exhibits normal muscle tone. Coordination normal.  Skin: Skin is warm. No rash noted. He is not diaphoretic. No erythema. No pallor.  Psychiatric: He has a normal mood and affect. His behavior is normal. Judgment and thought content normal.  Vitals reviewed.         Assessment & Plan:  Routine general medical examination at a health care facility  Benign essential HTN  Pure hypercholesterolemia  Right hip pain - Plan: Ambulatory referral to Orthopedic Surgery  exam today is completely normal except for his elevated LDL cholesterol.  He is tried Lipitor and Crestor in the past but discontinued them due to elevated liver function test or polyarthralgias/myalgias.  We had a long discussion about the risk of untreated hyperlipidemia.  I recommended trying pravastatin 20 mg a day and rechecking fasting lipid panel and liver function test in 3 months.  Immunizations are up-to-date except for his shingles  vaccine which I recommended.  Colon cancer screening is up-to-date.  PSA is excellent.  I believe the pain in his posterior right hip is not due to the hip but rather to SI joint irritation.  I would refer the patient to orthopedic surgery for possible SI joint injection because I do not perform these.  I do not believe this is lumbar radiculopathy.  I recommended discontinuation of NSAIDs given the fact his creatinine is steadily worsening

## 2017-03-01 ENCOUNTER — Ambulatory Visit (INDEPENDENT_AMBULATORY_CARE_PROVIDER_SITE_OTHER): Payer: 59 | Admitting: Orthopaedic Surgery

## 2017-03-02 ENCOUNTER — Other Ambulatory Visit: Payer: Self-pay | Admitting: Family Medicine

## 2017-03-04 DIAGNOSIS — M47816 Spondylosis without myelopathy or radiculopathy, lumbar region: Secondary | ICD-10-CM | POA: Insufficient documentation

## 2017-03-04 DIAGNOSIS — M4696 Unspecified inflammatory spondylopathy, lumbar region: Secondary | ICD-10-CM | POA: Diagnosis not present

## 2017-03-04 DIAGNOSIS — M461 Sacroiliitis, not elsewhere classified: Secondary | ICD-10-CM | POA: Diagnosis not present

## 2017-03-24 DIAGNOSIS — M461 Sacroiliitis, not elsewhere classified: Secondary | ICD-10-CM | POA: Diagnosis not present

## 2017-03-24 DIAGNOSIS — M4696 Unspecified inflammatory spondylopathy, lumbar region: Secondary | ICD-10-CM | POA: Diagnosis not present

## 2017-04-16 ENCOUNTER — Telehealth: Payer: Self-pay | Admitting: Family Medicine

## 2017-04-16 NOTE — Telephone Encounter (Signed)
Pt called and states that he is unable to take the Pravastatin also d/t muscle pain. (med list updated)

## 2017-05-05 ENCOUNTER — Other Ambulatory Visit: Payer: Self-pay | Admitting: Family Medicine

## 2017-06-17 DIAGNOSIS — M9901 Segmental and somatic dysfunction of cervical region: Secondary | ICD-10-CM | POA: Diagnosis not present

## 2017-06-17 DIAGNOSIS — M9902 Segmental and somatic dysfunction of thoracic region: Secondary | ICD-10-CM | POA: Diagnosis not present

## 2017-06-17 DIAGNOSIS — M791 Myalgia, unspecified site: Secondary | ICD-10-CM | POA: Diagnosis not present

## 2017-06-23 DIAGNOSIS — M9901 Segmental and somatic dysfunction of cervical region: Secondary | ICD-10-CM | POA: Diagnosis not present

## 2017-06-23 DIAGNOSIS — M9902 Segmental and somatic dysfunction of thoracic region: Secondary | ICD-10-CM | POA: Diagnosis not present

## 2017-06-23 DIAGNOSIS — M791 Myalgia, unspecified site: Secondary | ICD-10-CM | POA: Diagnosis not present

## 2017-08-13 ENCOUNTER — Other Ambulatory Visit: Payer: Self-pay | Admitting: *Deleted

## 2017-08-13 DIAGNOSIS — M549 Dorsalgia, unspecified: Secondary | ICD-10-CM

## 2017-08-13 DIAGNOSIS — M542 Cervicalgia: Secondary | ICD-10-CM

## 2017-08-31 ENCOUNTER — Telehealth: Payer: Self-pay | Admitting: Family Medicine

## 2017-08-31 NOTE — Telephone Encounter (Signed)
Sent patient

## 2017-09-01 NOTE — Telephone Encounter (Signed)
Please disregard this telephone call and see referral to neurosurgery for further documentation.

## 2017-09-03 ENCOUNTER — Ambulatory Visit
Admission: RE | Admit: 2017-09-03 | Discharge: 2017-09-03 | Disposition: A | Discharge status: Home / Self Care | Payer: 59 | Source: Ambulatory Visit | Attending: Family Medicine | Admitting: Family Medicine

## 2017-09-03 ENCOUNTER — Ambulatory Visit: Payer: 59 | Admitting: Family Medicine

## 2017-09-03 ENCOUNTER — Encounter: Payer: Self-pay | Admitting: Family Medicine

## 2017-09-03 VITALS — BP 112/70 | HR 52 | Temp 97.7°F | Resp 16 | Ht 72.0 in | Wt 199.0 lb

## 2017-09-03 DIAGNOSIS — M542 Cervicalgia: Secondary | ICD-10-CM | POA: Diagnosis not present
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
## 2017-09-07 ENCOUNTER — Other Ambulatory Visit: Payer: Self-pay | Admitting: Family Medicine

## 2017-09-07 DIAGNOSIS — M5030 Other cervical disc degeneration, unspecified cervical region: Secondary | ICD-10-CM

## 2017-09-30 ENCOUNTER — Ambulatory Visit
Admission: RE | Admit: 2017-09-30 | Discharge: 2017-09-30 | Disposition: A | Discharge status: Home / Self Care | Payer: 59 | Source: Ambulatory Visit | Attending: Family Medicine | Admitting: Family Medicine

## 2017-09-30 DIAGNOSIS — M542 Cervicalgia: Secondary | ICD-10-CM | POA: Diagnosis not present

## 2017-09-30 DIAGNOSIS — M5030 Other cervical disc degeneration, unspecified cervical region: Secondary | ICD-10-CM

## 2017-10-08 ENCOUNTER — Other Ambulatory Visit: Payer: Self-pay | Admitting: Family Medicine

## 2017-10-08 DIAGNOSIS — M5030 Other cervical disc degeneration, unspecified cervical region: Secondary | ICD-10-CM

## 2017-10-11 DIAGNOSIS — M5030 Other cervical disc degeneration, unspecified cervical region: Secondary | ICD-10-CM | POA: Insufficient documentation

## 2017-10-27 ENCOUNTER — Other Ambulatory Visit: Payer: Self-pay | Admitting: Family Medicine

## 2017-10-28 DIAGNOSIS — M5030 Other cervical disc degeneration, unspecified cervical region: Secondary | ICD-10-CM | POA: Diagnosis not present

## 2017-11-22 DIAGNOSIS — M5030 Other cervical disc degeneration, unspecified cervical region: Secondary | ICD-10-CM | POA: Diagnosis not present

## 2017-11-22 DIAGNOSIS — M4696 Unspecified inflammatory spondylopathy, lumbar region: Secondary | ICD-10-CM | POA: Diagnosis not present

## 2018-02-18 DIAGNOSIS — H18413 Arcus senilis, bilateral: Secondary | ICD-10-CM | POA: Diagnosis not present

## 2018-02-18 DIAGNOSIS — I10 Essential (primary) hypertension: Secondary | ICD-10-CM | POA: Diagnosis not present

## 2018-02-18 DIAGNOSIS — H524 Presbyopia: Secondary | ICD-10-CM | POA: Diagnosis not present

## 2018-02-22 ENCOUNTER — Other Ambulatory Visit: Payer: 59

## 2018-02-22 DIAGNOSIS — E7800 Pure hypercholesterolemia, unspecified: Secondary | ICD-10-CM | POA: Diagnosis not present

## 2018-02-22 DIAGNOSIS — Z79899 Other long term (current) drug therapy: Secondary | ICD-10-CM

## 2018-02-22 DIAGNOSIS — Z1211 Encounter for screening for malignant neoplasm of colon: Secondary | ICD-10-CM

## 2018-02-22 DIAGNOSIS — I10 Essential (primary) hypertension: Secondary | ICD-10-CM | POA: Diagnosis not present

## 2018-02-23 LAB — COMPREHENSIVE METABOLIC PANEL
AG Ratio: 2.7 (calc) — ABNORMAL HIGH (ref 1.0–2.5)
ALBUMIN MSPROF: 4.6 g/dL (ref 3.6–5.1)
ALKALINE PHOSPHATASE (APISO): 39 U/L — AB (ref 40–115)
ALT: 25 U/L (ref 9–46)
AST: 23 U/L (ref 10–35)
BILIRUBIN TOTAL: 0.8 mg/dL (ref 0.2–1.2)
BUN: 14 mg/dL (ref 7–25)
CALCIUM: 9.4 mg/dL (ref 8.6–10.3)
CHLORIDE: 102 mmol/L (ref 98–110)
CO2: 29 mmol/L (ref 20–32)
Creat: 1.11 mg/dL (ref 0.70–1.33)
GLOBULIN: 1.7 g/dL — AB (ref 1.9–3.7)
Glucose, Bld: 92 mg/dL (ref 65–99)
POTASSIUM: 5 mmol/L (ref 3.5–5.3)
Sodium: 138 mmol/L (ref 135–146)
Total Protein: 6.3 g/dL (ref 6.1–8.1)

## 2018-02-23 LAB — LIPID PANEL
Cholesterol: 265 mg/dL — ABNORMAL HIGH (ref <200)
HDL: 59 mg/dL (ref >40)
LDL Cholesterol (Calc): 186 mg/dL (calc) — ABNORMAL HIGH
Non-HDL Cholesterol (Calc): 206 mg/dL (calc) — ABNORMAL HIGH (ref <130)
Total CHOL/HDL Ratio: 4.5 (calc) (ref <5.0)
Triglycerides: 90 mg/dL (ref <150)

## 2018-02-23 LAB — CBC WITH DIFFERENTIAL/PLATELET
BASOS PCT: 0.6 %
Basophils Absolute: 32 cells/uL (ref 0–200)
EOS PCT: 2.5 %
Eosinophils Absolute: 133 cells/uL (ref 15–500)
HCT: 45.2 % (ref 38.5–50.0)
HEMOGLOBIN: 15.4 g/dL (ref 13.2–17.1)
Lymphs Abs: 1208 cells/uL (ref 850–3900)
MCH: 31.3 pg (ref 27.0–33.0)
MCHC: 34.1 g/dL (ref 32.0–36.0)
MCV: 91.9 fL (ref 80.0–100.0)
MONOS PCT: 11.1 %
MPV: 10.9 fL (ref 7.5–12.5)
NEUTROS ABS: 3339 {cells}/uL (ref 1500–7800)
Neutrophils Relative %: 63 %
PLATELETS: 257 10*3/uL (ref 140–400)
RBC: 4.92 10*6/uL (ref 4.20–5.80)
RDW: 12.7 % (ref 11.0–15.0)
TOTAL LYMPHOCYTE: 22.8 %
WBC mixed population: 588 cells/uL (ref 200–950)
WBC: 5.3 10*3/uL (ref 3.8–10.8)

## 2018-02-23 LAB — PSA: PSA: 0.3 ng/mL (ref <=4.0)

## 2018-02-25 ENCOUNTER — Ambulatory Visit (INDEPENDENT_AMBULATORY_CARE_PROVIDER_SITE_OTHER): Payer: 59 | Admitting: Family Medicine

## 2018-02-25 ENCOUNTER — Encounter: Payer: Self-pay | Admitting: Family Medicine

## 2018-02-25 ENCOUNTER — Encounter: Payer: 59 | Admitting: Family Medicine

## 2018-02-25 VITALS — BP 122/80 | HR 56 | Temp 98.0°F | Resp 14 | Ht 72.0 in | Wt 202.0 lb

## 2018-02-25 DIAGNOSIS — E7800 Pure hypercholesterolemia, unspecified: Secondary | ICD-10-CM | POA: Diagnosis not present

## 2018-02-25 DIAGNOSIS — Z0000 Encounter for general adult medical examination without abnormal findings: Secondary | ICD-10-CM

## 2018-02-25 MED ORDER — ROSUVASTATIN CALCIUM 5 MG PO TABS: 5.0000 mg | ORAL_TABLET | Freq: Every day | ORAL | 3 refills | Status: DC

## 2018-02-25 NOTE — Progress Notes (Signed)
Subjective:    Patient ID: Darryl Ross, male    DOB: June 08, 1965, 52 y.o.   MRN: [id number]  HPI Patient is here for complete physical exam.  Colonoscopy is up-to-date.  He is not due again until 2024.  Flu shot is up-to-date.  Tetanus shot is up-to-date.  He is not due for the shingles vaccine or the pneumonia vaccine until 58 and 65 respectively.  PSA is within normal limits.  As his lab work demonstrates below his cholesterol is extremely high.  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 Lab on 02/22/2018  Component Date Value Ref Range Status  . WBC 02/22/2018 5.3  3.8 - 10.8 Thousand/uL Final  . RBC 02/22/2018 4.92  4.20 - 5.80 Million/uL Final  . Hemoglobin 02/22/2018 15.4  13.2 - 17.1 g/dL Final  . HCT 02/22/2018 45.2  38.5 - 50.0 % Final  . MCV 02/22/2018 91.9  80.0 - 100.0 fL Final  . MCH 02/22/2018 31.3  27.0 - 33.0 pg Final  . MCHC 02/22/2018 34.1  32.0 - 36.0 g/dL Final  . RDW 02/22/2018 12.7  11.0 - 15.0 % Final  . Platelets 02/22/2018 257  140 - 400 Thousand/uL Final  . MPV 02/22/2018 10.9  7.5 - 12.5 fL Final  . Neutro Abs 02/22/2018 3,339  1,500 - 7,800 cells/uL Final  . Lymphs Abs 02/22/2018 1,208  850 - 3,900 cells/uL Final  . WBC mixed population 02/22/2018 588  200 - 950 cells/uL Final  . Eosinophils Absolute 02/22/2018 133  15 - 500 cells/uL Final  . Basophils Absolute 02/22/2018 32  0 - 200 cells/uL Final  . Neutrophils Relative % 02/22/2018 63  % Final  . Total Lymphocyte 02/22/2018 22.8  % Final  . Monocytes Relative 02/22/2018 11.1  % Final  . Eosinophils Relative 02/22/2018 2.5  % Final  . Basophils Relative 02/22/2018 0.6  % Final  . Glucose, Bld 02/22/2018 92  65 - 99 mg/dL Final   Comment: .            Fasting reference interval .   . BUN 02/22/2018 14  7 - 25 mg/dL Final  . Creat 02/22/2018 1.11  0.70 - 1.33 mg/dL Final   Comment: For patients >83 years of age, the reference limit for Creatinine is approximately 13% higher for  people identified as African-American. .   Havery Moros Ratio 53/61/4431 NOT APPLICABLE  6 - 22 (calc) Final  . Sodium 02/22/2018 138  135 - 146 mmol/L Final  . Potassium 02/22/2018 5.0  3.5 - 5.3 mmol/L Final  . Chloride 02/22/2018 102  98 - 110 mmol/L Final  . CO2 02/22/2018 29  20 - 32 mmol/L Final  . Calcium 02/22/2018 9.4  8.6 - 10.3 mg/dL Final  . Total Protein 02/22/2018 6.3  6.1 - 8.1 g/dL Final  . Albumin 02/22/2018 4.6  3.6 - 5.1 g/dL Final  . Globulin 02/22/2018 1.7* 1.9 - 3.7 g/dL (calc) Final  . AG Ratio 02/22/2018 2.7* 1.0 - 2.5 (calc) Final  . Total Bilirubin 02/22/2018 0.8  0.2 - 1.2 mg/dL Final  . Alkaline phosphatase (APISO) 02/22/2018 39* 40 - 115 U/L Final  . AST 02/22/2018 23  10 - 35 U/L Final  . ALT 02/22/2018 25  9 - 46 U/L Final  . Cholesterol 02/22/2018 265* <200 mg/dL Final  . HDL 02/22/2018 59  >40 mg/dL Final  . Triglycerides 02/22/2018 90  <150 mg/dL Final  . LDL Cholesterol (Calc) 02/22/2018 186*  mg/dL (calc) Final   Comment: Reference range: <100 . Desirable range <100 mg/dL for primary prevention;   <70 mg/dL for patients with CHD or diabetic patients  with > or = 2 CHD risk factors. Marland Kitchen LDL-C is now calculated using the Martin-Hopkins  calculation, which is a validated novel method providing  better accuracy than the Friedewald equation in the  estimation of LDL-C.  Cresenciano Genre et al. Annamaria Helling. 5366;440(34): 2061-2068  (http://education.[facility].com/faq/FAQ164)   . Total CHOL/HDL Ratio 02/22/2018 4.5  <5.0 (calc) Final  . Non-HDL Cholesterol (Calc) 02/22/2018 206* <130 mg/dL (calc) Final   Comment: For patients with diabetes plus 1 major ASCVD risk  factor, treating to a non-HDL-C goal of <100 mg/dL  (LDL-C of <70 mg/dL) is considered a therapeutic  option.   Marland Kitchen PSA 02/22/2018 0.3  < OR = 4.0 ng/mL Final   Comment: The total PSA value from this assay system is  standardized against the WHO standard. The test  result will be  approximately 20% lower when compared  to the equimolar-standardized total PSA (Beckman  Coulter). Comparison of serial PSA results should be  interpreted with this fact in mind. . This test was performed using the Siemens  chemiluminescent method. Values obtained from  different assay methods cannot be used interchangeably. PSA levels, regardless of value, should not be interpreted as absolute evidence of the presence or absence of disease.     Past Medical History:  Diagnosis Date  . Anxiety   . Arthritis    hands, neck  . Degenerative disc disease, cervical    no limitations  . Hypercholesteremia   . Hyperlipidemia   . Hypertension    neg stress test several yrs ago  . Lymphocytic colitis    Past Surgical History:  Procedure Laterality Date  . COLONOSCOPY WITH PROPOFOL N/A 03/04/2015   Procedure: COLONOSCOPY WITH PROPOFOL;  Surgeon: Lucilla Lame, MD;  Location: [place];  Service: Endoscopy;  Laterality: N/A;  . HAND SURGERY     fracture repair   Current Outpatient Medications on File Prior to Visit  Medication Sig Dispense Refill  . ALPRAZolam (XANAX) 0.5 MG tablet TAKE 1 TABLET BY MOUTH AT BEDTIME AS NEEDED 30 tablet 2  . aspirin 81 MG tablet Take 81 mg by mouth daily.    . budesonide (ENTOCORT EC) 3 MG 24 hr capsule TAKE 3 CAPSULES (9 MG TOTAL) BY MOUTH EVERY MORNING. (Patient taking differently: Take 9 mg by mouth as needed. ) 90 capsule 1  . Glucosamine-Chondroitin (OSTEO BI-FLEX REGULAR STRENGTH PO) Take 2 tablets by mouth daily.    . metoprolol succinate (TOPROL-XL) 25 MG 24 hr tablet TAKE 1 TABLET (25 MG TOTAL) BY MOUTH DAILY. 90 tablet 3  . Multiple Vitamin (MULTIVITAMIN) tablet Take 1 tablet by mouth daily.    . Omega-3 Fatty Acids (FISH OIL) 1000 MG CAPS Take 2,000 mg by mouth daily.    Marland Kitchen PARoxetine (PAXIL) 20 MG tablet TAKE 1 TABLET BY MOUTH EVERY DAY 90 tablet 3   No current facility-administered medications on file prior to visit.    Allergies   Allergen Reactions  . Morphine And Related Nausea Only  . Statins Other (See Comments)    Effected liver enzymes  . Zetia [Ezetimibe]     Elevated LFT's   Social History   Socioeconomic History  . Marital status: Married    Spouse name: Not on file  . Number of children: Not on file  . Years of education: Not on file  .  Highest education level: Not on file  Occupational History  . Not on file  Social Needs  . Financial resource strain: Not on file  . Food insecurity:    Worry: Not on file    Inability: Not on file  . Transportation needs:    Medical: Not on file    Non-medical: Not on file  Tobacco Use  . Smoking status: Never Smoker  . Smokeless tobacco: Never Used  Substance and Sexual Activity  . Alcohol use: Yes    Alcohol/week: 4.0 standard drinks    Types: 3 Cans of beer, 1 Shots of liquor per week    Comment: occasional  . Drug use: No  . Sexual activity: Not on file    Comment: avid runner and cyclist.  Married.  Lifestyle  . Physical activity:    Days per week: Not on file    Minutes per session: Not on file  . Stress: Not on file  Relationships  . Social connections:    Talks on phone: Not on file    Gets together: Not on file    Attends religious service: Not on file    Active member of club or organization: Not on file    Attends meetings of clubs or organizations: Not on file    Relationship status: Not on file  . Intimate partner violence:    Fear of current or ex partner: Not on file    Emotionally abused: Not on file    Physically abused: Not on file    Forced sexual activity: Not on file  Other Topics Concern  . Not on file  Social History Narrative  . Not on file   Family History  Problem Relation Age of Onset  . Hyperlipidemia Mother   . Arrhythmia Father        A-Fib   . Hypertension Father       Review of Systems  All other systems reviewed and are negative.      Objective:   Physical Exam  Constitutional: He is  oriented to person, place, and time. He appears well-developed and well-nourished. No distress.  HENT:  Head: Normocephalic and atraumatic.  Right Ear: External ear normal.  Left Ear: External ear normal.  Nose: Nose normal.  Mouth/Throat: Oropharynx is clear and moist. No oropharyngeal exudate.  Eyes: Pupils are equal, round, and reactive to light. Conjunctivae and EOM are normal. Right eye exhibits no discharge. Left eye exhibits no discharge. No scleral icterus.  Neck: Normal range of motion. Neck supple. No JVD present. No tracheal deviation present. No thyromegaly present.  Cardiovascular: Normal rate, regular rhythm, normal heart sounds and intact distal pulses. Exam reveals no gallop and no friction rub.  No murmur heard. Pulmonary/Chest: Effort normal and breath sounds normal. No stridor. No respiratory distress. He has no wheezes. He has no rales. He exhibits no tenderness.  Abdominal: Soft. Bowel sounds are normal. He exhibits no distension and no mass. There is no tenderness. There is no rebound and no guarding.  Musculoskeletal: Normal range of motion. He exhibits no edema or tenderness.  Lymphadenopathy:    He has no cervical adenopathy.  Neurological: He is alert and oriented to person, place, and time. He has normal reflexes. No cranial nerve deficit. He exhibits normal muscle tone. Coordination normal.  Skin: Skin is warm. No rash noted. He is not diaphoretic. No erythema. No pallor.  Psychiatric: He has a normal mood and affect. His behavior is normal. Judgment and thought content  normal.  Vitals reviewed.         Assessment & Plan:  Pure hypercholesterolemia  Routine general medical examination at a health care facility  Patient eats extremely healthfully.  He exercises daily.  He is the pitcher of physical health however his cholesterol is terrible.  I recommended trying Crestor 5 mg once a week and slowly increasing the dose to whatever dose he can tolerate.  I  recommended trying coenzyme Q 10 along with that.  Otherwise his immunizations are up-to-date.  His colon cancer screening is up-to-date.  His prostate cancer screening is up-to-date and the remainder of his lab work is excellent.  Blood pressure is excellent.  Recheck fasting lipid panel and CMP 3 months after he is reached steady state on his statin

## 2018-05-01 ENCOUNTER — Other Ambulatory Visit: Payer: Self-pay | Admitting: Family Medicine

## 2018-05-20 DIAGNOSIS — L718 Other rosacea: Secondary | ICD-10-CM | POA: Diagnosis not present

## 2018-05-20 DIAGNOSIS — D2261 Melanocytic nevi of right upper limb, including shoulder: Secondary | ICD-10-CM | POA: Diagnosis not present

## 2018-05-20 DIAGNOSIS — D225 Melanocytic nevi of trunk: Secondary | ICD-10-CM | POA: Diagnosis not present

## 2018-11-02 ENCOUNTER — Other Ambulatory Visit: Payer: Self-pay | Admitting: Family Medicine

## 2018-11-04 ENCOUNTER — Other Ambulatory Visit: Payer: Self-pay | Admitting: Family Medicine

## 2018-11-04 MED ORDER — PAROXETINE HCL 20 MG PO TABS: 20.0000 mg | ORAL_TABLET | Freq: Every day | ORAL | 3 refills | Status: DC

## 2018-11-15 ENCOUNTER — Other Ambulatory Visit: Payer: Self-pay | Admitting: Family Medicine

## 2018-11-15 ENCOUNTER — Other Ambulatory Visit: Payer: Self-pay | Admitting: *Deleted

## 2018-11-15 MED ORDER — ALPRAZOLAM 0.5 MG PO TABS: 0.5000 mg | ORAL_TABLET | Freq: Every evening | ORAL | 2 refills | Status: DC | PRN

## 2018-11-15 NOTE — Telephone Encounter (Signed)
Received call from patient spouse, Bethena Roys. Requested refill on Xanax.   Ok to refill??  Last office visit 02/25/2018.  Last refill 12/04/2016, #2 refills.

## 2019-03-07 ENCOUNTER — Other Ambulatory Visit: Payer: Self-pay

## 2019-03-07 ENCOUNTER — Other Ambulatory Visit: Payer: 59

## 2019-03-07 DIAGNOSIS — Z0000 Encounter for general adult medical examination without abnormal findings: Secondary | ICD-10-CM

## 2019-03-07 DIAGNOSIS — R972 Elevated prostate specific antigen [PSA]: Secondary | ICD-10-CM

## 2019-03-07 DIAGNOSIS — E7800 Pure hypercholesterolemia, unspecified: Secondary | ICD-10-CM

## 2019-03-08 LAB — CBC WITH DIFFERENTIAL/PLATELET
Absolute Monocytes: 529 cells/uL (ref 200–950)
Basophils Absolute: 41 cells/uL (ref 0–200)
Basophils Relative: 0.9 %
Eosinophils Absolute: 147 cells/uL (ref 15–500)
Eosinophils Relative: 3.2 %
HCT: 45.7 % (ref 38.5–50.0)
Hemoglobin: 15.2 g/dL (ref 13.2–17.1)
Lymphs Abs: 1187 cells/uL (ref 850–3900)
MCH: 31.5 pg (ref 27.0–33.0)
MCHC: 33.3 g/dL (ref 32.0–36.0)
MCV: 94.8 fL (ref 80.0–100.0)
MPV: 11.2 fL (ref 7.5–12.5)
Monocytes Relative: 11.5 %
Neutro Abs: 2696 cells/uL (ref 1500–7800)
Neutrophils Relative %: 58.6 %
Platelets: 244 10*3/uL (ref 140–400)
RBC: 4.82 10*6/uL (ref 4.20–5.80)
RDW: 12.1 % (ref 11.0–15.0)
Total Lymphocyte: 25.8 %
WBC: 4.6 10*3/uL (ref 3.8–10.8)

## 2019-03-08 LAB — LIPID PANEL
Cholesterol: 190 mg/dL (ref <200)
HDL: 56 mg/dL (ref >=40)
LDL Cholesterol (Calc): 112 mg/dL (calc) — ABNORMAL HIGH
Non-HDL Cholesterol (Calc): 134 mg/dL (calc) — ABNORMAL HIGH (ref <130)
Total CHOL/HDL Ratio: 3.4 (calc) (ref <5.0)
Triglycerides: 111 mg/dL (ref <150)

## 2019-03-08 LAB — COMPLETE METABOLIC PANEL WITH GFR
AG Ratio: 2 (calc) (ref 1.0–2.5)
ALT: 30 U/L (ref 9–46)
AST: 26 U/L (ref 10–35)
Albumin: 4.3 g/dL (ref 3.6–5.1)
Alkaline phosphatase (APISO): 42 U/L (ref 35–144)
BUN: 16 mg/dL (ref 7–25)
CO2: 27 mmol/L (ref 20–32)
Calcium: 9.5 mg/dL (ref 8.6–10.3)
Chloride: 105 mmol/L (ref 98–110)
Creat: 1.17 mg/dL (ref 0.70–1.33)
GFR, Est African American: 81 mL/min/{1.73_m2} (ref >=60)
GFR, Est Non African American: 70 mL/min/{1.73_m2} (ref >=60)
Globulin: 2.1 g/dL (calc) (ref 1.9–3.7)
Glucose, Bld: 101 mg/dL — ABNORMAL HIGH (ref 65–99)
Potassium: 5.1 mmol/L (ref 3.5–5.3)
Sodium: 142 mmol/L (ref 135–146)
Total Bilirubin: 0.6 mg/dL (ref 0.2–1.2)
Total Protein: 6.4 g/dL (ref 6.1–8.1)

## 2019-03-08 LAB — PSA: PSA: 0.2 ng/mL (ref <=4.0)

## 2019-03-09 ENCOUNTER — Other Ambulatory Visit: Payer: Self-pay

## 2019-03-10 ENCOUNTER — Encounter: Payer: Self-pay | Admitting: Family Medicine

## 2019-03-10 ENCOUNTER — Ambulatory Visit (INDEPENDENT_AMBULATORY_CARE_PROVIDER_SITE_OTHER): Payer: 59 | Admitting: Family Medicine

## 2019-03-10 VITALS — BP 126/80 | HR 50 | Temp 96.8°F | Resp 14 | Ht 72.0 in | Wt 200.0 lb

## 2019-03-10 DIAGNOSIS — I10 Essential (primary) hypertension: Secondary | ICD-10-CM | POA: Diagnosis not present

## 2019-03-10 DIAGNOSIS — Z0001 Encounter for general adult medical examination with abnormal findings: Secondary | ICD-10-CM | POA: Diagnosis not present

## 2019-03-10 DIAGNOSIS — E7800 Pure hypercholesterolemia, unspecified: Secondary | ICD-10-CM | POA: Diagnosis not present

## 2019-03-10 DIAGNOSIS — Z0000 Encounter for general adult medical examination without abnormal findings: Secondary | ICD-10-CM

## 2019-03-10 NOTE — Progress Notes (Signed)
Subjective:    Patient ID: Darryl Ross, male    DOB: September 13, 1965, 53 y.o.   MRN: [id number]  HPI Patient is here for complete physical exam.  Colonoscopy is up-to-date and was performed in 2016.  Flu shot is up-to-date.  Tetanus shot is up-to-date.  He is not due for the shingles vaccine or the pneumonia vaccine until 58 and 65 respectively.  PSA is within normal limits.  Patient has been exercising vigorously running 20 miles.  His cholesterol has improved dramatically compared to last year.  Labs are listed below: Immunization History  Administered Date(s) Administered  . DTaP 03/06/2009  . Influenza-Unspecified 02/03/2016, 01/23/2017, 12/31/2017, 01/04/2019  . Tdap 11/04/2013    Lab on 03/07/2019  Component Date Value Ref Range Status  . PSA 03/07/2019 0.2  < OR = 4.0 ng/mL Final   Comment: The total PSA value from this assay system is  standardized against the WHO standard. The test  result will be approximately 20% lower when compared  to the equimolar-standardized total PSA (Beckman  Coulter). Comparison of serial PSA results should be  interpreted with this fact in mind. . This test was performed using the Siemens  chemiluminescent method. Values obtained from  different assay methods cannot be used interchangeably. PSA levels, regardless of value, should not be interpreted as absolute evidence of the presence or absence of disease.   . Cholesterol 03/07/2019 190  <200 mg/dL Final  . HDL 03/07/2019 56  > OR = 40 mg/dL Final  . Triglycerides 03/07/2019 111  <150 mg/dL Final  . LDL Cholesterol (Calc) 03/07/2019 112* mg/dL (calc) Final   Comment: Reference range: <100 . Desirable range <100 mg/dL for primary prevention;   <70 mg/dL for patients with CHD or diabetic patients  with > or = 2 CHD risk factors. Marland Kitchen LDL-C is now calculated using the Martin-Hopkins  calculation, which is a validated novel method providing  better accuracy than the Friedewald equation in the   estimation of LDL-C.  Cresenciano Genre et al. Annamaria Helling. [id number]): 2061-2068  (http://education.[facility].com/faq/FAQ164)   . Total CHOL/HDL Ratio 03/07/2019 3.4  <5.0 (calc) Final  . Non-HDL Cholesterol (Calc) 03/07/2019 134* <130 mg/dL (calc) Final   Comment: For patients with diabetes plus 1 major ASCVD risk  factor, treating to a non-HDL-C goal of <100 mg/dL  (LDL-C of <70 mg/dL) is considered a therapeutic  option.   . Glucose, Bld 03/07/2019 101* 65 - 99 mg/dL Final   Comment: .            Fasting reference interval . For someone without known diabetes, a glucose value between 100 and 125 mg/dL is consistent with prediabetes and should be confirmed with a follow-up test. .   . BUN 03/07/2019 16  7 - 25 mg/dL Final  . Creat 03/07/2019 1.17  0.70 - 1.33 mg/dL Final   Comment: For patients >65 years of age, the reference limit for Creatinine is approximately 13% higher for people identified as African-American. .   . GFR, Est Non African American 03/07/2019 70  > OR = 60 mL/min/1.73m2 Final  . GFR, Est African American 03/07/2019 81  > OR = 60 mL/min/1.70m2 Final  . BUN/Creatinine Ratio [id number] NOT APPLICABLE  6 - 22 (calc) Final  . Sodium 03/07/2019 142  135 - 146 mmol/L Final  . Potassium 03/07/2019 5.1  3.5 - 5.3 mmol/L Final  . Chloride 03/07/2019 105  98 - 110 mmol/L Final  . CO2 03/07/2019 27  20 - 32  mmol/L Final  . Calcium 03/07/2019 9.5  8.6 - 10.3 mg/dL Final  . Total Protein 03/07/2019 6.4  6.1 - 8.1 g/dL Final  . Albumin 03/07/2019 4.3  3.6 - 5.1 g/dL Final  . Globulin 03/07/2019 2.1  1.9 - 3.7 g/dL (calc) Final  . AG Ratio 03/07/2019 2.0  1.0 - 2.5 (calc) Final  . Total Bilirubin 03/07/2019 0.6  0.2 - 1.2 mg/dL Final  . Alkaline phosphatase (APISO) 03/07/2019 42  35 - 144 U/L Final  . AST 03/07/2019 26  10 - 35 U/L Final  . ALT 03/07/2019 30  9 - 46 U/L Final  . WBC 03/07/2019 4.6  3.8 - 10.8 Thousand/uL Final  . RBC 03/07/2019 4.82  4.20 - 5.80  Million/uL Final  . Hemoglobin 03/07/2019 15.2  13.2 - 17.1 g/dL Final  . HCT 03/07/2019 45.7  38.5 - 50.0 % Final  . MCV 03/07/2019 94.8  80.0 - 100.0 fL Final  . MCH 03/07/2019 31.5  27.0 - 33.0 pg Final  . MCHC 03/07/2019 33.3  32.0 - 36.0 g/dL Final  . RDW 03/07/2019 12.1  11.0 - 15.0 % Final  . Platelets 03/07/2019 244  140 - 400 Thousand/uL Final  . MPV 03/07/2019 11.2  7.5 - 12.5 fL Final  . Neutro Abs 03/07/2019 2,696  1,500 - 7,800 cells/uL Final  . Lymphs Abs 03/07/2019 1,187  850 - 3,900 cells/uL Final  . Absolute Monocytes 03/07/2019 529  200 - 950 cells/uL Final  . Eosinophils Absolute 03/07/2019 147  15 - 500 cells/uL Final  . Basophils Absolute 03/07/2019 41  0 - 200 cells/uL Final  . Neutrophils Relative % 03/07/2019 58.6  % Final  . Total Lymphocyte 03/07/2019 25.8  % Final  . Monocytes Relative 03/07/2019 11.5  % Final  . Eosinophils Relative 03/07/2019 3.2  % Final  . Basophils Relative 03/07/2019 0.9  % Final    Past Medical History:  Diagnosis Date  . Anxiety   . Arthritis    hands, neck  . Degenerative disc disease, cervical    no limitations  . Hypercholesteremia   . Hyperlipidemia   . Hypertension    neg stress test several yrs ago  . Lymphocytic colitis    Past Surgical History:  Procedure Laterality Date  . COLONOSCOPY WITH PROPOFOL N/A 03/04/2015   Procedure: COLONOSCOPY WITH PROPOFOL;  Surgeon: Lucilla Lame, MD;  Location: [place];  Service: Endoscopy;  Laterality: N/A;  . HAND SURGERY     fracture repair   Current Outpatient Medications on File Prior to Visit  Medication Sig Dispense Refill  . ALPRAZolam (XANAX) 0.5 MG tablet Take 1 tablet (0.5 mg total) by mouth at bedtime as needed. 30 tablet 2  . aspirin 81 MG tablet Take 81 mg by mouth daily.    . budesonide (ENTOCORT EC) 3 MG 24 hr capsule TAKE 3 CAPSULES (9 MG TOTAL) BY MOUTH EVERY MORNING. (Patient taking differently: Take 9 mg by mouth as needed. ) 90 capsule 1  .  Glucosamine-Chondroitin (OSTEO BI-FLEX REGULAR STRENGTH PO) Take 2 tablets by mouth daily.    . metoprolol succinate (TOPROL-XL) 25 MG 24 hr tablet TAKE 1 TABLET BY MOUTH EVERY DAY 90 tablet 3  . Multiple Vitamin (MULTIVITAMIN) tablet Take 1 tablet by mouth daily.    . Omega-3 Fatty Acids (FISH OIL) 1000 MG CAPS Take 2,000 mg by mouth daily.    Marland Kitchen PARoxetine (PAXIL) 20 MG tablet Take 1 tablet (20 mg total) by mouth daily. [place]  tablet 3  . rosuvastatin (CRESTOR) 5 MG tablet Take 1 tablet (5 mg total) by mouth daily. 90 tablet 3   No current facility-administered medications on file prior to visit.    Allergies  Allergen Reactions  . Morphine And Related Nausea Only  . Statins Other (See Comments)    Effected liver enzymes  . Zetia [Ezetimibe]     Elevated LFT's   Social History   Socioeconomic History  . Marital status: Married    Spouse name: Not on file  . Number of children: Not on file  . Years of education: Not on file  . Highest education level: Not on file  Occupational History  . Not on file  Social Needs  . Financial resource strain: Not on file  . Food insecurity    Worry: Not on file    Inability: Not on file  . Transportation needs    Medical: Not on file    Non-medical: Not on file  Tobacco Use  . Smoking status: Never Smoker  . Smokeless tobacco: Never Used  Substance and Sexual Activity  . Alcohol use: Yes    Alcohol/week: 4.0 standard drinks    Types: 3 Cans of beer, 1 Shots of liquor per week    Comment: occasional  . Drug use: No  . Sexual activity: Not on file    Comment: avid runner and cyclist.  Married.  Lifestyle  . Physical activity    Days per week: Not on file    Minutes per session: Not on file  . Stress: Not on file  Relationships  . Social Herbalist on phone: Not on file    Gets together: Not on file    Attends religious service: Not on file    Active member of club or organization: Not on file    Attends meetings of clubs  or organizations: Not on file    Relationship status: Not on file  . Intimate partner violence    Fear of current or ex partner: Not on file    Emotionally abused: Not on file    Physically abused: Not on file    Forced sexual activity: Not on file  Other Topics Concern  . Not on file  Social History Narrative  . Not on file   Family History  Problem Relation Age of Onset  . Hyperlipidemia Mother   . Arrhythmia Father        A-Fib   . Hypertension Father       Review of Systems  All other systems reviewed and are negative.      Objective:   Physical Exam  Constitutional: He is oriented to person, place, and time. He appears well-developed and well-nourished. No distress.  HENT:  Head: Normocephalic and atraumatic.  Right Ear: External ear normal.  Left Ear: External ear normal.  Nose: Nose normal.  Mouth/Throat: Oropharynx is clear and moist. No oropharyngeal exudate.  Eyes: Pupils are equal, round, and reactive to light. Conjunctivae and EOM are normal. Right eye exhibits no discharge. Left eye exhibits no discharge. No scleral icterus.  Neck: Normal range of motion. Neck supple. No JVD present. No tracheal deviation present. No thyromegaly present.  Cardiovascular: Normal rate, regular rhythm, normal heart sounds and intact distal pulses. Exam reveals no gallop and no friction rub.  No murmur heard. Pulmonary/Chest: Effort normal and breath sounds normal. No stridor. No respiratory distress. He has no wheezes. He has no rales. He exhibits no tenderness.  Abdominal: Soft. Bowel sounds are normal. He exhibits no distension and no mass. There is no abdominal tenderness. There is no rebound and no guarding.  Musculoskeletal: Normal range of motion.        General: No tenderness or edema.  Lymphadenopathy:    He has no cervical adenopathy.  Neurological: He is alert and oriented to person, place, and time. He has normal reflexes. No cranial nerve deficit. He exhibits normal  muscle tone. Coordination normal.  Skin: Skin is warm. No rash noted. He is not diaphoretic. No erythema. No pallor.  Psychiatric: He has a normal mood and affect. His behavior is normal. Judgment and thought content normal.  Vitals reviewed.         Assessment & Plan:  Routine general medical examination at a health care facility  Pure hypercholesterolemia  Essential hypertension I am very proud of the patient.  His lab work is excellent.  It is much improved compared to last year.  He had his flu shot outside of the clinic.  I did recommend the shingles vaccine if his insurance will cover it otherwise I will defer this until age 89.  Colonoscopy is not due again until 2026.  PSA is virtually undetectable at 0.2.  The remainder of his preventative care is up-to-date.  Regular anticipatory guidance was provided.  Encouraged him to keep up the good work.

## 2019-04-10 ENCOUNTER — Telehealth: Payer: Self-pay | Admitting: *Deleted

## 2019-04-10 NOTE — Telephone Encounter (Signed)
Received following MyChart message from spouse:  Good Morning.  Could you ask Dr. Samella Parr nurse to send a Mychart activation code for Darryl Ross?  I need to get him set up to be able to schedule a virtual visit with Dr. Dennard Schaumann.  [name] has been ill with Covid since last Monday evening.  He tested positive, ran a fever for days, but has just been sleeping alot and taking Advil.  He can't seem to get rid of the headache pressure behind his eyes, and we are hoping Dr. Dennard Schaumann can call in some medication for him.   I was tested on the morning of 12/31, but have not received results yet.  My mother who lives with [place] now, was hospitalized on 12/31 with Covid, but was discharged Saturday evening. Thanks!  Regards, [name] activation code sent to patient. Will await scheduling of visit.

## 2019-04-11 ENCOUNTER — Ambulatory Visit (INDEPENDENT_AMBULATORY_CARE_PROVIDER_SITE_OTHER): Payer: 59 | Admitting: Family Medicine

## 2019-04-11 DIAGNOSIS — U071 COVID-19: Secondary | ICD-10-CM | POA: Diagnosis not present

## 2019-04-11 NOTE — Progress Notes (Signed)
Subjective:    Patient ID: Darryl Ross, male    DOB: [birth year], 54 y.o.   MRN: [id number]  HPI Patient is a 54 year old Caucasian male being seen today as a telephone visit.  He consents to be seen over the telephone.  Phone call began at 815.  Phone call concluded at 830.  Patient began developing symptoms last Monday.  On Tuesday he was tested for Covid and his test returned positive.  Since last Tuesday he has had a fever off and on between 101 101.  He reports a dull headache daily.  He reports body aches daily.  He has nausea particular when he runs a fever.  He has very little cough and very little runny nose.  Every day he feels little bit better however when the fever spikes at night, his symptoms tend to return.  He denies any shortness of breath.  He denies any chest pain.  He denies any difficulty breathing.  Fevers typically between 101 101 and is controlled as long as he takes Advil.  He denies any delirium or abdominal pain. Past Medical History:  Diagnosis Date  . Anxiety   . Arthritis    hands, neck  . Degenerative disc disease, cervical    no limitations  . Hypercholesteremia   . Hyperlipidemia   . Hypertension    neg stress test several yrs ago  . Lymphocytic colitis    Past Surgical History:  Procedure Laterality Date  . COLONOSCOPY WITH PROPOFOL N/A 03/04/2015   Procedure: COLONOSCOPY WITH PROPOFOL;  Surgeon: Lucilla Lame, MD;  Location: [place];  Service: Endoscopy;  Laterality: N/A;  . HAND SURGERY     fracture repair   Current Outpatient Medications on File Prior to Visit  Medication Sig Dispense Refill  . ALPRAZolam (XANAX) 0.5 MG tablet Take 1 tablet (0.5 mg total) by mouth at bedtime as needed. 30 tablet 2  . aspirin 81 MG tablet Take 81 mg by mouth daily.    . budesonide (ENTOCORT EC) 3 MG 24 hr capsule TAKE 3 CAPSULES (9 MG TOTAL) BY MOUTH EVERY MORNING. (Patient taking differently: Take 9 mg by mouth as needed. ) 90 capsule 1  .  Glucosamine-Chondroitin (OSTEO BI-FLEX REGULAR STRENGTH PO) Take 2 tablets by mouth daily.    . metoprolol succinate (TOPROL-XL) 25 MG 24 hr tablet TAKE 1 TABLET BY MOUTH EVERY DAY 90 tablet 3  . Multiple Vitamin (MULTIVITAMIN) tablet Take 1 tablet by mouth daily.    . Omega-3 Fatty Acids (FISH OIL) 1000 MG CAPS Take 2,000 mg by mouth daily.    Marland Kitchen PARoxetine (PAXIL) 20 MG tablet Take 1 tablet (20 mg total) by mouth daily. 90 tablet 3  . rosuvastatin (CRESTOR) 5 MG tablet Take 1 tablet (5 mg total) by mouth daily. 90 tablet 3   No current facility-administered medications on file prior to visit.   Allergies  Allergen Reactions  . Morphine And Related Nausea Only  . Statins Other (See Comments)    Effected liver enzymes  . Zetia [Ezetimibe]     Elevated LFT's   Social History   Socioeconomic History  . Marital status: Married    Spouse name: Not on file  . Number of children: Not on file  . Years of education: Not on file  . Highest education level: Not on file  Occupational History  . Not on file  Tobacco Use  . Smoking status: Never Smoker  . Smokeless tobacco: Never Used  Substance and  Sexual Activity  . Alcohol use: Yes    Alcohol/week: 4.0 standard drinks    Types: 3 Cans of beer, 1 Shots of liquor per week    Comment: occasional  . Drug use: No  . Sexual activity: Not on file    Comment: avid runner and cyclist.  Married.  Other Topics Concern  . Not on file  Social History Narrative  . Not on file   Social Determinants of Health   Financial Resource Strain:   . Difficulty of Paying Living Expenses: Not on file  Food Insecurity:   . Worried About Charity fundraiser in the Last Year: Not on file  . Ran Out of Food in the Last Year: Not on file  Transportation Needs:   . Lack of Transportation (Medical): Not on file  . Lack of Transportation (Non-Medical): Not on file  Physical Activity:   . Days of Exercise per Week: Not on file  . Minutes of Exercise per  Session: Not on file  Stress:   . Feeling of Stress : Not on file  Social Connections:   . Frequency of Communication with Friends and Family: Not on file  . Frequency of Social Gatherings with Friends and Family: Not on file  . Attends Religious Services: Not on file  . Active Member of Clubs or Organizations: Not on file  . Attends Archivist Meetings: Not on file  . Marital Status: Not on file  Intimate Partner Violence:   . Fear of Current or Ex-Partner: Not on file  . Emotionally Abused: Not on file  . Physically Abused: Not on file  . Sexually Abused: Not on file      Review of Systems  All other systems reviewed and are negative.      Objective:   Physical Exam        Assessment & Plan:  [id number] virus infection  Physical exam is unable to be performed today as the patient was seen via telephone however he is speaking full and complete sentences with no respiratory distress and he does not cough during our telephone visit.  At the present time patient symptoms are being controlled with Advil.  I encouraged him to try to drink more Gatorade to avoid dehydration and he can also alternate Tylenol with Advil to help control the fever and the body aches.  I instructed the patient that if he develops any shortness of breath he is to go to immediately to the hospital however at the present time as long as he is not experiencing shortness of breath I would recommend that he quarantine at home and allow the symptoms to pass on their own.  I would anticipate that by the end of the week he should be feeling better.  We discussed reasons to go to the emergency room and when to seek further medical attention.  Otherwise push fluids and alternate Advil and Tylenol.  Patient is comfortable with this plan.

## 2019-04-29 ENCOUNTER — Other Ambulatory Visit: Payer: Self-pay | Admitting: Family Medicine

## 2019-06-24 ENCOUNTER — Other Ambulatory Visit: Payer: Self-pay

## 2019-06-24 ENCOUNTER — Ambulatory Visit: Payer: 59 | Attending: Internal Medicine

## 2019-06-24 DIAGNOSIS — Z23 Encounter for immunization: Secondary | ICD-10-CM

## 2019-06-24 NOTE — Progress Notes (Signed)
   Covid-19 Vaccination Clinic  Name:  Darryl Ross    MRN: [id number] DOB: 1966-02-04  06/24/2019  [name] was observed post Covid-19 immunization for 15 minutes without incident. He was provided with Vaccine Information Sheet and instruction to access the V-Safe system.   [name] was instructed to call 911 with any severe reactions post vaccine: Marland Kitchen Difficulty breathing  . Swelling of face and throat  . A fast heartbeat  . A bad rash all over body  . Dizziness and weakness   Immunizations Administered    Name Date Dose VIS Date Route   Pfizer COVID-19 Vaccine 06/24/2019 10:33 AM 0.3 mL 03/17/2019 Intramuscular   Manufacturer: [place]   Lot: [id number]   [place]: [id number]

## 2019-07-18 ENCOUNTER — Ambulatory Visit: Payer: Self-pay | Attending: Internal Medicine

## 2019-07-18 DIAGNOSIS — Z23 Encounter for immunization: Secondary | ICD-10-CM

## 2019-07-18 NOTE — Progress Notes (Signed)
   Covid-19 Vaccination Clinic  Name:  RAJU ROUTH    MRN: [id number] DOB: Apr 01, 1966  07/18/2019  Mr. Chopra was observed post Covid-19 immunization for 15 minutes without incident. He was provided with Vaccine Information Sheet and instruction to access the V-Safe system.   Mr. Tasca was instructed to call 911 with any severe reactions post vaccine: Marland Kitchen Difficulty breathing  . Swelling of face and throat  . A fast heartbeat  . A bad rash all over body  . Dizziness and weakness   Immunizations Administered    Name Date Dose VIS Date Route   Pfizer COVID-19 Vaccine 07/18/2019  1:09 PM 0.3 mL 03/17/2019 Intramuscular   Manufacturer: [place]   Lot: [id number]   [place]: [id number]

## 2019-08-04 ENCOUNTER — Other Ambulatory Visit: Payer: Self-pay | Admitting: Family Medicine

## 2019-08-04 NOTE — Telephone Encounter (Signed)
Pt is requesting refill on Xanax   LOV: 03/10/2019  LRF:  11/15/2018

## 2019-10-04 ENCOUNTER — Other Ambulatory Visit: Payer: Self-pay | Admitting: Family Medicine

## 2020-01-28 ENCOUNTER — Telehealth: Payer: Self-pay | Admitting: Family Medicine

## 2020-01-28 NOTE — Telephone Encounter (Signed)
Pharmacy:[facility] [address] [employer] [facility]  Medication:PARoxetine (PAXIL) 20 MG tablet  Qty:90  NJN:GWLT 1 tablet (20 mg total) by mouth daily.

## 2020-01-29 ENCOUNTER — Other Ambulatory Visit: Payer: Self-pay

## 2020-01-29 MED ORDER — PAROXETINE HCL 20 MG PO TABS: 20.0000 mg | ORAL_TABLET | Freq: Every day | ORAL | 0 refills | Status: DC

## 2020-02-23 ENCOUNTER — Other Ambulatory Visit: Payer: Self-pay | Admitting: Family Medicine

## 2020-03-04 ENCOUNTER — Ambulatory Visit (LOCAL_COMMUNITY_HEALTH_CENTER): Payer: 59

## 2020-03-04 ENCOUNTER — Other Ambulatory Visit: Payer: Self-pay

## 2020-03-04 DIAGNOSIS — Z23 Encounter for immunization: Secondary | ICD-10-CM

## 2020-03-04 NOTE — Progress Notes (Signed)
Flu vaccine given; tolerated well Blakleigh Straw, RN  

## 2020-04-28 ENCOUNTER — Other Ambulatory Visit: Payer: Self-pay | Admitting: Family Medicine

## 2020-05-08 ENCOUNTER — Telehealth: Payer: Self-pay

## 2020-05-08 NOTE — Telephone Encounter (Signed)
Pt called b/c he has been struggling with back and neck pain for several years, states he tried PT, injections, and other therapy but would like a new referral to see Dr Durene Cal in [place] at [facility] for a new evaluation as he still does not get relief from symptoms

## 2020-05-09 ENCOUNTER — Other Ambulatory Visit: Payer: Self-pay | Admitting: Family Medicine

## 2020-05-09 DIAGNOSIS — M5030 Other cervical disc degeneration, unspecified cervical region: Secondary | ICD-10-CM

## 2020-05-09 NOTE — Telephone Encounter (Signed)
I placed referral.

## 2020-09-11 ENCOUNTER — Other Ambulatory Visit: Payer: Self-pay

## 2020-09-11 MED ORDER — PAROXETINE HCL 20 MG PO TABS
20.0000 mg | ORAL_TABLET | Freq: Every day | ORAL | 1 refills | Status: DC
Start: 2020-09-11 — End: 2021-02-20

## 2021-02-19 ENCOUNTER — Other Ambulatory Visit: Payer: Self-pay | Admitting: Family Medicine

## 2021-02-19 NOTE — Telephone Encounter (Signed)
Last routine OV 03/10/19 Last refill 09/11/20, #90, 1 refill  Pt scheduled for OV 03/25/21 Rx changed to #45 with note pt must keep appt for further refills.   Please review, thanks!

## 2021-03-20 ENCOUNTER — Other Ambulatory Visit: Payer: Self-pay

## 2021-03-20 DIAGNOSIS — E7800 Pure hypercholesterolemia, unspecified: Secondary | ICD-10-CM

## 2021-03-20 DIAGNOSIS — I10 Essential (primary) hypertension: Secondary | ICD-10-CM

## 2021-03-20 DIAGNOSIS — Z0000 Encounter for general adult medical examination without abnormal findings: Secondary | ICD-10-CM

## 2021-03-21 ENCOUNTER — Other Ambulatory Visit: Payer: Self-pay

## 2021-03-21 DIAGNOSIS — Z0000 Encounter for general adult medical examination without abnormal findings: Secondary | ICD-10-CM

## 2021-03-21 DIAGNOSIS — E7800 Pure hypercholesterolemia, unspecified: Secondary | ICD-10-CM

## 2021-03-21 DIAGNOSIS — I10 Essential (primary) hypertension: Secondary | ICD-10-CM

## 2021-03-25 ENCOUNTER — Encounter: Payer: Self-pay | Admitting: Family Medicine

## 2021-03-25 ENCOUNTER — Other Ambulatory Visit: Payer: Self-pay

## 2021-03-25 ENCOUNTER — Ambulatory Visit (INDEPENDENT_AMBULATORY_CARE_PROVIDER_SITE_OTHER): Payer: 59 | Admitting: Family Medicine

## 2021-03-25 VITALS — BP 138/98 | HR 73 | Resp 18 | Ht 72.0 in | Wt 215.0 lb

## 2021-03-25 DIAGNOSIS — Z125 Encounter for screening for malignant neoplasm of prostate: Secondary | ICD-10-CM | POA: Diagnosis not present

## 2021-03-25 DIAGNOSIS — K719 Toxic liver disease, unspecified: Secondary | ICD-10-CM

## 2021-03-25 DIAGNOSIS — E7800 Pure hypercholesterolemia, unspecified: Secondary | ICD-10-CM | POA: Diagnosis not present

## 2021-03-25 DIAGNOSIS — Z0000 Encounter for general adult medical examination without abnormal findings: Secondary | ICD-10-CM | POA: Diagnosis not present

## 2021-03-25 DIAGNOSIS — Z9189 Other specified personal risk factors, not elsewhere classified: Secondary | ICD-10-CM | POA: Diagnosis not present

## 2021-03-25 DIAGNOSIS — T466X5A Adverse effect of antihyperlipidemic and antiarteriosclerotic drugs, initial encounter: Secondary | ICD-10-CM

## 2021-03-25 NOTE — Progress Notes (Signed)
Subjective:    Patient ID: Darryl Ross, male    DOB: 1965-10-01, 55 y.o.   MRN: [id number]  HPI Patient is here for complete physical exam.  Patient had a colonoscopy in 2016 which showed microscopic colitis but was otherwise normal.  He is due for repeat colonoscopy in 2026.  He is due for prostate cancer screening.  PSA was not on his recent lab work however we can have that biopsy performed on Friday.  He denies any lower urinary tract symptoms.  He is due for the most recent COVID booster along with Shingrix.  His tetanus shot and flu shot are up-to-date.  His most recent labs are listed below Immunization History  Administered Date(s) Administered   DTaP 03/06/2009   Influenza, Quadrivalent, Recombinant, Inj, Pf 03/11/2021   Influenza, Seasonal, Injecte, Preservative Fre 03/04/2020   Influenza-Unspecified 02/03/2016, 01/23/2017, 12/31/2017, 01/04/2019   PFIZER(Purple Top)SARS-COV-2 Vaccination 06/24/2019, 07/18/2019   Tdap 11/04/2013    Appointment on 03/21/2021  Component Date Value Ref Range Status   WBC 03/21/2021 8.1  3.8 - 10.8 Thousand/uL Final   RBC 03/21/2021 5.21  4.20 - 5.80 Million/uL Final   Hemoglobin 03/21/2021 16.4  13.2 - 17.1 g/dL Final   HCT 03/21/2021 48.9  38.5 - 50.0 % Final   MCV 03/21/2021 93.9  80.0 - 100.0 fL Final   MCH 03/21/2021 31.5  27.0 - 33.0 pg Final   MCHC 03/21/2021 33.5  32.0 - 36.0 g/dL Final   RDW 03/21/2021 12.5  11.0 - 15.0 % Final   Platelets 03/21/2021 287  140 - 400 Thousand/uL Final   MPV 03/21/2021 10.5  7.5 - 12.5 fL Final   Neutro Abs 03/21/2021 5,338  1,500 - 7,800 cells/uL Final   Lymphs Abs 03/21/2021 1,571  850 - 3,900 cells/uL Final   Absolute Monocytes 03/21/2021 713  200 - 950 cells/uL Final   Eosinophils Absolute 03/21/2021 437  15 - 500 cells/uL Final   Basophils Absolute 03/21/2021 41  0 - 200 cells/uL Final   Neutrophils Relative % 03/21/2021 65.9  % Final   Total Lymphocyte 03/21/2021 19.4  % Final   Monocytes  Relative 03/21/2021 8.8  % Final   Eosinophils Relative 03/21/2021 5.4  % Final   Basophils Relative 03/21/2021 0.5  % Final   Glucose, Bld 03/21/2021 101 (H)  65 - 99 mg/dL Final   Comment: .            Fasting reference interval . For someone without known diabetes, a glucose value between 100 and 125 mg/dL is consistent with prediabetes and should be confirmed with a follow-up test. .    BUN 03/21/2021 13  7 - 25 mg/dL Final   Creat 03/21/2021 1.10  0.70 - 1.30 mg/dL Final   BUN/Creatinine Ratio 31/49/7026 NOT APPLICABLE  6 - 22 (calc) Final   Sodium 03/21/2021 141  135 - 146 mmol/L Final   Potassium 03/21/2021 4.5  3.5 - 5.3 mmol/L Final   Chloride 03/21/2021 103  98 - 110 mmol/L Final   CO2 03/21/2021 30  20 - 32 mmol/L Final   Calcium 03/21/2021 9.1  8.6 - 10.3 mg/dL Final   Total Protein 03/21/2021 6.5  6.1 - 8.1 g/dL Final   Albumin 03/21/2021 4.5  3.6 - 5.1 g/dL Final   Globulin 03/21/2021 2.0  1.9 - 3.7 g/dL (calc) Final   AG Ratio 03/21/2021 2.3  1.0 - 2.5 (calc) Final   Total Bilirubin 03/21/2021 0.7  0.2 - 1.2 mg/dL  Final   Alkaline phosphatase (APISO) 03/21/2021 46  35 - 144 U/L Final   AST 03/21/2021 22  10 - 35 U/L Final   ALT 03/21/2021 34  9 - 46 U/L Final   Cholesterol 03/21/2021 250 (H)  <200 mg/dL Final   HDL 03/21/2021 56  > OR = 40 mg/dL Final   Triglycerides 03/21/2021 178 (H)  <150 mg/dL Final   LDL Cholesterol (Calc) 03/21/2021 161 (H)  mg/dL (calc) Final   Comment: Reference range: <100 . Desirable range <100 mg/dL for primary prevention;   <70 mg/dL for patients with CHD or diabetic patients  with > or = 2 CHD risk factors. Marland Kitchen LDL-C is now calculated using the Martin-Hopkins  calculation, which is a validated novel method providing  better accuracy than the Friedewald equation in the  estimation of LDL-C.  Cresenciano Genre et al. Annamaria Helling. 7408;144(81): 2061-2068  (http://education.[facility].com/faq/FAQ164)    Total CHOL/HDL Ratio 03/21/2021 4.5   <5.0 (calc) Final   Non-HDL Cholesterol (Calc) 03/21/2021 194 (H)  <130 mg/dL (calc) Final   Comment: For patients with diabetes plus 1 major ASCVD risk  factor, treating to a non-HDL-C goal of <100 mg/dL  (LDL-C of <70 mg/dL) is considered a therapeutic  option.     Past Medical History:  Diagnosis Date   Anxiety    Arthritis    hands, neck   Degenerative disc disease, cervical    no limitations   Hypercholesteremia    Hyperlipidemia    Hypertension    neg stress test several yrs ago   Lymphocytic colitis    Past Surgical History:  Procedure Laterality Date   COLONOSCOPY WITH PROPOFOL N/A 03/04/2015   Procedure: COLONOSCOPY WITH PROPOFOL;  Surgeon: Lucilla Lame, MD;  Location: [facility];  Service: Endoscopy;  Laterality: N/A;   HAND SURGERY     fracture repair   Current Outpatient Medications on File Prior to Visit  Medication Sig Dispense Refill   ALPRAZolam (XANAX) 0.5 MG tablet TAKE 1 TABLET (0.5 MG TOTAL) BY MOUTH AT BEDTIME AS NEEDED. 30 tablet 0   aspirin 81 MG tablet Take 81 mg by mouth daily.     budesonide (ENTOCORT EC) 3 MG 24 hr capsule TAKE 3 CAPSULES (9 MG TOTAL) BY MOUTH EVERY MORNING. (Patient taking differently: Take 9 mg by mouth as needed.) 90 capsule 1   Glucosamine-Chondroitin (OSTEO BI-FLEX REGULAR STRENGTH PO) Take 2 tablets by mouth daily.     metoprolol succinate (TOPROL-XL) 25 MG 24 hr tablet TAKE 1 TABLET BY MOUTH EVERY DAY 90 tablet 3   Multiple Vitamin (MULTIVITAMIN) tablet Take 1 tablet by mouth daily.     Omega-3 Fatty Acids (FISH OIL) 1000 MG CAPS Take 2,000 mg by mouth daily.     PARoxetine (PAXIL) 20 MG tablet TAKE 1 TABLET BY MOUTH EVERY DAY 45 tablet 0   rosuvastatin (CRESTOR) 5 MG tablet TAKE 1 TABLET BY MOUTH EVERY DAY 90 tablet 1   No current facility-administered medications on file prior to visit.   Allergies  Allergen Reactions   Morphine And Related Nausea Only   Statins Other (See Comments)    Effected liver enzymes    Zetia [Ezetimibe]     Elevated LFT's   Social History   Socioeconomic History   Marital status: Married    Spouse name: Not on file   Number of children: Not on file   Years of education: Not on file   Highest education level: Not on file  Occupational History  Not on file  Tobacco Use   Smoking status: Never   Smokeless tobacco: Never  Substance and Sexual Activity   Alcohol use: Yes    Alcohol/week: 4.0 standard drinks    Types: 3 Cans of beer, 1 Shots of liquor per week    Comment: occasional   Drug use: No   Sexual activity: Not on file    Comment: avid runner and cyclist.  Married.  Other Topics Concern   Not on file  Social History Narrative   Not on file   Social Determinants of Health   Financial Resource Strain: Not on file  Food Insecurity: Not on file  Transportation Needs: Not on file  Physical Activity: Not on file  Stress: Not on file  Social Connections: Not on file  Intimate Partner Violence: Not on file   Family History  Problem Relation Age of Onset   Hyperlipidemia Mother    Arrhythmia Father        A-Fib    Hypertension Father       Review of Systems  All other systems reviewed and are negative.     Objective:   Physical Exam Vitals reviewed.  Constitutional:      General: He is not in acute distress.    Appearance: He is well-developed. He is not diaphoretic.  HENT:     Head: Normocephalic and atraumatic.     Right Ear: External ear normal.     Left Ear: External ear normal.     Nose: Nose normal.     Mouth/Throat:     Pharynx: No oropharyngeal exudate.  Eyes:     General: No scleral icterus.       Right eye: No discharge.        Left eye: No discharge.     Conjunctiva/sclera: Conjunctivae normal.     Pupils: Pupils are equal, round, and reactive to light.  Neck:     Thyroid: No thyromegaly.     Vascular: No JVD.     Trachea: No tracheal deviation.  Cardiovascular:     Rate and Rhythm: Normal rate and regular  rhythm.     Heart sounds: Normal heart sounds. No murmur heard.   No friction rub. No gallop.  Pulmonary:     Effort: Pulmonary effort is normal. No respiratory distress.     Breath sounds: Normal breath sounds. No stridor. No wheezing or rales.  Chest:     Chest wall: No tenderness.  Abdominal:     General: Bowel sounds are normal. There is no distension.     Palpations: Abdomen is soft. There is no mass.     Tenderness: There is no abdominal tenderness. There is no guarding or rebound.  Musculoskeletal:        General: No tenderness. Normal range of motion.     Cervical back: Normal range of motion and neck supple.  Lymphadenopathy:     Cervical: No cervical adenopathy.  Skin:    General: Skin is warm.     Coloration: Skin is not pale.     Findings: No erythema or rash.  Neurological:     Mental Status: He is alert and oriented to person, place, and time.     Cranial Nerves: No cranial nerve deficit.     Motor: No abnormal muscle tone.     Coordination: Coordination normal.     Deep Tendon Reflexes: Reflexes are normal and symmetric.  Psychiatric:        Behavior: Behavior normal.  Thought Content: Thought content normal.        Judgment: Judgment normal.          Assessment & Plan:  Prostate cancer screening - Plan: PSA  10 year risk of MI or stroke 7.5% or greater - Plan: CT CARDIAC SCORING (SELF PAY ONLY)  Pure hypercholesterolemia - Plan: CT CARDIAC SCORING (SELF PAY ONLY)  Hepatotoxicity due to statin drug  General medical exam Based on his blood pressure, I calculate his 10-year risk of cardiovascular disease to be 10.3.  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  We discussed coronary artery calcium scoring to determine the patient's cardiovascular risk.  He is extremely athletic and runs and exercises on a daily basis.  He denies any family history of  coronary artery disease and believes that his likelihood of blockage is very low who is very interested in seeing what his coronary calcium score is, and whether he needs wrist higher dose of statin including hepatic toxicity.  Therefore we will try to schedule the patient for cardiac CT scoring.  The remainder of his physical exam is excellent.  He is monitoring his blood pressure at home and it is never been this high today.  He believes some of that could be due to Advil that he is taking recently and some due to anxiety.  I recommended the shingles vaccine and a COVID booster.  His colonoscopy is up-to-date.  I will add a PSA to his lab work

## 2021-03-27 LAB — CBC WITH DIFFERENTIAL/PLATELET
Absolute Monocytes: 713 cells/uL (ref 200–950)
Basophils Absolute: 41 cells/uL (ref 0–200)
Basophils Relative: 0.5 %
Eosinophils Absolute: 437 cells/uL (ref 15–500)
Eosinophils Relative: 5.4 %
HCT: 48.9 % (ref 38.5–50.0)
Hemoglobin: 16.4 g/dL (ref 13.2–17.1)
Lymphs Abs: 1571 cells/uL (ref 850–3900)
MCH: 31.5 pg (ref 27.0–33.0)
MCHC: 33.5 g/dL (ref 32.0–36.0)
MCV: 93.9 fL (ref 80.0–100.0)
MPV: 10.5 fL (ref 7.5–12.5)
Monocytes Relative: 8.8 %
Neutro Abs: 5338 cells/uL (ref 1500–7800)
Neutrophils Relative %: 65.9 %
Platelets: 287 10*3/uL (ref 140–400)
RBC: 5.21 10*6/uL (ref 4.20–5.80)
RDW: 12.5 % (ref 11.0–15.0)
Total Lymphocyte: 19.4 %
WBC: 8.1 10*3/uL (ref 3.8–10.8)

## 2021-03-27 LAB — LIPID PANEL
Cholesterol: 250 mg/dL — ABNORMAL HIGH (ref <200)
HDL: 56 mg/dL (ref >=40)
LDL Cholesterol (Calc): 161 mg/dL (calc) — ABNORMAL HIGH
Non-HDL Cholesterol (Calc): 194 mg/dL (calc) — ABNORMAL HIGH (ref <130)
Total CHOL/HDL Ratio: 4.5 (calc) (ref <5.0)
Triglycerides: 178 mg/dL — ABNORMAL HIGH (ref <150)

## 2021-03-27 LAB — COMPREHENSIVE METABOLIC PANEL
AG Ratio: 2.3 (calc) (ref 1.0–2.5)
ALT: 34 U/L (ref 9–46)
AST: 22 U/L (ref 10–35)
Albumin: 4.5 g/dL (ref 3.6–5.1)
Alkaline phosphatase (APISO): 46 U/L (ref 35–144)
BUN: 13 mg/dL (ref 7–25)
CO2: 30 mmol/L (ref 20–32)
Calcium: 9.1 mg/dL (ref 8.6–10.3)
Chloride: 103 mmol/L (ref 98–110)
Creat: 1.1 mg/dL (ref 0.70–1.30)
Globulin: 2 g/dL (calc) (ref 1.9–3.7)
Glucose, Bld: 101 mg/dL — ABNORMAL HIGH (ref 65–99)
Potassium: 4.5 mmol/L (ref 3.5–5.3)
Sodium: 141 mmol/L (ref 135–146)
Total Bilirubin: 0.7 mg/dL (ref 0.2–1.2)
Total Protein: 6.5 g/dL (ref 6.1–8.1)

## 2021-03-27 LAB — PSA: PSA: 0.29 ng/mL (ref <=4.00)

## 2021-03-30 ENCOUNTER — Other Ambulatory Visit: Payer: Self-pay | Admitting: Family Medicine

## 2021-04-01 ENCOUNTER — Ambulatory Visit (HOSPITAL_COMMUNITY)
Admission: RE | Admit: 2021-04-01 | Discharge: 2021-04-01 | Disposition: A | Discharge status: Home / Self Care | Payer: Self-pay | Source: Ambulatory Visit | Attending: Family Medicine | Admitting: Family Medicine

## 2021-04-01 ENCOUNTER — Other Ambulatory Visit: Payer: Self-pay

## 2021-04-01 DIAGNOSIS — Z9189 Other specified personal risk factors, not elsewhere classified: Secondary | ICD-10-CM | POA: Insufficient documentation

## 2021-04-01 DIAGNOSIS — E7800 Pure hypercholesterolemia, unspecified: Secondary | ICD-10-CM | POA: Insufficient documentation

## 2021-04-03 ENCOUNTER — Telehealth: Payer: Self-pay

## 2021-04-03 NOTE — Telephone Encounter (Signed)
Informed patient of results and recommendations. 

## 2021-04-03 NOTE — Telephone Encounter (Signed)
-----   Message from Susy Frizzle, MD sent at 04/03/2021  6:57 AM EST ----- Coronary calcium score was elevated.  The majority of this was seen in the left anterior descending coronary artery.  That is the "widow maker" artery.  His score puts him around the 70th percentile compared to other people his age.  Therefore I feel that it is important to continue him on statin and try to get his LDL cholesterol below 70 if possible.

## 2021-04-29 ENCOUNTER — Other Ambulatory Visit: Payer: Self-pay | Admitting: Family Medicine

## 2021-06-28 ENCOUNTER — Other Ambulatory Visit: Payer: Self-pay | Admitting: Family Medicine

## 2021-07-06 ENCOUNTER — Encounter: Payer: Self-pay | Admitting: Family Medicine

## 2021-07-07 ENCOUNTER — Other Ambulatory Visit: Payer: Self-pay | Admitting: Family Medicine

## 2021-07-07 MED ORDER — VALSARTAN 160 MG PO TABS: 160.0000 mg | ORAL_TABLET | Freq: Every day | ORAL | 3 refills | Status: DC

## 2021-07-08 ENCOUNTER — Telehealth: Payer: Self-pay

## 2021-07-08 NOTE — Telephone Encounter (Signed)
Per pt took b/p meds around 4pm today and it low 96/68 and feels light-headed. Wants to know if he should break in half? ? ? ?Pls advise ? ?

## 2021-07-10 NOTE — Telephone Encounter (Signed)
Pt alware 07/10/21 ?

## 2021-07-10 NOTE — Telephone Encounter (Signed)
Attempted to call pt, LM ?

## 2021-07-23 ENCOUNTER — Emergency Department: Payer: No Typology Code available for payment source

## 2021-07-23 ENCOUNTER — Other Ambulatory Visit: Payer: Self-pay

## 2021-07-23 ENCOUNTER — Emergency Department
Admission: EM | Admit: 2021-07-23 | Discharge: 2021-07-23 | Disposition: A | Discharge status: Home / Self Care | Payer: No Typology Code available for payment source | Attending: Emergency Medicine | Admitting: Emergency Medicine

## 2021-07-23 DIAGNOSIS — I639 Cerebral infarction, unspecified: Secondary | ICD-10-CM | POA: Insufficient documentation

## 2021-07-23 DIAGNOSIS — R42 Dizziness and giddiness: Secondary | ICD-10-CM | POA: Diagnosis not present

## 2021-07-23 DIAGNOSIS — I10 Essential (primary) hypertension: Secondary | ICD-10-CM | POA: Insufficient documentation

## 2021-07-23 DIAGNOSIS — R519 Headache, unspecified: Secondary | ICD-10-CM | POA: Diagnosis present

## 2021-07-23 DIAGNOSIS — G43109 Migraine with aura, not intractable, without status migrainosus: Secondary | ICD-10-CM | POA: Insufficient documentation

## 2021-07-23 LAB — COMPREHENSIVE METABOLIC PANEL
ALT: 34 U/L (ref 0–44)
AST: 28 U/L (ref 15–41)
Albumin: 4.9 g/dL (ref 3.5–5.0)
Alkaline Phosphatase: 43 U/L (ref 38–126)
Anion gap: 9 (ref 5–15)
BUN: 16 mg/dL (ref 6–20)
CO2: 26 mmol/L (ref 22–32)
Calcium: 9.7 mg/dL (ref 8.9–10.3)
Chloride: 104 mmol/L (ref 98–111)
Creatinine, Ser: 1.12 mg/dL (ref 0.61–1.24)
GFR, Estimated: >60 mL/min (ref >60)
Glucose, Bld: 143 mg/dL — ABNORMAL HIGH (ref 70–99)
Potassium: 4.9 mmol/L (ref 3.5–5.1)
Sodium: 139 mmol/L (ref 135–145)
Total Bilirubin: 1.2 mg/dL (ref 0.3–1.2)
Total Protein: 7.5 g/dL (ref 6.5–8.1)

## 2021-07-23 LAB — TROPONIN I (HIGH SENSITIVITY)
Troponin I (High Sensitivity): 4 ng/L (ref <18)
Troponin I (High Sensitivity): 5 ng/L (ref <18)

## 2021-07-23 LAB — CBC WITH DIFFERENTIAL/PLATELET
Abs Immature Granulocytes: 0.05 10*3/uL (ref 0.00–0.07)
Basophils Absolute: 0.1 10*3/uL (ref 0.0–0.1)
Basophils Relative: 0 %
Eosinophils Absolute: 0.1 10*3/uL (ref 0.0–0.5)
Eosinophils Relative: 1 %
HCT: 47.1 % (ref 39.0–52.0)
Hemoglobin: 15.3 g/dL (ref 13.0–17.0)
Immature Granulocytes: 0 %
Lymphocytes Relative: 7 %
Lymphs Abs: 0.9 10*3/uL (ref 0.7–4.0)
MCH: 30.7 pg (ref 26.0–34.0)
MCHC: 32.5 g/dL (ref 30.0–36.0)
MCV: 94.4 fL (ref 80.0–100.0)
Monocytes Absolute: 0.4 10*3/uL (ref 0.1–1.0)
Monocytes Relative: 3 %
Neutro Abs: 10.8 10*3/uL — ABNORMAL HIGH (ref 1.7–7.7)
Neutrophils Relative %: 89 %
Platelets: 280 10*3/uL (ref 150–400)
RBC: 4.99 MIL/uL (ref 4.22–5.81)
RDW: 12.5 % (ref 11.5–15.5)
WBC: 12.3 10*3/uL — ABNORMAL HIGH (ref 4.0–10.5)
nRBC: 0.2 % (ref 0.0–0.2)

## 2021-07-23 LAB — APTT: aPTT: 29 seconds (ref 24–36)

## 2021-07-23 LAB — PROTIME-INR
INR: 1 (ref 0.8–1.2)
Prothrombin Time: 12.5 seconds (ref 11.4–15.2)

## 2021-07-23 LAB — CBG MONITORING, ED: Glucose-Capillary: 87 mg/dL (ref 70–99)

## 2021-07-23 MED ORDER — BUTALBITAL-APAP-CAFFEINE 50-325-40 MG PO TABS
2.0000 | ORAL_TABLET | Freq: Once | ORAL | Status: AC
  Administered 2021-07-23: 2 via ORAL
  Filled 2021-07-23: qty 2

## 2021-07-23 MED ORDER — KETOROLAC TROMETHAMINE 30 MG/ML IJ SOLN
30.0000 mg | Freq: Once | INTRAMUSCULAR | Status: AC
  Administered 2021-07-23: 30 mg via INTRAMUSCULAR
  Filled 2021-07-23: qty 1

## 2021-07-23 NOTE — Discharge Instructions (Addendum)
Use Tylenol for pain and fevers.  Up to 1000 mg per dose, up to 4 times per day.  Do not take more than 4000 mg of Tylenol/acetaminophen within 24 hours.. ? ?Use naproxen/Aleve for anti-inflammatory pain relief. Use up to '500mg'$  every 12 hours. Do not take more frequently than this. Do not use other NSAIDs (ibuprofen, Advil) while taking this medication. It is safe to take Tylenol with this.  ? ?Follow-up with your regular doctor when you are able to for a recheck. ? ?If you develop any worsening symptoms such as strokelike symptoms, headaches that will not go away, headaches with fevers or passing out, please return to the ED. ? ? ?

## 2021-07-23 NOTE — ED Notes (Signed)
Pt taken to MRI  

## 2021-07-23 NOTE — ED Notes (Signed)
Pt back from MRI 

## 2021-07-23 NOTE — ED Triage Notes (Signed)
Pt here with a headache and shaking. Pt denies numbness, equal grip strength, and no slurred speech.  ?

## 2021-07-23 NOTE — ED Notes (Signed)
First Nurse Note:  Pt to ED for headache. Pt wife states that pt has been having headaches for the past few weeks but were worse today. Pt states that the was out cycling when he got a bad headache and nausea and had to have his friend bring him home. Pt wife concerned pt is having a stroke. Pt denies visual changes, pt does not have slurred speech or facial droop. Pt has equal grip strength bilaterally.  ?

## 2021-07-23 NOTE — ED Provider Notes (Signed)
? ?[facility] ?Provider Note ? ? ? Event Date/Time  ? First MD Initiated Contact with Patient 07/23/21 1610   ?  (approximate) ? ? ?History  ? ?Headache ? ? ?HPI ? ?Darryl Ross is a 56 y.o. male who presents to the ED for evaluation of Headache ?  ?I reviewed PCP visit from 12/20.  History of HTN, HLD, anxiety.  ? ?Patient presents to the ED, accompanied by his wife, for evaluation of the second episode of a novel headache in the past 2 weeks.  Reports no history of migrainous headaches, sometimes getting occipital tension headaches, but nothing like this. ? ?Patient reports an acute headache that occurred a few hours ago this afternoon while he was out biking with a friend.  Reports rapid onset of headache behind his right eye, nausea, dizziness, flushed sensation and halos and lights in his visual fields diffusely. ? ?Denies falling off of his bike or weakness to the extremities.  Denies changes to his vision such as a visual field cut or focal blurriness.  Reports a continued headache, but improvement of his nausea without abdominal pain or emesis. ? ?Wife expresses explicit concern for stroke because patient's father had a stroke behind his right eye. ? ?Physical Exam  ? ?Triage Vital Signs: ?ED Triage Vitals  ?Enc Vitals Group  ?   BP 07/23/21 1503 (!) 151/81  ?   Pulse Rate 07/23/21 1503 60  ?   Resp 07/23/21 1503 20  ?   Temp 07/23/21 1516 97.6 ?F (36.4 ?C)  ?   Temp Source 07/23/21 1516 Oral  ?   SpO2 07/23/21 1503 100 %  ?   Weight 07/23/21 1509 210 lb (95.3 kg)  ?   Height 07/23/21 1509 6' (1.829 m)  ?   Head Circumference --   ?   Peak Flow --   ?   Pain Score 07/23/21 1504 5  ?   Pain Loc --   ?   Pain Edu? --   ?   Excl. in [place]? --   ? ? ?Most recent vital signs: ?Vitals:  ? 07/23/21 1516 07/23/21 1626  ?BP:  137/88  ?Pulse:  (!) 51  ?Resp:  20  ?Temp: 97.6 ?F (36.4 ?C)   ?SpO2:  100%  ? ? ?General: Awake, no distress.  Ambulatory to his hallway bed.  Pleasant and  conversational. ?CV:  Good peripheral perfusion.  ?Resp:  Normal effort.  ?Abd:  No distention.  ?MSK:  No deformity noted.  ?Neuro:  No focal deficits appreciated. Cranial nerves II through XII intact ?5/5 strength and sensation in all 4 extremities ?Other:   ? ? ?ED Results / Procedures / Treatments  ? ?Labs ?(all labs ordered are listed, but only abnormal results are displayed) ?Labs Reviewed  ?COMPREHENSIVE METABOLIC PANEL - Abnormal; Notable for the following components:  ?    Result Value  ? Glucose, Bld 143 (*)   ? All other components within normal limits  ?CBC WITH DIFFERENTIAL/PLATELET - Abnormal; Notable for the following components:  ? WBC 12.3 (*)   ? Neutro Abs 10.8 (*)   ? All other components within normal limits  ?PROTIME-INR  ?APTT  ?CBG MONITORING, ED  ?TROPONIN I (HIGH SENSITIVITY)  ?TROPONIN I (HIGH SENSITIVITY)  ? ? ?EKG ? ? ?RADIOLOGY ?CT head reviewed by me without evidence of acute intracranial pathology ? ?Official radiology report(s): ?CT HEAD WO CONTRAST (5MM) ? ?Result Date: 07/23/2021 ?CLINICAL DATA:  Headache. EXAM: CT  HEAD WITHOUT CONTRAST TECHNIQUE: Contiguous axial images were obtained from the base of the skull through the vertex without intravenous contrast. RADIATION DOSE REDUCTION: This exam was performed according to the departmental dose-optimization program which includes automated exposure control, adjustment of the mA and/or kV according to patient size and/or use of iterative reconstruction technique. COMPARISON:  None. FINDINGS: Brain: There is no evidence of an acute infarct, intracranial hemorrhage, mass, midline shift, or extra-axial fluid collection. There is mild cerebral atrophy. Vascular: No hyperdense vessel. Skull: No fracture or suspicious osseous lesion. Sinuses/Orbits: Visualized paranasal sinuses and mastoid air cells are clear. Unremarkable orbits. Other: None. IMPRESSION: No evidence of acute intracranial abnormality. Electronically Signed   By: Logan Bores  M.D.   On: 07/23/2021 15:43  ? ?MR BRAIN WO CONTRAST ? ?Result Date: 07/23/2021 ?CLINICAL DATA:  Headaches, vision changes EXAM: MRI HEAD WITHOUT CONTRAST TECHNIQUE: Multiplanar, multiecho pulse sequences of the brain and surrounding structures were obtained without intravenous contrast. COMPARISON:  No prior MRI, correlation is made with CT head 07/23/2021 FINDINGS: Brain: No restricted diffusion to suggest acute or subacute infarct. No acute hemorrhage, mass, mass effect, or midline shift. No foci of hemosiderin deposition to suggest remote hemorrhage. No hydrocephalus or extra-axial collection. Vascular: Normal flow voids. Skull and upper cervical spine: Normal marrow signal. Sinuses/Orbits: Clear paranasal sinuses. The orbits are unremarkable. Other: Trace fluid in the mastoid air cells. IMPRESSION: No acute intracranial process. No etiology is seen for the patient's headaches or vision changes. Electronically Signed   By: Merilyn Baba M.D.   On: 07/23/2021 18:02   ? ?PROCEDURES and INTERVENTIONS: ? ?Procedures ? ?Medications  ?butalbital-acetaminophen-caffeine (FIORICET) 50-325-40 MG per tablet 2 tablet (2 tablets Oral Given 07/23/21 1654)  ?ketorolac (TORADOL) 30 MG/ML injection 30 mg (30 mg Intramuscular Given 07/23/21 1654)  ? ? ? ?IMPRESSION / MDM / ASSESSMENT AND PLAN / ED COURSE  ?I reviewed the triage vital signs and the nursing notes. ? ?Fairly healthy 56 year old male presents to the ED with a headache that sounds migrainous in nature and ultimately suitable for outpatient management after a benign work-up.  He has reassuring vitals and examination.  He look systemically well and has a normal neurologic examination without evidence of deficits, trauma.  He is ambulatory with normal gait and has a mild headache here in the ED, resolved with Toradol and Fioricet.  Work-up is fairly benign overall.  Blood work with a minimal leukocytosis that is nonspecific.  Normal metabolic panel and troponins.  CT scan  of his head without evidence of ICH or mass, and follow-up MRI without evidence of CVA or more subtle mass.  Ultimately his symptoms are resolving and likely consistent with a migrainous headache.  He has good outpatient follow-up with his PCP and we discussed management at home and close return precautions for the ED. ? ?Clinical Course as of 07/23/21 1821  ?Wed Jul 23, 2021  ?1649 Plan of care with patient and wife.  Shared decision making we will plan to pursue migrainous treatment medications and MRI brain considering the novel nature of his headaches.  They are agreeable. [DS]  ?[place].  Feeling better.  We discussed reassuring MRI.  We discussed following up with his PCP closely and return precautions for the ED. [DS]  ?  ?Clinical Course User Index ?[DS] Vladimir Crofts, MD  ? ? ? ?FINAL CLINICAL IMPRESSION(S) / ED DIAGNOSES  ? ?Final diagnoses:  ?Migraine with aura and without status migrainosus, not intractable  ? ? ? ?  Rx / DC Orders  ? ?ED Discharge Orders   ? ? None  ? ?  ? ? ? ?Note:  This document was prepared using Dragon voice recognition software and may include unintentional dictation errors. ?  ?Vladimir Crofts, MD ?07/23/21 1822 ? ?

## 2021-07-25 ENCOUNTER — Ambulatory Visit: Payer: 59 | Admitting: Family Medicine

## 2021-07-25 VITALS — BP 120/80 | HR 45 | Temp 97.5°F | Ht 73.0 in | Wt 213.6 lb

## 2021-07-25 DIAGNOSIS — G43109 Migraine with aura, not intractable, without status migrainosus: Secondary | ICD-10-CM

## 2021-07-25 MED ORDER — VALSARTAN 80 MG PO TABS: 80.0000 mg | ORAL_TABLET | Freq: Every day | ORAL | 3 refills | Status: DC

## 2021-07-25 MED ORDER — ALPRAZOLAM 0.5 MG PO TABS: 0.5000 mg | ORAL_TABLET | Freq: Every evening | ORAL | 0 refills | Status: DC | PRN

## 2021-07-25 MED ORDER — RIZATRIPTAN BENZOATE 10 MG PO TABS: 10.0000 mg | ORAL_TABLET | ORAL | 0 refills | Status: DC | PRN

## 2021-07-25 NOTE — Progress Notes (Signed)
? ?Subjective:  ? ? Patient ID: Darryl Ross, male    DOB: 1965-07-29, 56 y.o.   MRN: [id number] ? ?HPI ?I have copied the ER MD's note from 4/19 below for reference: ? ?Fairly healthy 56 year old male presents to the ED with a headache that sounds migrainous in nature and ultimately suitable for outpatient management after a benign work-up.  He has reassuring vitals and examination.  He look systemically well and has a normal neurologic examination without evidence of deficits, trauma.  He is ambulatory with normal gait and has a mild headache here in the ED, resolved with Toradol and Fioricet.  Work-up is fairly benign overall.  Blood work with a minimal leukocytosis that is nonspecific.  Normal metabolic panel and troponins.  CT scan of his head without evidence of ICH or mass, and follow-up MRI without evidence of CVA or more subtle mass.  Ultimately his symptoms are resolving and likely consistent with a migrainous headache.  He has good outpatient follow-up with his PCP and we discussed management at home and close return precautions for the ED. ? ? ? ?Here today for follow up.  Patient has never had migraines however her migraines are strong and is family.  His mother has migraines and his brother has severe migraines.  He describes the 2 headaches that he had as retro-ocular.  They were preceded by shimmering lights.  They were then intense pain behind his eyes.  One was located on the left side and 1 was located on the right side.  They were pulsatile and associated with nausea and photosensitivity.  He has not had a headache since his most recent episode ? ?Past Medical History:  ?Diagnosis Date  ? Anxiety   ? Arthritis   ? hands, neck  ? Degenerative disc disease, cervical   ? no limitations  ? Hypercholesteremia   ? Hyperlipidemia   ? Hypertension   ? neg stress test several yrs ago  ? Lymphocytic colitis   ? ?Past Surgical History:  ?Procedure Laterality Date  ? COLONOSCOPY WITH PROPOFOL N/A  03/04/2015  ? Procedure: COLONOSCOPY WITH PROPOFOL;  Surgeon: Lucilla Lame, MD;  Location: [facility];  Service: Endoscopy;  Laterality: N/A;  ? HAND SURGERY    ? fracture repair  ? ?Current Outpatient Medications on File Prior to Visit  ?Medication Sig Dispense Refill  ? ALPRAZolam (XANAX) 0.5 MG tablet TAKE 1 TABLET (0.5 MG TOTAL) BY MOUTH AT BEDTIME AS NEEDED. 30 tablet 0  ? aspirin 81 MG tablet Take 81 mg by mouth daily.    ? budesonide (ENTOCORT EC) 3 MG 24 hr capsule TAKE 3 CAPSULES (9 MG TOTAL) BY MOUTH EVERY MORNING. (Patient taking differently: Take 9 mg by mouth as needed.) 90 capsule 1  ? Glucosamine-Chondroitin (OSTEO BI-FLEX REGULAR STRENGTH PO) Take 2 tablets by mouth daily.    ? metoprolol succinate (TOPROL-XL) 25 MG 24 hr tablet TAKE 1 TABLET BY MOUTH EVERY DAY 90 tablet 3  ? Multiple Vitamin (MULTIVITAMIN) tablet Take 1 tablet by mouth daily.    ? Omega-3 Fatty Acids (FISH OIL) 1000 MG CAPS Take 2,000 mg by mouth daily.    ? PARoxetine (PAXIL) 20 MG tablet TAKE 1 TABLET BY MOUTH EVERY DAY 90 tablet 1  ? rosuvastatin (CRESTOR) 5 MG tablet TAKE 1 TABLET BY MOUTH EVERY DAY 90 tablet 1  ? valsartan (DIOVAN) 160 MG tablet Take 1 tablet (160 mg total) by mouth daily. 90 tablet 3  ? ?No current facility-administered medications  on file prior to visit.  ? ?Allergies  ?Allergen Reactions  ? Morphine And Related Nausea Only  ? Statins Other (See Comments)  ?  Effected liver enzymes  ? Zetia [Ezetimibe]   ?  Elevated LFT's  ? ?Social History  ? ?Socioeconomic History  ? Marital status: Married  ?  Spouse name: Not on file  ? Number of children: Not on file  ? Years of education: Not on file  ? Highest education level: Not on file  ?Occupational History  ? Not on file  ?Tobacco Use  ? Smoking status: Never  ? Smokeless tobacco: Never  ?Substance and Sexual Activity  ? Alcohol use: Yes  ?  Alcohol/week: 4.0 standard drinks  ?  Types: 3 Cans of beer, 1 Shots of liquor per week  ?  Comment: occasional  ?  Drug use: No  ? Sexual activity: Not on file  ?  Comment: avid runner and cyclist.  Married.  ?Other Topics Concern  ? Not on file  ?Social History Narrative  ? Not on file  ? ?Social Determinants of Health  ? ?Financial Resource Strain: Not on file  ?Food Insecurity: Not on file  ?Transportation Needs: Not on file  ?Physical Activity: Not on file  ?Stress: Not on file  ?Social Connections: Not on file  ?Intimate Partner Violence: Not on file  ? ?Family History  ?Problem Relation Age of Onset  ? Hyperlipidemia Mother   ? Arrhythmia Father   ?     A-Fib   ? Hypertension Father   ? ? ? ? ?Review of Systems  ?All other systems reviewed and are negative. ? ?   ?Objective:  ? Physical Exam ?Vitals reviewed.  ?Constitutional:   ?   General: He is not in acute distress. ?   Appearance: He is well-developed. He is not diaphoretic.  ?HENT:  ?   Head: Normocephalic and atraumatic.  ?   Right Ear: External ear normal.  ?   Left Ear: External ear normal.  ?   Nose: Nose normal.  ?   Mouth/Throat:  ?   Pharynx: No oropharyngeal exudate.  ?Eyes:  ?   General: No scleral icterus.    ?   Right eye: No discharge.     ?   Left eye: No discharge.  ?   Conjunctiva/sclera: Conjunctivae normal.  ?   Pupils: Pupils are equal, round, and reactive to light.  ?Neck:  ?   Thyroid: No thyromegaly.  ?   Vascular: No JVD.  ?   Trachea: No tracheal deviation.  ?Cardiovascular:  ?   Rate and Rhythm: Normal rate and regular rhythm.  ?   Heart sounds: Normal heart sounds. No murmur heard. ?  No friction rub. No gallop.  ?Pulmonary:  ?   Effort: Pulmonary effort is normal. No respiratory distress.  ?   Breath sounds: Normal breath sounds. No stridor. No wheezing or rales.  ?Chest:  ?   Chest wall: No tenderness.  ?Abdominal:  ?   General: Bowel sounds are normal. There is no distension.  ?   Palpations: Abdomen is soft. There is no mass.  ?   Tenderness: There is no abdominal tenderness. There is no guarding or rebound.  ?Musculoskeletal:     ?    General: No tenderness. Normal range of motion.  ?   Cervical back: Normal range of motion and neck supple.  ?Lymphadenopathy:  ?   Cervical: No cervical adenopathy.  ?Skin: ?  General: Skin is warm.  ?   Coloration: Skin is not pale.  ?   Findings: No erythema or rash.  ?Neurological:  ?   Mental Status: He is alert and oriented to person, place, and time.  ?   Cranial Nerves: No cranial nerve deficit.  ?   Motor: No abnormal muscle tone.  ?   Coordination: Coordination normal.  ?   Deep Tendon Reflexes: Reflexes are normal and symmetric.  ?Psychiatric:     ?   Behavior: Behavior normal.     ?   Thought Content: Thought content normal.     ?   Judgment: Judgment normal.  ? ? ? ? ? ?   ?Assessment & Plan:  ?Migraine with aura and without status migrainosus, not intractable ?Patient's headaches sound like migraines.  Furthermore he had a negative head CT and MRI.  Recommended the patient try Maxalt 10 mg at the very first sign of headache.  If the pain persist he can take an additional pill in 2 hours but no more than 2 pills in 24 hours.  As long as the migraines are infrequent, this is how I would treat his headaches.  If they become more frequent, I would focus on preventative strategies such as Topamax or aimovig to reduce the frequency and intensity of the migraines. ?

## 2021-08-11 ENCOUNTER — Ambulatory Visit
Admission: EM | Admit: 2021-08-11 | Discharge: 2021-08-11 | Disposition: A | Discharge status: Home / Self Care | Payer: 59 | Attending: Family Medicine | Admitting: Family Medicine

## 2021-08-11 ENCOUNTER — Encounter: Payer: Self-pay | Admitting: Emergency Medicine

## 2021-08-11 DIAGNOSIS — M545 Low back pain, unspecified: Secondary | ICD-10-CM | POA: Diagnosis not present

## 2021-08-11 MED ORDER — KETOROLAC TROMETHAMINE 30 MG/ML IJ SOLN
30.0000 mg | Freq: Once | INTRAMUSCULAR | Status: AC
  Administered 2021-08-11: 30 mg via INTRAMUSCULAR

## 2021-08-11 MED ORDER — TIZANIDINE HCL 4 MG PO TABS: 4.0000 mg | ORAL_TABLET | Freq: Every day | ORAL | 0 refills | Status: DC

## 2021-08-11 MED ORDER — DEXAMETHASONE SODIUM PHOSPHATE 10 MG/ML IJ SOLN
10.0000 mg | Freq: Once | INTRAMUSCULAR | Status: AC
  Administered 2021-08-11: 10 mg via INTRAMUSCULAR

## 2021-08-11 NOTE — ED Triage Notes (Signed)
Pt presents with lower back pain x 2 days after picking up a concrete step. He has taken Advil for pain.  ?

## 2021-08-11 NOTE — ED Provider Notes (Signed)
?[facility] ? ? ? ?CSN: [id number] ?Arrival date & time: 08/11/21  1141 ? ? ?  ? ?History   ?Chief Complaint ?Chief Complaint  ?Patient presents with  ? Back Pain  ? ? ?HPI ?Darryl Ross is a 56 y.o. male.  ? ?HPI ?Patient presents for evaluation of low mid back pain after lifting heavy objects. He has a history of DDD of the cervical spine and SI joints.  He is not experiencing any pain radiating down his legs or radiating up the thoracic region.  He says occasionally with certain movements he will experience a pain that is somewhat sharp.  He has been taking Advil without any significant improvement of symptoms.  He denies any focal neurological symptoms. ?Past Medical History:  ?Diagnosis Date  ? Anxiety   ? Arthritis   ? hands, neck  ? Degenerative disc disease, cervical   ? no limitations  ? Hypercholesteremia   ? Hyperlipidemia   ? Hypertension   ? neg stress test several yrs ago  ? Lymphocytic colitis   ? ? ?Patient Active Problem List  ? Diagnosis Date Noted  ? DDD (degenerative disc disease), cervical 10/11/2017  ? Arthropathy of lumbar facet joint 03/04/2017  ? Inflammation of sacroiliac joint ([address]) 03/04/2017  ? Special screening for malignant neoplasms, colon   ? Hyperlipidemia   ? Hypertension   ? ? ?Past Surgical History:  ?Procedure Laterality Date  ? COLONOSCOPY WITH PROPOFOL N/A 03/04/2015  ? Procedure: COLONOSCOPY WITH PROPOFOL;  Surgeon: Lucilla Lame, MD;  Location: [facility];  Service: Endoscopy;  Laterality: N/A;  ? HAND SURGERY    ? fracture repair  ? ? ? ? ? ?Home Medications   ? ?Prior to Admission medications   ?Medication Sig Start Date End Date Taking? Authorizing Provider  ?tiZANidine (ZANAFLEX) 4 MG tablet Take 1 tablet (4 mg total) by mouth at bedtime. 08/11/21  Yes Scot Jun, FNP  ?ALPRAZolam (XANAX) 0.5 MG tablet Take 1 tablet (0.5 mg total) by mouth at bedtime as needed. 07/25/21   Susy Frizzle, MD  ?aspirin 81 MG tablet Take 81 mg by mouth daily.     [provider]  ?budesonide (ENTOCORT EC) 3 MG 24 hr capsule TAKE 3 CAPSULES (9 MG TOTAL) BY MOUTH EVERY MORNING. ?Patient taking differently: Take 9 mg by mouth as needed. 09/02/16   Susy Frizzle, MD  ?Glucosamine-Chondroitin (OSTEO BI-FLEX REGULAR STRENGTH PO) Take 2 tablets by mouth daily.    [provider]  ?metoprolol succinate (TOPROL-XL) 25 MG 24 hr tablet TAKE 1 TABLET BY MOUTH EVERY DAY 04/29/21   Susy Frizzle, MD  ?Multiple Vitamin (MULTIVITAMIN) tablet Take 1 tablet by mouth daily.    [provider]  ?Omega-3 Fatty Acids (FISH OIL) 1000 MG CAPS Take 2,000 mg by mouth daily.    [provider]  ?PARoxetine (PAXIL) 20 MG tablet TAKE 1 TABLET BY MOUTH EVERY DAY 04/01/21   Susy Frizzle, MD  ?rizatriptan (MAXALT) 10 MG tablet Take 1 tablet (10 mg total) by mouth as needed for migraine. May repeat in 2 hours if needed 07/25/21   Susy Frizzle, MD  ?rosuvastatin (CRESTOR) 5 MG tablet TAKE 1 TABLET BY MOUTH EVERY DAY 06/30/21   Susy Frizzle, MD  ?valsartan (DIOVAN) 80 MG tablet Take 1 tablet (80 mg total) by mouth daily. 07/25/21   Susy Frizzle, MD  ? ? ?Family History ?Family History  ?Problem Relation Age of Onset  ?  Hyperlipidemia Mother   ? Arrhythmia Father   ?     A-Fib   ? Hypertension Father   ? ? ?Social History ?Social History  ? ?Tobacco Use  ? Smoking status: Never  ? Smokeless tobacco: Never  ?Vaping Use  ? Vaping Use: Never used  ?Substance Use Topics  ? Alcohol use: Yes  ?  Alcohol/week: 4.0 standard drinks  ?  Types: 3 Cans of beer, 1 Shots of liquor per week  ?  Comment: occasional  ? Drug use: No  ? ? ? ?Allergies   ?Morphine and related, Statins, and Zetia [ezetimibe] ? ? ?Review of Systems ?Review of Systems ?Pertinent negatives listed in HPI  ? ?Physical Exam ?Triage Vital Signs ?ED Triage Vitals  ?Enc Vitals Group  ?   BP 08/11/21 1220 125/75  ?   Pulse Rate 08/11/21 1220 (!) 55  ?   Resp 08/11/21 1220 16  ?   Temp 08/11/21 1220  98 ?F (36.7 ?C)  ?   Temp Source 08/11/21 1220 Oral  ?   SpO2 08/11/21 1220 96 %  ?   Weight --   ?   Height --   ?   Head Circumference --   ?   Peak Flow --   ?   Pain Score 08/11/21 1219 7  ?   Pain Loc --   ?   Pain Edu? --   ?   Excl. in [place]? --   ? ?No data found. ? ?Updated Vital Signs ?BP 125/75 (BP Location: Left Arm)   Pulse (!) 55   Temp 98 ?F (36.7 ?C) (Oral)   Resp 16   SpO2 96%  ? ?Visual Acuity ?Right Eye Distance:   ?Left Eye Distance:   ?Bilateral Distance:   ? ?Right Eye Near:   ?Left Eye Near:    ?Bilateral Near:    ? ?Physical Exam ?Constitutional:   ?   Appearance: Normal appearance.  ?Cardiovascular:  ?   Rate and Rhythm: Normal rate and regular rhythm.  ?Pulmonary:  ?   Effort: Pulmonary effort is normal.  ?   Breath sounds: Normal breath sounds.  ?Musculoskeletal:  ?   Lumbar back: Bony tenderness present. No swelling or spasms. Decreased range of motion. Negative right straight leg raise test and negative left straight leg raise test.  ?Neurological:  ?   Mental Status: He is alert and oriented to person, place, and time.  ?   GCS: GCS eye subscore is 4. GCS verbal subscore is 5. GCS motor subscore is 6.  ?   Coordination: Coordination is intact.  ?   Gait: Gait is intact.  ? ? ?UC Treatments / Results  ?Labs ?(all labs ordered are listed, but only abnormal results are displayed) ?Labs Reviewed - No data to display ? ?EKG ? ? ?Radiology ?No results found. ? ?Procedures ?Procedures (including critical care time) ? ?Medications Ordered in UC ?Medications  ?dexamethasone (DECADRON) injection 10 mg (10 mg Intramuscular Given 08/11/21 1249)  ?ketorolac (TORADOL) 30 MG/ML injection 30 mg (30 mg Intramuscular Given 08/11/21 1249)  ? ? ?Initial Impression / Assessment and Plan / UC Course  ?I have reviewed the triage vital signs and the nursing notes. ? ?Pertinent labs & imaging results that were available during my care of the patient were reviewed by me and considered in my medical decision making  (see chart for details). ? ?  ?Acute midline low back pain without sciatica ?Decadron and Toradol IM given. ?Outpatient  management with Tizanidine PRN. ?RTC PRN  ?Final Clinical Impressions(s) / UC Diagnoses  ? ?Final diagnoses:  ?Acute midline low back pain without sciatica  ? ?Discharge Instructions   ?None ?  ? ?ED Prescriptions   ? ? Medication Sig Dispense Auth. Provider  ? tiZANidine (ZANAFLEX) 4 MG tablet Take 1 tablet (4 mg total) by mouth at bedtime. 20 tablet Scot Jun, FNP  ? ?  ? ?PDMP not reviewed this encounter. ?  ?Scot Jun, FNP ?08/11/21 1256 ? ?

## 2021-09-09 ENCOUNTER — Other Ambulatory Visit: Payer: Self-pay | Admitting: Family Medicine

## 2021-09-10 NOTE — Telephone Encounter (Signed)
Requested Prescriptions  Pending Prescriptions Disp Refills  . PARoxetine (PAXIL) 20 MG tablet [Pharmacy Med Name: PAROXETINE HCL 20 MG TABLET] 90 tablet 0    Sig: TAKE 1 TABLET BY MOUTH EVERY DAY     Psychiatry:  Antidepressants - SSRI Passed - 09/09/2021  4:02 PM      Passed - Valid encounter within last 6 months    Recent Outpatient Visits          1 month ago Migraine with aura and without status migrainosus, not intractable   [place] Pickard, Cammie Mcgee, MD   5 months ago Prostate cancer screening   [place] Susy Frizzle, MD   2 years ago COVID-4 virus infection   [place] Pickard, Cammie Mcgee, MD   2 years ago Routine general medical examination at a health care facility   [place], Cammie Mcgee, MD   3 years ago Pure hypercholesterolemia   [place] Pickard, Cammie Mcgee, MD

## 2021-11-20 ENCOUNTER — Other Ambulatory Visit: Payer: Self-pay | Admitting: Family Medicine

## 2021-11-25 ENCOUNTER — Other Ambulatory Visit: Payer: Self-pay | Admitting: Family Medicine

## 2022-03-10 ENCOUNTER — Other Ambulatory Visit: Payer: Self-pay | Admitting: Family Medicine

## 2022-03-10 NOTE — Telephone Encounter (Signed)
Requested Prescriptions  Pending Prescriptions Disp Refills   rizatriptan (MAXALT) 10 MG tablet [Pharmacy Med Name: RIZATRIPTAN 10 MG TABLET] 10 tablet 0    Sig: TAKE 1 TABLET BY MOUTH AS NEEDED FOR MIGRAINE. MAY REPEAT IN 2 HOURS IF NEEDED     Neurology:  Migraine Therapy - Triptan Passed - 03/10/2022  9:51 AM      Passed - Last BP in normal range    BP Readings from Last 1 Encounters:  08/11/21 125/75         Passed - Valid encounter within last 12 months    Recent Outpatient Visits           7 months ago Migraine with aura and without status migrainosus, not intractable   [place] Pickard, Cammie Mcgee, MD   11 months ago Prostate cancer screening   [place] Susy Frizzle, MD   2 years ago COVID-13 virus infection   [place] Pickard, Cammie Mcgee, MD   3 years ago Routine general medical examination at a health care facility   [place], Cammie Mcgee, MD   4 years ago Pure hypercholesterolemia   [place] Pickard, Cammie Mcgee, MD       Future Appointments             In 3 weeks Pickard, Cammie Mcgee, MD [place]

## 2022-03-26 ENCOUNTER — Other Ambulatory Visit: Payer: 59

## 2022-03-26 DIAGNOSIS — I10 Essential (primary) hypertension: Secondary | ICD-10-CM

## 2022-03-26 DIAGNOSIS — Z125 Encounter for screening for malignant neoplasm of prostate: Secondary | ICD-10-CM

## 2022-03-26 DIAGNOSIS — E7800 Pure hypercholesterolemia, unspecified: Secondary | ICD-10-CM

## 2022-03-27 LAB — COMPLETE METABOLIC PANEL WITH GFR
AG Ratio: 2.4 (calc) (ref 1.0–2.5)
ALT: 32 U/L (ref 9–46)
AST: 25 U/L (ref 10–35)
Albumin: 4.5 g/dL (ref 3.6–5.1)
Alkaline phosphatase (APISO): 39 U/L (ref 35–144)
BUN: 15 mg/dL (ref 7–25)
CO2: 27 mmol/L (ref 20–32)
Calcium: 9.2 mg/dL (ref 8.6–10.3)
Chloride: 104 mmol/L (ref 98–110)
Creat: 1.1 mg/dL (ref 0.70–1.30)
Globulin: 1.9 g/dL (calc) (ref 1.9–3.7)
Glucose, Bld: 91 mg/dL (ref 65–99)
Potassium: 4.1 mmol/L (ref 3.5–5.3)
Sodium: 141 mmol/L (ref 135–146)
Total Bilirubin: 0.8 mg/dL (ref 0.2–1.2)
Total Protein: 6.4 g/dL (ref 6.1–8.1)
eGFR: 78 mL/min/{1.73_m2} (ref >=60)

## 2022-03-27 LAB — CBC WITH DIFFERENTIAL/PLATELET
Absolute Monocytes: 474 cells/uL (ref 200–950)
Basophils Absolute: 31 cells/uL (ref 0–200)
Basophils Relative: 0.6 %
Eosinophils Absolute: 301 cells/uL (ref 15–500)
Eosinophils Relative: 5.9 %
HCT: 43.8 % (ref 38.5–50.0)
Hemoglobin: 14.9 g/dL (ref 13.2–17.1)
Lymphs Abs: 1255 cells/uL (ref 850–3900)
MCH: 32.5 pg (ref 27.0–33.0)
MCHC: 34 g/dL (ref 32.0–36.0)
MCV: 95.4 fL (ref 80.0–100.0)
MPV: 11 fL (ref 7.5–12.5)
Monocytes Relative: 9.3 %
Neutro Abs: 3040 cells/uL (ref 1500–7800)
Neutrophils Relative %: 59.6 %
Platelets: 245 10*3/uL (ref 140–400)
RBC: 4.59 10*6/uL (ref 4.20–5.80)
RDW: 11.9 % (ref 11.0–15.0)
Total Lymphocyte: 24.6 %
WBC: 5.1 10*3/uL (ref 3.8–10.8)

## 2022-03-27 LAB — LIPID PANEL
Cholesterol: 166 mg/dL (ref <200)
HDL: 64 mg/dL (ref >=40)
LDL Cholesterol (Calc): 87 mg/dL (calc)
Non-HDL Cholesterol (Calc): 102 mg/dL (calc) (ref <130)
Total CHOL/HDL Ratio: 2.6 (calc) (ref <5.0)
Triglycerides: 63 mg/dL (ref <150)

## 2022-03-27 LAB — PSA: PSA: 0.27 ng/mL (ref <=4.00)

## 2022-04-03 ENCOUNTER — Ambulatory Visit (INDEPENDENT_AMBULATORY_CARE_PROVIDER_SITE_OTHER): Payer: 59 | Admitting: Family Medicine

## 2022-04-03 ENCOUNTER — Encounter: Payer: Self-pay | Admitting: Family Medicine

## 2022-04-03 VITALS — BP 132/76 | HR 56 | Ht 73.0 in | Wt 198.6 lb

## 2022-04-03 DIAGNOSIS — R931 Abnormal findings on diagnostic imaging of heart and coronary circulation: Secondary | ICD-10-CM

## 2022-04-03 DIAGNOSIS — E7800 Pure hypercholesterolemia, unspecified: Secondary | ICD-10-CM | POA: Diagnosis not present

## 2022-04-03 DIAGNOSIS — M5030 Other cervical disc degeneration, unspecified cervical region: Secondary | ICD-10-CM | POA: Diagnosis not present

## 2022-04-03 DIAGNOSIS — Z0000 Encounter for general adult medical examination without abnormal findings: Secondary | ICD-10-CM | POA: Diagnosis not present

## 2022-04-03 NOTE — Progress Notes (Signed)
Subjective:    Patient ID: Darryl Ross, male    DOB: 06-03-1965, 56 y.o.   MRN: [id number]  HPI Patient is here for complete physical exam.  Patient had a colonoscopy in 2016 which showed microscopic colitis but was otherwise normal.  He is due for repeat colonoscopy in 2026.  He is due for prostate cancer screening.  Patient had a coronary artery calcium score last year that showed plaque in his left LAD.  For that reason I would like to get his LDL cholesterol less than 70.  He is currently taking Crestor 2 days a week.  He is tolerating this without difficulty.  He is having occasional migraines that he attributes to nerve impingement due to cervical degenerative disc disease.  Otherwise he is doing well.  He is due for the shingles vaccine.  He declines a COVID shot and RSV shot Immunization History  Administered Date(s) Administered   DTaP 03/06/2009   Influenza, Quadrivalent, Recombinant, Inj, Pf 03/11/2021   Influenza, Seasonal, Injecte, Preservative Fre 03/04/2020   Influenza-Unspecified 02/03/2016, 01/23/2017, 12/31/2017, 01/04/2019   PFIZER(Purple Top)SARS-COV-2 Vaccination 06/24/2019, 07/18/2019   Tdap 11/04/2013    Lab on 03/26/2022  Component Date Value Ref Range Status   WBC 03/26/2022 5.1  3.8 - 10.8 Thousand/uL Final   RBC 03/26/2022 4.59  4.20 - 5.80 Million/uL Final   Hemoglobin 03/26/2022 14.9  13.2 - 17.1 g/dL Final   HCT 03/26/2022 43.8  38.5 - 50.0 % Final   MCV 03/26/2022 95.4  80.0 - 100.0 fL Final   MCH 03/26/2022 32.5  27.0 - 33.0 pg Final   MCHC 03/26/2022 34.0  32.0 - 36.0 g/dL Final   RDW 03/26/2022 11.9  11.0 - 15.0 % Final   Platelets 03/26/2022 245  140 - 400 Thousand/uL Final   MPV 03/26/2022 11.0  7.5 - 12.5 fL Final   Neutro Abs 03/26/2022 3,040  1,500 - 7,800 cells/uL Final   Lymphs Abs 03/26/2022 1,255  850 - 3,900 cells/uL Final   Absolute Monocytes 03/26/2022 474  200 - 950 cells/uL Final   Eosinophils Absolute 03/26/2022 301  15 - 500  cells/uL Final   Basophils Absolute 03/26/2022 31  0 - 200 cells/uL Final   Neutrophils Relative % 03/26/2022 59.6  % Final   Total Lymphocyte 03/26/2022 24.6  % Final   Monocytes Relative 03/26/2022 9.3  % Final   Eosinophils Relative 03/26/2022 5.9  % Final   Basophils Relative 03/26/2022 0.6  % Final   Glucose, Bld 03/26/2022 91  65 - 99 mg/dL Final   Comment: .            Fasting reference interval .    BUN 03/26/2022 15  7 - 25 mg/dL Final   Creat 03/26/2022 1.10  0.70 - 1.30 mg/dL Final   eGFR 03/26/2022 78  > OR = 60 mL/min/1.81m Final   BUN/Creatinine Ratio 03/26/2022 SEE NOTE:  6 - 22 (calc) Final   Comment:    Not Reported: BUN and Creatinine are within    reference range. .    Sodium 03/26/2022 141  135 - 146 mmol/L Final   Potassium 03/26/2022 4.1  3.5 - 5.3 mmol/L Final   Chloride 03/26/2022 104  98 - 110 mmol/L Final   CO2 03/26/2022 27  20 - 32 mmol/L Final   Calcium 03/26/2022 9.2  8.6 - 10.3 mg/dL Final   Total Protein 03/26/2022 6.4  6.1 - 8.1 g/dL Final   Albumin 03/26/2022 4.5  3.6 -  5.1 g/dL Final   Globulin 03/26/2022 1.9  1.9 - 3.7 g/dL (calc) Final   AG Ratio 03/26/2022 2.4  1.0 - 2.5 (calc) Final   Total Bilirubin 03/26/2022 0.8  0.2 - 1.2 mg/dL Final   Alkaline phosphatase (APISO) 03/26/2022 39  35 - 144 U/L Final   AST 03/26/2022 25  10 - 35 U/L Final   ALT 03/26/2022 32  9 - 46 U/L Final   Cholesterol 03/26/2022 166  <200 mg/dL Final   HDL 03/26/2022 64  > OR = 40 mg/dL Final   Triglycerides 03/26/2022 63  <150 mg/dL Final   LDL Cholesterol (Calc) 03/26/2022 87  mg/dL (calc) Final   Comment: Reference range: <100 . Desirable range <100 mg/dL for primary prevention;   <70 mg/dL for patients with CHD or diabetic patients  with > or = 2 CHD risk factors. Marland Kitchen LDL-C is now calculated using the Martin-Hopkins  calculation, which is a validated novel method providing  better accuracy than the Friedewald equation in the  estimation of LDL-C.  Cresenciano Genre et al. Annamaria Helling. 4540;981(19): 2061-2068  (http://education.[facility].com/faq/FAQ164)    Total CHOL/HDL Ratio 03/26/2022 2.6  <5.0 (calc) Final   Non-HDL Cholesterol (Calc) 03/26/2022 102  <130 mg/dL (calc) Final   Comment: For patients with diabetes plus 1 major ASCVD risk  factor, treating to a non-HDL-C goal of <100 mg/dL  (LDL-C of <70 mg/dL) is considered a therapeutic  option.    PSA 03/26/2022 0.27  < OR = 4.00 ng/mL Final   Comment: The total PSA value from this assay system is  standardized against the WHO standard. The test  result will be approximately 20% lower when compared  to the equimolar-standardized total PSA (Beckman  Coulter). Comparison of serial PSA results should be  interpreted with this fact in mind. . This test was performed using the Siemens  chemiluminescent method. Values obtained from  different assay methods cannot be used interchangeably. PSA levels, regardless of value, should not be interpreted as absolute evidence of the presence or absence of disease.     Past Medical History:  Diagnosis Date   Anxiety    Arthritis    hands, neck   Degenerative disc disease, cervical    no limitations   Hypercholesteremia    Hyperlipidemia    Hypertension    neg stress test several yrs ago   Lymphocytic colitis    Past Surgical History:  Procedure Laterality Date   COLONOSCOPY WITH PROPOFOL N/A 03/04/2015   Procedure: COLONOSCOPY WITH PROPOFOL;  Surgeon: Lucilla Lame, MD;  Location: [place];  Service: Endoscopy;  Laterality: N/A;   HAND SURGERY     fracture repair   Current Outpatient Medications on File Prior to Visit  Medication Sig Dispense Refill   ALPRAZolam (XANAX) 0.5 MG tablet Take 1 tablet (0.5 mg total) by mouth at bedtime as needed. 30 tablet 0   aspirin 81 MG tablet Take 81 mg by mouth daily.     budesonide (ENTOCORT EC) 3 MG 24 hr capsule TAKE 3 CAPSULES (9 MG TOTAL) BY MOUTH EVERY MORNING. (Patient taking  differently: Take 9 mg by mouth as needed.) 90 capsule 1   Glucosamine-Chondroitin (OSTEO BI-FLEX REGULAR STRENGTH PO) Take 2 tablets by mouth daily.     metoprolol succinate (TOPROL-XL) 25 MG 24 hr tablet TAKE 1 TABLET BY MOUTH EVERY DAY 90 tablet 3   Multiple Vitamin (MULTIVITAMIN) tablet Take 1 tablet by mouth daily.     Omega-3 Fatty Acids (FISH  OIL) 1000 MG CAPS Take 2,000 mg by mouth daily.     PARoxetine (PAXIL) 20 MG tablet TAKE 1 TABLET BY MOUTH EVERY DAY 90 tablet 3   rizatriptan (MAXALT) 10 MG tablet TAKE 1 TABLET BY MOUTH AS NEEDED FOR MIGRAINE. MAY REPEAT IN 2 HOURS IF NEEDED 10 tablet 0   rosuvastatin (CRESTOR) 5 MG tablet TAKE 1 TABLET BY MOUTH EVERY DAY 90 tablet 1   No current facility-administered medications on file prior to visit.   Allergies  Allergen Reactions   Morphine And Related Nausea Only   Statins Other (See Comments)    Effected liver enzymes   Zetia [Ezetimibe]     Elevated LFT's   Social History   Socioeconomic History   Marital status: Married    Spouse name: Not on file   Number of children: Not on file   Years of education: Not on file   Highest education level: Not on file  Occupational History   Not on file  Tobacco Use   Smoking status: Never   Smokeless tobacco: Never  Vaping Use   Vaping Use: Never used  Substance and Sexual Activity   Alcohol use: Yes    Alcohol/week: 4.0 standard drinks of alcohol    Types: 3 Cans of beer, 1 Shots of liquor per week    Comment: occasional   Drug use: No   Sexual activity: Not on file    Comment: avid runner and cyclist.  Married.  Other Topics Concern   Not on file  Social History Narrative   Not on file   Social Determinants of Health   Financial Resource Strain: Not on file  Food Insecurity: Not on file  Transportation Needs: Not on file  Physical Activity: Not on file  Stress: Not on file  Social Connections: Not on file  Intimate Partner Violence: Not on file   Family History   Problem Relation Age of Onset   Hyperlipidemia Mother    Arrhythmia Father        A-Fib    Hypertension Father       Review of Systems  All other systems reviewed and are negative.      Objective:   Physical Exam Vitals reviewed.  Constitutional:      General: He is not in acute distress.    Appearance: He is well-developed. He is not diaphoretic.  HENT:     Head: Normocephalic and atraumatic.     Right Ear: External ear normal.     Left Ear: External ear normal.     Nose: Nose normal.     Mouth/Throat:     Pharynx: No oropharyngeal exudate.  Eyes:     General: No scleral icterus.       Right eye: No discharge.        Left eye: No discharge.     Conjunctiva/sclera: Conjunctivae normal.     Pupils: Pupils are equal, round, and reactive to light.  Neck:     Thyroid: No thyromegaly.     Vascular: No JVD.     Trachea: No tracheal deviation.  Cardiovascular:     Rate and Rhythm: Normal rate and regular rhythm.     Heart sounds: Normal heart sounds. No murmur heard.    No friction rub. No gallop.  Pulmonary:     Effort: Pulmonary effort is normal. No respiratory distress.     Breath sounds: Normal breath sounds. No stridor. No wheezing or rales.  Chest:  Chest wall: No tenderness.  Abdominal:     General: Bowel sounds are normal. There is no distension.     Palpations: Abdomen is soft. There is no mass.     Tenderness: There is no abdominal tenderness. There is no guarding or rebound.  Musculoskeletal:        General: No tenderness. Normal range of motion.     Cervical back: Normal range of motion and neck supple.  Lymphadenopathy:     Cervical: No cervical adenopathy.  Skin:    General: Skin is warm.     Coloration: Skin is not pale.     Findings: No erythema or rash.  Neurological:     Mental Status: He is alert and oriented to person, place, and time.     Cranial Nerves: No cranial nerve deficit.     Motor: No abnormal muscle tone.     Coordination:  Coordination normal.     Deep Tendon Reflexes: Reflexes are normal and symmetric.  Psychiatric:        Behavior: Behavior normal.        Thought Content: Thought content normal.        Judgment: Judgment normal.           Assessment & Plan:  General medical exam  DDD (degenerative disc disease), cervical  Pure hypercholesterolemia  Elevated coronary artery calcium score I would like to try his LDL cholesterol less than 70.  Therefore I asked him to take Crestor 3 days a week, Monday Wednesday Friday.  He is dealing with bradycardia and having orthostatic dizziness so I will stop metoprolol.  He is complaining of migraines so we discussed starting aimovig but the patient politely declines at the present time.  Recommended the shingles vaccine.  Recommended a COVID booster.  Do not feel that the patient needs RSV vaccine.  Colonoscopy and prostate cancer screening are up-to-date

## 2022-04-07 ENCOUNTER — Other Ambulatory Visit: Payer: Self-pay | Admitting: Family Medicine

## 2022-04-16 ENCOUNTER — Other Ambulatory Visit: Payer: Self-pay | Admitting: Family Medicine

## 2022-04-16 NOTE — Telephone Encounter (Signed)
Requested Prescriptions  Pending Prescriptions Disp Refills   metoprolol succinate (TOPROL-XL) 25 MG 24 hr tablet [Pharmacy Med Name: METOPROLOL SUCC ER 25 MG TAB] 90 tablet 3    Sig: TAKE 1 TABLET BY MOUTH EVERY DAY     Cardiovascular:  Beta Blockers Failed - 04/16/2022  1:29 AM      Failed - Valid encounter within last 6 months    Recent Outpatient Visits           8 months ago Migraine with aura and without status migrainosus, not intractable   [place] Pickard, Cammie Mcgee, MD   1 year ago Prostate cancer screening   [place] Susy Frizzle, MD   3 years ago COVID-76 virus infection   [place] Pickard, Cammie Mcgee, MD   3 years ago Routine general medical examination at a health care facility   [place], Cammie Mcgee, MD   4 years ago Pure hypercholesterolemia   [place] Pickard, Cammie Mcgee, MD       Future Appointments             In 48 months Pickard, Cammie Mcgee, MD [place]            Passed - Last BP in normal range    BP Readings from Last 1 Encounters:  04/03/22 132/76         Passed - Last Heart Rate in normal range    Pulse Readings from Last 1 Encounters:  04/03/22 (!) 56

## 2022-04-26 IMAGING — MR MR HEAD W/O CM
11 series · 48 of 48 positions shown · non-contrast
Comparison: No prior MRI, correlation is made with CT head
07/23/2021

CLINICAL DATA: Headaches, vision changes

EXAM:
MRI HEAD WITHOUT CONTRAST
TECHNIQUE: Multiplanar, multiecho pulse sequences of the brain and surrounding
structures were obtained without intravenous contrast.

[Series 5: ax dwi_tracew · axial · 3.0mm · 0.65mm/px · z∈[-130,+22]mm · 3 of 48 slices shown]
[im 1/48]
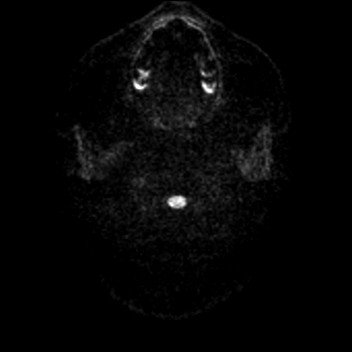
[im 24/48]
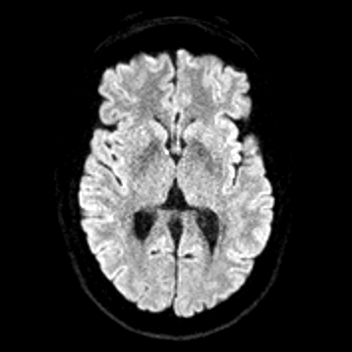
[im 48/48]
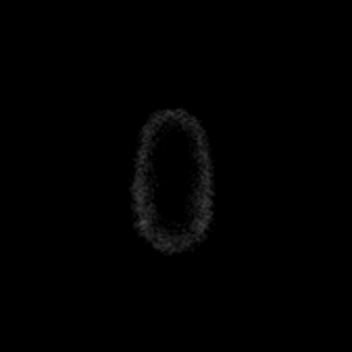

[Series 6: ax dwi_adc · axial · 3.0mm · 0.65mm/px · z∈[-130,+22]mm · 4 of 48 slices shown]
[im 1/48]
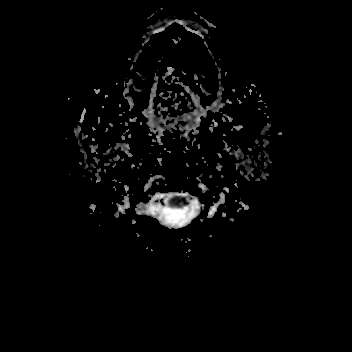
[im 16/48]
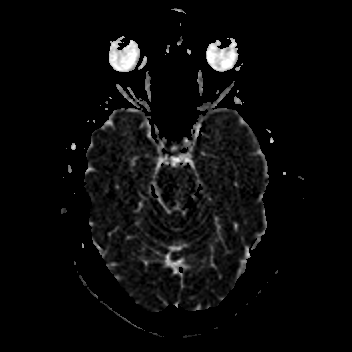
[im 32/48]
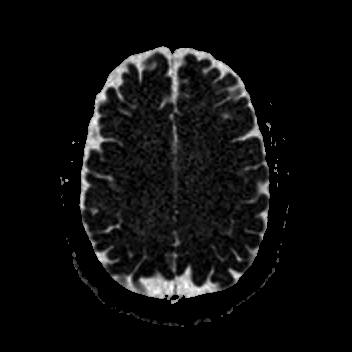
[im 48/48]
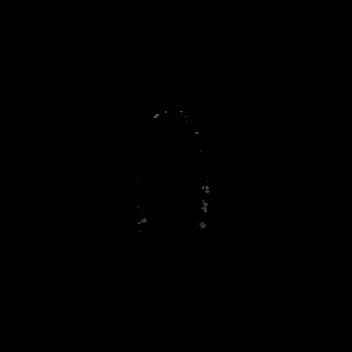

[Series 7: cor dwi_tracew · coronal · 5.0mm · 0.68mm/px · 3 of 40 slices shown]
[im 1/40]
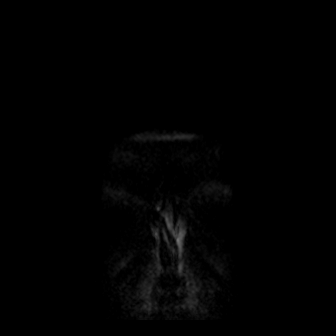
[im 20/40]
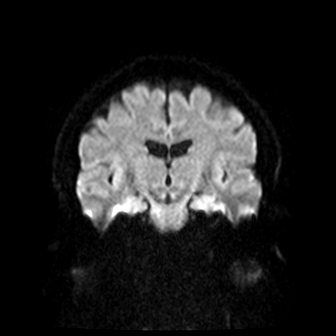
[im 40/40]
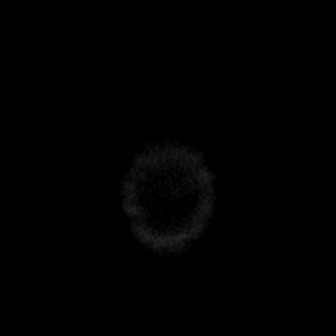

[Series 8: cor dwi_adc · coronal · 5.0mm · 0.68mm/px · 3 of 40 slices shown]
[im 1/40]
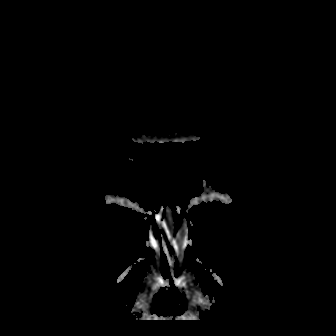
[im 20/40]
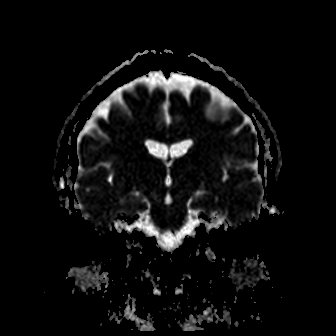
[im 40/40]
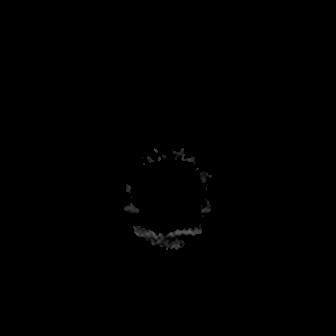

[Series 9: T1 · sagittal · 5.0mm · 0.62mm/px · 2 of 23 slices shown (1 of 2)]
[im 1/23]
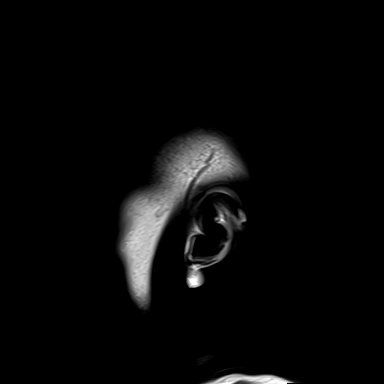
[im 23/23]
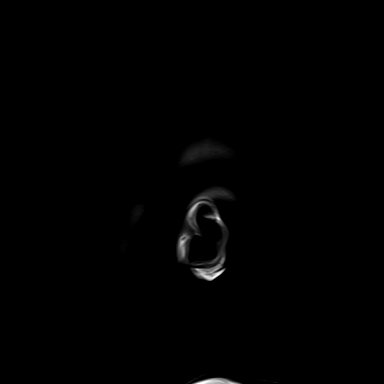

[Series 10: T2 · axial · 5.0mm · 0.53mm/px · z∈[-126,+26]mm · 2 of 27 slices shown (1 of 2)]
[im 1/27]
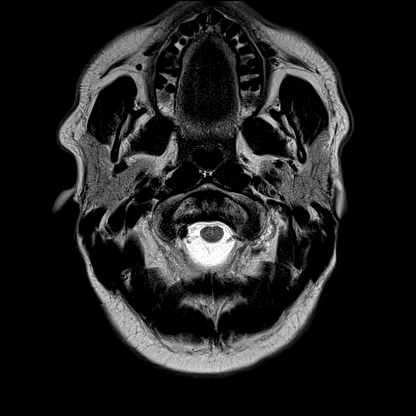
[im 27/27]
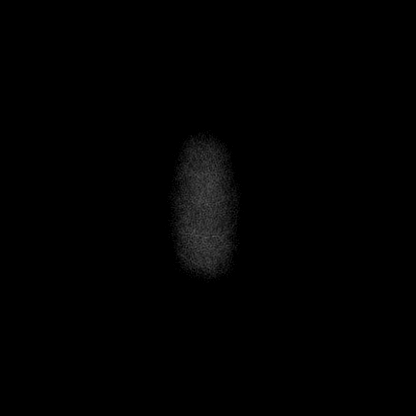

[Series 12: ax swi_pha · axial · 2.0mm · 0.90mm/px · z∈[-128,+26]mm · 6 of 80 slices shown]
[im 1/80]
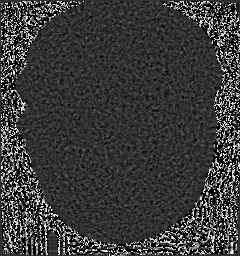
[im 16/80]
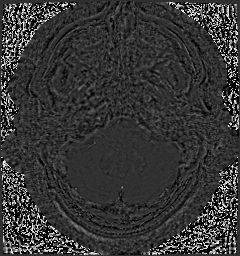
[im 32/80]
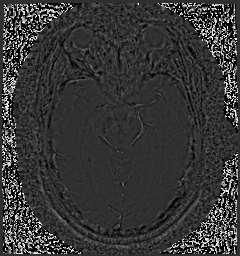
[im 48/80]
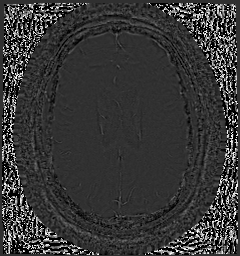
[im 64/80]
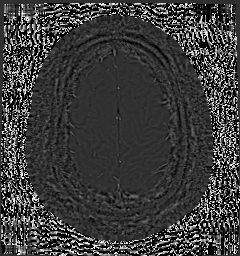
[im 80/80]
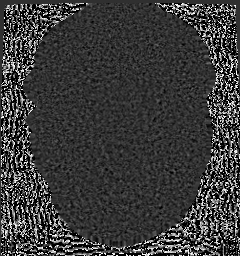

[Series 13: ax swi_swi · axial · 2.0mm · 0.90mm/px · z∈[-128,+26]mm · 6 of 80 slices shown]
[im 1/80]
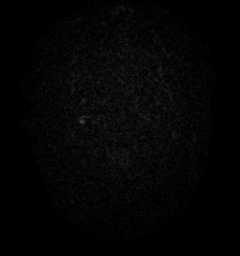
[im 16/80]
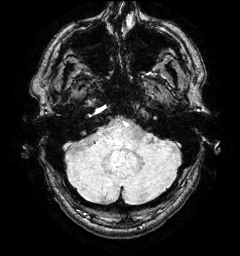
[im 32/80]
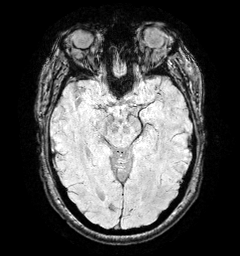
[im 48/80]
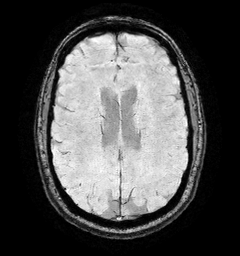
[im 64/80]
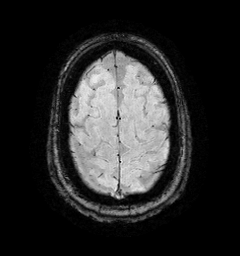
[im 80/80]
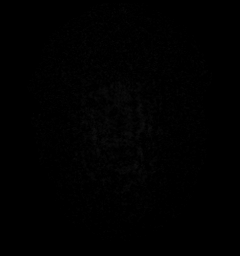

[Series 15: FLAIR · axial · 3.0mm · 0.69mm/px · z∈[-129,+29]mm · 4 of 55 slices shown]
[im 1/55]
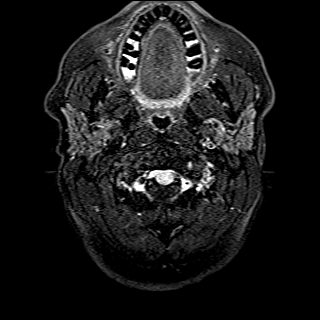
[im 19/55]
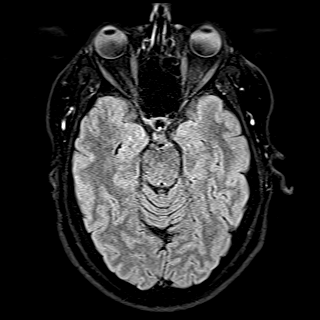
[im 37/55]
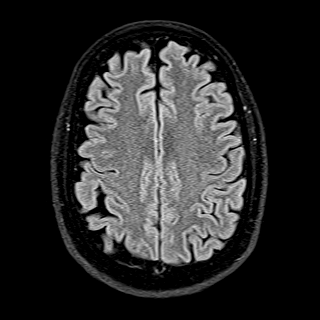
[im 55/55]
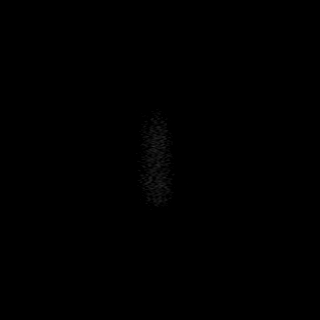

[Series 16: T1 · axial · 1.0mm · 0.98mm/px · z∈[-138,+33]mm · 13 of 175 slices shown (2 of 2)]
[im 1/175]
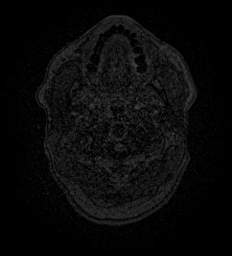
[im 15/175]
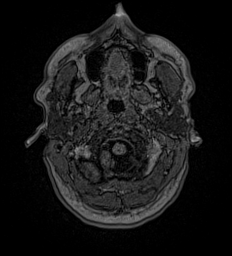
[im 30/175]
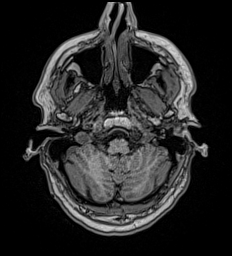
[im 44/175]
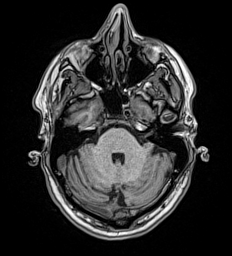
[im 59/175]
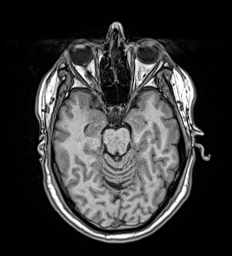
[im 73/175]
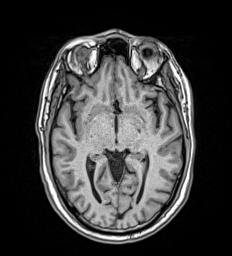
[im 88/175]
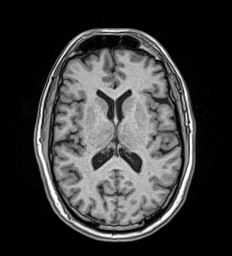
[im 102/175]
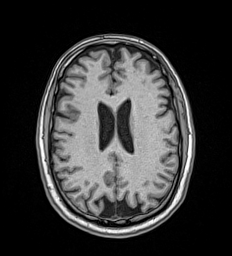
[im 117/175]
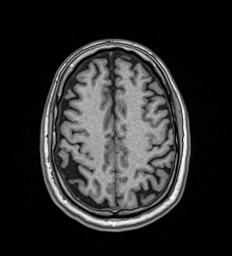
[im 131/175]
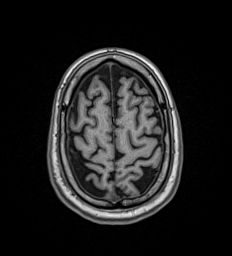
[im 146/175]
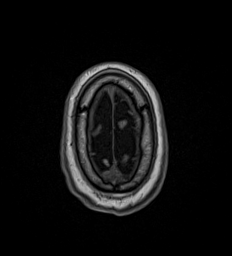
[im 160/175]
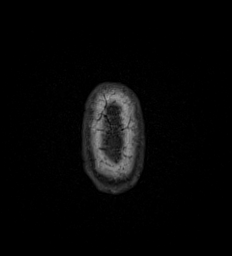
[im 175/175]
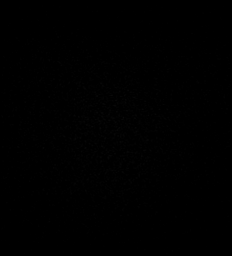

[Series 17: T2 · coronal · 5.0mm · 0.57mm/px · 2 of 31 slices shown (2 of 2)]
[im 1/31]
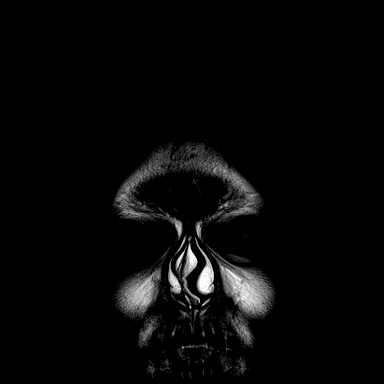
[im 31/31]
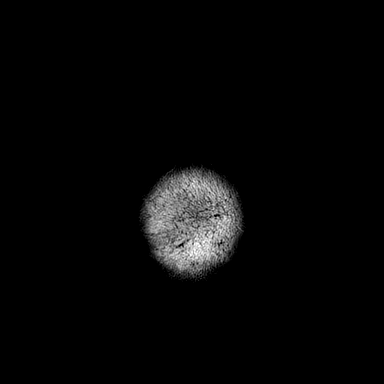

[48 of 48 positions shown; findings below may reference images not displayed]

FINDINGS: Brain: No restricted diffusion to suggest acute or subacute infarct.
No acute hemorrhage, mass, mass effect, or midline shift. No foci of
hemosiderin deposition to suggest remote hemorrhage. No
hydrocephalus or extra-axial collection.

Vascular: Normal flow voids.

Skull and upper cervical spine: Normal marrow signal.

Sinuses/Orbits: Clear paranasal sinuses. The orbits are
unremarkable.

Other: Trace fluid in the mastoid air cells.
IMPRESSION: No acute intracranial process. No etiology is seen for the patient's
headaches or vision changes.

## 2022-06-06 ENCOUNTER — Ambulatory Visit (INDEPENDENT_AMBULATORY_CARE_PROVIDER_SITE_OTHER): Payer: 59

## 2022-06-06 ENCOUNTER — Encounter: Payer: Self-pay | Admitting: Emergency Medicine

## 2022-06-06 ENCOUNTER — Ambulatory Visit
Admission: EM | Admit: 2022-06-06 | Discharge: 2022-06-06 | Disposition: A | Discharge status: Home / Self Care | Payer: 59 | Attending: Urgent Care | Admitting: Urgent Care

## 2022-06-06 DIAGNOSIS — S6722XA Crushing injury of left hand, initial encounter: Secondary | ICD-10-CM

## 2022-06-06 NOTE — Discharge Instructions (Addendum)
X-ray today indicates no acute fracture or dislocation.  Recommending use of cold therapy, NSAID medications such as ibuprofen or naproxen for pain.  You may wear a hand brace for comfort and stabilization.

## 2022-06-06 NOTE — ED Triage Notes (Signed)
Left hand injury yesterday. Was pulling upwards on pipe, when it broke loose his hand smashed into a cast iron sink. Redness, swelling, tenderness over snuffbox area.

## 2022-06-06 NOTE — ED Provider Notes (Signed)
Roderic Palau    CSN: [id number] Arrival date & time: 06/06/22  1207      History   Chief Complaint Chief Complaint  Patient presents with   Hand Injury    HPI VIRAJ MCINERNY is a 57 y.o. male.    Hand Injury   This to urgent care with report of left hand injury which occurred yesterday.  Patient states he was pulling upwards on a pipe when the pipe broke loose and smashed hand into a cast iron sink.  He endorses redness, swelling, tenderness at the first metatarsal.  Past Medical History:  Diagnosis Date   Anxiety    Arthritis    hands, neck   Degenerative disc disease, cervical    no limitations   Hypercholesteremia    Hyperlipidemia    Hypertension    neg stress test several yrs ago   Lymphocytic colitis     Patient Active Problem List   Diagnosis Date Noted   DDD (degenerative disc disease), cervical 10/11/2017   Arthropathy of lumbar facet joint 03/04/2017   Inflammation of sacroiliac joint ([place]) 03/04/2017   Special screening for malignant neoplasms, colon    Hyperlipidemia    Hypertension     Past Surgical History:  Procedure Laterality Date   COLONOSCOPY WITH PROPOFOL N/A 03/04/2015   Procedure: COLONOSCOPY WITH PROPOFOL;  Surgeon: Lucilla Lame, MD;  Location: [place];  Service: Endoscopy;  Laterality: N/A;   HAND SURGERY     fracture repair       Home Medications    Prior to Admission medications   Medication Sig Start Date End Date Taking? Authorizing Provider  ALPRAZolam Duanne Moron) 0.5 MG tablet Take 1 tablet (0.5 mg total) by mouth at bedtime as needed. 07/25/21   Susy Frizzle, MD  aspirin 81 MG tablet Take 81 mg by mouth daily.    [provider]  budesonide (ENTOCORT EC) 3 MG 24 hr capsule TAKE 3 CAPSULES (9 MG TOTAL) BY MOUTH EVERY MORNING. Patient taking differently: Take 9 mg by mouth as needed. 09/02/16   Susy Frizzle, MD  Glucosamine-Chondroitin (OSTEO BI-FLEX REGULAR STRENGTH PO) Take 2 tablets by  mouth daily.    [provider]  metoprolol succinate (TOPROL-XL) 25 MG 24 hr tablet TAKE 1 TABLET BY MOUTH EVERY DAY 04/16/22   Susy Frizzle, MD  Multiple Vitamin (MULTIVITAMIN) tablet Take 1 tablet by mouth daily.    [provider]  Omega-3 Fatty Acids (FISH OIL) 1000 MG CAPS Take 2,000 mg by mouth daily.    [provider]  PARoxetine (PAXIL) 20 MG tablet TAKE 1 TABLET BY MOUTH EVERY DAY 11/20/21   Susy Frizzle, MD  rizatriptan (MAXALT) 10 MG tablet TAKE 1 TABLET BY MOUTH AS NEEDED FOR MIGRAINE. MAY REPEAT IN 2 HOURS IF NEEDED 04/07/22   Susy Frizzle, MD  rosuvastatin (CRESTOR) 5 MG tablet TAKE 1 TABLET BY MOUTH EVERY DAY 06/30/21   Susy Frizzle, MD    Family History Family History  Problem Relation Age of Onset   Hyperlipidemia Mother    Arrhythmia Father        A-Fib    Hypertension Father     Social History Social History   Tobacco Use   Smoking status: Never   Smokeless tobacco: Never  Vaping Use   Vaping Use: Never used  Substance Use Topics   Alcohol use: Yes    Alcohol/week: 4.0 standard drinks of alcohol    Types: 3  Cans of beer, 1 Shots of liquor per week    Comment: occasional   Drug use: No     Allergies   Morphine and related, Statins, and Zetia [ezetimibe]   Review of Systems Review of Systems   Physical Exam Triage Vital Signs ED Triage Vitals  Enc Vitals Group     BP 06/06/22 1250 (!) 163/99     Pulse Rate 06/06/22 1250 (!) 50     Resp 06/06/22 1250 16     Temp 06/06/22 1250 98.1 F (36.7 C)     Temp Source 06/06/22 1250 Oral     SpO2 06/06/22 1250 98 %     Weight --      Height --      Head Circumference --      Peak Flow --      Pain Score 06/06/22 1251 2     Pain Loc --      Pain Edu? --      Excl. in [place]? --    No data found.  Updated Vital Signs BP (!) 163/99 (BP Location: Left Arm)   Pulse (!) 50   Temp 98.1 F (36.7 C) (Oral)   Resp 16   SpO2 98%   Visual Acuity Right Eye  Distance:   Left Eye Distance:   Bilateral Distance:    Right Eye Near:   Left Eye Near:    Bilateral Near:     Physical Exam Vitals reviewed.  Constitutional:      Appearance: Normal appearance.  Neurological:     General: No focal deficit present.     Mental Status: He is alert and oriented to person, place, and time.  Psychiatric:        Mood and Affect: Mood normal.        Behavior: Behavior normal.      UC Treatments / Results  Labs (all labs ordered are listed, but only abnormal results are displayed) Labs Reviewed - No data to display  EKG   Radiology No results found.  Procedures Procedures (including critical care time)  Medications Ordered in UC Medications - No data to display  Initial Impression / Assessment and Plan / UC Course  I have reviewed the triage vital signs and the nursing notes.  Pertinent labs & imaging results that were available during my care of the patient were reviewed by me and considered in my medical decision making (see chart for details).   Patient is afebrile here without recent antipyretics. Satting well on room air. Overall is well appearing, well hydrated, without respiratory distress.   X-ray indicates no acute fracture or dislocation.  3 limited to soft tissue.  Will recommend thumb spica brace.    Final Clinical Impressions(s) / UC Diagnoses   Final diagnoses:  Hand crush injury, left, initial encounter   Discharge Instructions   None    ED Prescriptions   None    PDMP not reviewed this encounter.   Rose Phi, [place] 06/06/22 1336

## 2022-10-11 ENCOUNTER — Other Ambulatory Visit: Payer: Self-pay | Admitting: Family Medicine

## 2022-10-12 NOTE — Telephone Encounter (Signed)
Due to a system glitch the protocol does not detect the last office visit for this practice.   LOV 04/03/2022.  Requested Prescriptions  Pending Prescriptions Disp Refills   rosuvastatin (CRESTOR) 5 MG tablet [Pharmacy Med Name: ROSUVASTATIN CALCIUM 5 MG TAB] 90 tablet 1    Sig: TAKE 1 TABLET BY MOUTH EVERY DAY     Cardiovascular:  Antilipid - Statins 2 Failed - 10/11/2022 12:31 PM      Failed - Valid encounter within last 12 months    Recent Outpatient Visits           1 year ago Migraine with aura and without status migrainosus, not intractable   [facility] Pickard, Priscille Heidelberg, MD   1 year ago Prostate cancer screening   [facility] Donita Brooks, MD   3 years ago COVID-19 virus infection   [facility] Pickard, Priscille Heidelberg, MD   3 years ago Routine general medical examination at a health care facility   [facility] Pickard, Priscille Heidelberg, MD   4 years ago Pure hypercholesterolemia   [facility] Pickard, Priscille Heidelberg, MD       Future Appointments             In 5 months Pickard, Priscille Heidelberg, MD [facility], [facility]            Failed - Lipid Panel in normal range within the last 12 months    Cholesterol  Date Value Ref Range Status  03/26/2022 166 <200 mg/dL Final   LDL Cholesterol (Calc)  Date Value Ref Range Status  03/26/2022 87 mg/dL (calc) Final    Comment:    Reference range: <100 . Desirable range <100 mg/dL for primary prevention;   <70 mg/dL for patients with CHD or diabetic patients  with > or = 2 CHD risk factors. Marland Kitchen LDL-C is now calculated using the Martin-Hopkins  calculation, which is a validated novel method providing  better accuracy than the Friedewald equation in the  estimation of LDL-C.  Horald Pollen et al. Lenox Ahr. 4782;956(21): 2061-2068  (http://education.[facility].com/faq/FAQ164)    HDL  Date Value Ref Range Status  03/26/2022 64 > OR = 40  mg/dL Final   Triglycerides  Date Value Ref Range Status  03/26/2022 63 <150 mg/dL Final         Passed - Cr in normal range and within 360 days    Creat  Date Value Ref Range Status  03/26/2022 1.10 0.70 - 1.30 mg/dL Final         Passed - Patient is not pregnant

## 2022-10-20 ENCOUNTER — Other Ambulatory Visit: Payer: Self-pay | Admitting: Family Medicine

## 2022-10-21 NOTE — Telephone Encounter (Signed)
Requested Prescriptions  Pending Prescriptions Disp Refills   rizatriptan (MAXALT) 10 MG tablet [Pharmacy Med Name: RIZATRIPTAN 10 MG TABLET] 10 tablet 0    Sig: TAKE 1 TABLET BY MOUTH AS NEEDED FOR MIGRAINE. MAY REPEAT IN 2 HOURS IF NEEDED     Neurology:  Migraine Therapy - Triptan Failed - 10/20/2022  7:36 PM      Failed - Last BP in normal range    BP Readings from Last 1 Encounters:  06/06/22 (!) 163/99         Failed - Valid encounter within last 12 months    Recent Outpatient Visits           1 year ago Migraine with aura and without status migrainosus, not intractable   [facility] Pickard, Priscille Heidelberg, MD   1 year ago Prostate cancer screening   [facility] Donita Brooks, MD   3 years ago COVID-19 virus infection   [facility] Pickard, Priscille Heidelberg, MD   3 years ago Routine general medical examination at a health care facility   [facility] Pickard, Priscille Heidelberg, MD   4 years ago Pure hypercholesterolemia   [facility] Pickard, Priscille Heidelberg, MD       Future Appointments             In 5 months Pickard, Priscille Heidelberg, MD [facility], [facility]

## 2022-10-25 ENCOUNTER — Other Ambulatory Visit: Payer: Self-pay | Admitting: Family Medicine

## 2022-11-22 ENCOUNTER — Other Ambulatory Visit: Payer: Self-pay | Admitting: Family Medicine

## 2022-11-24 NOTE — Telephone Encounter (Signed)
Requested Prescriptions  Pending Prescriptions Disp Refills   rizatriptan (MAXALT) 10 MG tablet [Pharmacy Med Name: RIZATRIPTAN 10 MG TABLET] 10 tablet 1    Sig: TAKE 1 TABLET BY MOUTH AS NEEDED FOR MIGRAINE. MAY REPEAT IN 2 HOURS IF NEEDED     Neurology:  Migraine Therapy - Triptan Failed - 11/22/2022  8:43 AM      Failed - Last BP in normal range    BP Readings from Last 1 Encounters:  06/06/22 (!) 163/99         Failed - Valid encounter within last 12 months    Recent Outpatient Visits           1 year ago Migraine with aura and without status migrainosus, not intractable   [facility] Pickard, Priscille Heidelberg, MD   1 year ago Prostate cancer screening   [facility] Donita Brooks, MD   3 years ago COVID-19 virus infection   [facility] Pickard, Priscille Heidelberg, MD   3 years ago Routine general medical examination at a health care facility   [facility] Pickard, Priscille Heidelberg, MD   4 years ago Pure hypercholesterolemia   [facility] Pickard, Priscille Heidelberg, MD       Future Appointments             In 4 months Pickard, Priscille Heidelberg, MD [facility]

## 2022-12-18 ENCOUNTER — Other Ambulatory Visit: Payer: Self-pay | Admitting: Family Medicine

## 2022-12-18 NOTE — Telephone Encounter (Signed)
Requested Prescriptions  Pending Prescriptions Disp Refills   PARoxetine (PAXIL) 20 MG tablet [Pharmacy Med Name: PAROXETINE HCL 20 MG TABLET] 90 tablet 0    Sig: TAKE 1 TABLET BY MOUTH EVERY DAY     Psychiatry:  Antidepressants - SSRI Failed - 12/18/2022  9:31 AM      Failed - Valid encounter within last 6 months    Recent Outpatient Visits           1 year ago Migraine with aura and without status migrainosus, not intractable   [facility] Pickard, Priscille Heidelberg, MD   1 year ago Prostate cancer screening   [facility] Donita Brooks, MD   3 years ago COVID-19 virus infection   [facility] Medicine Pickard, Priscille Heidelberg, MD   3 years ago Routine general medical examination at a health care facility   [facility] Medicine Pickard, Priscille Heidelberg, MD   4 years ago Pure hypercholesterolemia   [facility] Pickard, Priscille Heidelberg, MD       Future Appointments             In 3 months Pickard, Priscille Heidelberg, MD [facility], [facility]

## 2023-03-23 ENCOUNTER — Telehealth: Payer: Self-pay

## 2023-03-23 ENCOUNTER — Other Ambulatory Visit: Payer: Self-pay | Admitting: Family Medicine

## 2023-03-23 MED ORDER — PAROXETINE HCL 20 MG PO TABS: 20.0000 mg | ORAL_TABLET | Freq: Every day | ORAL | 0 refills | Status: DC

## 2023-03-23 NOTE — Telephone Encounter (Signed)
Prescription Request  03/23/2023  LOV: 04/03/22 UPCOMING CPE 04/05/23  What is the name of the medication or equipment? PARoxetine (PAXIL) 20 MG tablet [[id number]]   Have you contacted your pharmacy to request a refill? Yes   Which pharmacy would you like this sent to?  [id number] [address] [address], [place] - [address] [place] [address] Phone: [phone] Fax: [phone]    Patient notified that their request is being sent to the clinical staff for review and that they should receive a response within 2 business days.   Please advise at [facility] [phone]

## 2023-03-29 ENCOUNTER — Other Ambulatory Visit: Payer: 59

## 2023-03-29 DIAGNOSIS — Z125 Encounter for screening for malignant neoplasm of prostate: Secondary | ICD-10-CM

## 2023-03-29 DIAGNOSIS — R931 Abnormal findings on diagnostic imaging of heart and coronary circulation: Secondary | ICD-10-CM

## 2023-03-29 DIAGNOSIS — I10 Essential (primary) hypertension: Secondary | ICD-10-CM

## 2023-03-29 DIAGNOSIS — E7800 Pure hypercholesterolemia, unspecified: Secondary | ICD-10-CM

## 2023-03-30 LAB — COMPLETE METABOLIC PANEL WITH GFR
AG Ratio: 2.3 (calc) (ref 1.0–2.5)
ALT: 28 U/L (ref 9–46)
AST: 26 U/L (ref 10–35)
Albumin: 4.4 g/dL (ref 3.6–5.1)
Alkaline phosphatase (APISO): 40 U/L (ref 35–144)
BUN: 13 mg/dL (ref 7–25)
CO2: 29 mmol/L (ref 20–32)
Calcium: 9.2 mg/dL (ref 8.6–10.3)
Chloride: 104 mmol/L (ref 98–110)
Creat: 1.09 mg/dL (ref 0.70–1.30)
Globulin: 1.9 g/dL (ref 1.9–3.7)
Glucose, Bld: 93 mg/dL (ref 65–99)
Potassium: 4.8 mmol/L (ref 3.5–5.3)
Sodium: 140 mmol/L (ref 135–146)
Total Bilirubin: 0.7 mg/dL (ref 0.2–1.2)
Total Protein: 6.3 g/dL (ref 6.1–8.1)
eGFR: 78 mL/min/{1.73_m2} (ref >=60)

## 2023-03-30 LAB — LIPID PANEL
Cholesterol: 223 mg/dL — ABNORMAL HIGH (ref <200)
HDL: 73 mg/dL (ref >=40)
LDL Cholesterol (Calc): 126 mg/dL — ABNORMAL HIGH
Non-HDL Cholesterol (Calc): 150 mg/dL — ABNORMAL HIGH (ref <130)
Total CHOL/HDL Ratio: 3.1 (calc) (ref <5.0)
Triglycerides: 128 mg/dL (ref <150)

## 2023-03-30 LAB — CBC WITH DIFFERENTIAL/PLATELET
Absolute Lymphocytes: 1518 {cells}/uL (ref 850–3900)
Absolute Monocytes: 488 {cells}/uL (ref 200–950)
Basophils Absolute: 48 {cells}/uL (ref 0–200)
Basophils Relative: 1.1 %
Eosinophils Absolute: 378 {cells}/uL (ref 15–500)
Eosinophils Relative: 8.6 %
HCT: 45.8 % (ref 38.5–50.0)
Hemoglobin: 14.9 g/dL (ref 13.2–17.1)
MCH: 31.1 pg (ref 27.0–33.0)
MCHC: 32.5 g/dL (ref 32.0–36.0)
MCV: 95.6 fL (ref 80.0–100.0)
MPV: 10.8 fL (ref 7.5–12.5)
Monocytes Relative: 11.1 %
Neutro Abs: 1967 {cells}/uL (ref 1500–7800)
Neutrophils Relative %: 44.7 %
Platelets: 249 10*3/uL (ref 140–400)
RBC: 4.79 10*6/uL (ref 4.20–5.80)
RDW: 12 % (ref 11.0–15.0)
Total Lymphocyte: 34.5 %
WBC: 4.4 10*3/uL (ref 3.8–10.8)

## 2023-03-30 LAB — PSA: PSA: 0.25 ng/mL (ref <=4.00)

## 2023-04-05 ENCOUNTER — Ambulatory Visit (INDEPENDENT_AMBULATORY_CARE_PROVIDER_SITE_OTHER): Payer: 59 | Admitting: Family Medicine

## 2023-04-05 ENCOUNTER — Encounter: Payer: Self-pay | Admitting: Family Medicine

## 2023-04-05 VITALS — BP 152/100 | HR 55 | Temp 98.1°F | Ht 73.0 in | Wt 214.2 lb

## 2023-04-05 DIAGNOSIS — E7800 Pure hypercholesterolemia, unspecified: Secondary | ICD-10-CM

## 2023-04-05 DIAGNOSIS — R931 Abnormal findings on diagnostic imaging of heart and coronary circulation: Secondary | ICD-10-CM | POA: Diagnosis not present

## 2023-04-05 DIAGNOSIS — Z0001 Encounter for general adult medical examination with abnormal findings: Secondary | ICD-10-CM | POA: Diagnosis not present

## 2023-04-05 DIAGNOSIS — Z0000 Encounter for general adult medical examination without abnormal findings: Secondary | ICD-10-CM

## 2023-04-05 NOTE — Progress Notes (Signed)
Subjective:    Patient ID: Darryl Ross, male    DOB: 1965/10/08, 57 y.o.   MRN: [id number]  HPI Patient is here for complete physical exam.  Patient had a colonoscopy in 2016 which showed microscopic colitis but was otherwise normal.  He is due for repeat colonoscopy in 2026.  Patient also has some mild plaque seen in his LAD on a coronary artery calcium score of 2022.  He takes Crestor 3 days a week.  He has been hesitant to take it daily due to a history of elevated liver function test.  However his liver function test today are completely normal.  His LDL cholesterol has risen substantially.  Patient had his flu shot is changed.  He is due for tetanus shot next year.  He is due for a COVID shot now.  He politely declines a COVID shot. Immunization History  Administered Date(s) Administered   DTaP 03/06/2009   Influenza, Quadrivalent, Recombinant, Inj, Pf 03/11/2021   Influenza, Seasonal, Injecte, Preservative Fre 03/04/2020   Influenza-Unspecified 02/03/2016, 01/23/2017, 12/31/2017, 01/04/2019   PFIZER(Purple Top)SARS-COV-2 Vaccination 06/24/2019, 07/18/2019   Tdap 11/04/2013    Lab on 03/29/2023  Component Date Value Ref Range Status   WBC 03/29/2023 4.4  3.8 - 10.8 Thousand/uL Final   RBC 03/29/2023 4.79  4.20 - 5.80 Million/uL Final   Hemoglobin 03/29/2023 14.9  13.2 - 17.1 g/dL Final   HCT 13/24/4010 45.8  38.5 - 50.0 % Final   MCV 03/29/2023 95.6  80.0 - 100.0 fL Final   MCH 03/29/2023 31.1  27.0 - 33.0 pg Final   MCHC 03/29/2023 32.5  32.0 - 36.0 g/dL Final   Comment: For adults, a slight decrease in the calculated MCHC value (in the range of 30 to 32 g/dL) is most likely not clinically significant; however, it should be interpreted with caution in correlation with other red cell parameters and the patient's clinical condition.    RDW 03/29/2023 12.0  11.0 - 15.0 % Final   Platelets 03/29/2023 249  140 - 400 Thousand/uL Final   MPV 03/29/2023 10.8  7.5 - 12.5 fL Final    Neutro Abs 03/29/2023 1,967  1,500 - 7,800 cells/uL Final   Absolute Lymphocytes 03/29/2023 1,518  850 - 3,900 cells/uL Final   Absolute Monocytes 03/29/2023 488  200 - 950 cells/uL Final   Eosinophils Absolute 03/29/2023 378  15 - 500 cells/uL Final   Basophils Absolute 03/29/2023 48  0 - 200 cells/uL Final   Neutrophils Relative % 03/29/2023 44.7  % Final   Total Lymphocyte 03/29/2023 34.5  % Final   Monocytes Relative 03/29/2023 11.1  % Final   Eosinophils Relative 03/29/2023 8.6  % Final   Basophils Relative 03/29/2023 1.1  % Final   Glucose, Bld 03/29/2023 93  65 - 99 mg/dL Final   Comment: .            Fasting reference interval .    BUN 03/29/2023 13  7 - 25 mg/dL Final   Creat 27/25/3664 1.09  0.70 - 1.30 mg/dL Final   eGFR 40/34/7425 78  > OR = 60 mL/min/1.21m2 Final   BUN/Creatinine Ratio 03/29/2023 SEE NOTE:  6 - 22 (calc) Final   Comment:    Not Reported: BUN and Creatinine are within    reference range. .    Sodium 03/29/2023 140  135 - 146 mmol/L Final   Potassium 03/29/2023 4.8  3.5 - 5.3 mmol/L Final   Chloride 03/29/2023 104  98 -  110 mmol/L Final   CO2 03/29/2023 29  20 - 32 mmol/L Final   Calcium 03/29/2023 9.2  8.6 - 10.3 mg/dL Final   Total Protein 13/11/6576 6.3  6.1 - 8.1 g/dL Final   Albumin 46/96/2952 4.4  3.6 - 5.1 g/dL Final   Globulin 84/13/2440 1.9  1.9 - 3.7 g/dL (calc) Final   AG Ratio 03/29/2023 2.3  1.0 - 2.5 (calc) Final   Total Bilirubin 03/29/2023 0.7  0.2 - 1.2 mg/dL Final   Alkaline phosphatase (APISO) 03/29/2023 40  35 - 144 U/L Final   AST 03/29/2023 26  10 - 35 U/L Final   ALT 03/29/2023 28  9 - 46 U/L Final   Cholesterol 03/29/2023 223 (H)  <200 mg/dL Final   HDL 01/31/2535 73  > OR = 40 mg/dL Final   Triglycerides 64/40/3474 128  <150 mg/dL Final   LDL Cholesterol (Calc) 03/29/2023 126 (H)  mg/dL (calc) Final   Comment: Reference range: <100 . Desirable range <100 mg/dL for primary prevention;   <70 mg/dL for patients with CHD  or diabetic patients  with > or = 2 CHD risk factors. Marland Kitchen LDL-C is now calculated using the Martin-Hopkins  calculation, which is a validated novel method providing  better accuracy than the Friedewald equation in the  estimation of LDL-C.  Horald Pollen et al. Lenox Ahr. 2595;638(75): 2061-2068  (http://education.[employer].com/faq/FAQ164)    Total CHOL/HDL Ratio 03/29/2023 3.1  <6.4 (calc) Final   Non-HDL Cholesterol (Calc) 03/29/2023 150 (H)  <130 mg/dL (calc) Final   Comment: For patients with diabetes plus 1 major ASCVD risk  factor, treating to a non-HDL-C goal of <100 mg/dL  (LDL-C of <33 mg/dL) is considered a therapeutic  option.    PSA 03/29/2023 0.25  < OR = 4.00 ng/mL Final   Comment: The total PSA value from this assay system is  standardized against the WHO standard. The test  result will be approximately 20% lower when compared  to the equimolar-standardized total PSA (Beckman  Coulter). Comparison of serial PSA results should be  interpreted with this fact in mind. . This test was performed using the Siemens  chemiluminescent method. Values obtained from  different assay methods cannot be used interchangeably. PSA levels, regardless of value, should not be interpreted as absolute evidence of the presence or absence of disease.     Past Medical History:  Diagnosis Date   Anxiety    Arthritis    hands, neck   Degenerative disc disease, cervical    no limitations   Hypercholesteremia    Hyperlipidemia    Hypertension    neg stress test several yrs ago   Lymphocytic colitis    Past Surgical History:  Procedure Laterality Date   COLONOSCOPY WITH PROPOFOL N/A 03/04/2015   Procedure: COLONOSCOPY WITH PROPOFOL;  Surgeon: Midge Minium, MD;  Location[facility];  Service: Endoscopy;  Laterality: N/A;   HAND SURGERY     fracture repair   Current Outpatient Medications on File Prior to Visit  Medication Sig Dispense Refill   ALPRAZolam (XANAX) 0.5 MG  tablet Take 1 tablet (0.5 mg total) by mouth at bedtime as needed. 30 tablet 0   aspirin 81 MG tablet Take 81 mg by mouth daily.     budesonide (ENTOCORT EC) 3 MG 24 hr capsule TAKE 3 CAPSULES (9 MG TOTAL) BY MOUTH EVERY MORNING. (Patient taking differently: Take 9 mg by mouth as needed.) 90 capsule 1   Glucosamine-Chondroitin (OSTEO BI-FLEX REGULAR STRENGTH PO)  Take 2 tablets by mouth daily.     metoprolol succinate (TOPROL-XL) 25 MG 24 hr tablet TAKE 1 TABLET BY MOUTH EVERY DAY 90 tablet 3   Multiple Vitamin (MULTIVITAMIN) tablet Take 1 tablet by mouth daily.     Omega-3 Fatty Acids (FISH OIL) 1000 MG CAPS Take 2,000 mg by mouth daily.     PARoxetine (PAXIL) 20 MG tablet Take 1 tablet (20 mg total) by mouth daily. 90 tablet 0   rizatriptan (MAXALT) 10 MG tablet TAKE 1 TABLET BY MOUTH AS NEEDED FOR MIGRAINE. MAY REPEAT IN 2 HOURS IF NEEDED 10 tablet 1   rosuvastatin (CRESTOR) 5 MG tablet TAKE 1 TABLET BY MOUTH EVERY DAY 90 tablet 1   No current facility-administered medications on file prior to visit.   Allergies  Allergen Reactions   Morphine And Codeine Nausea Only   Statins Other (See Comments)    Effected liver enzymes   Zetia [Ezetimibe]     Elevated LFT's   Social History   Socioeconomic History   Marital status: Married    Spouse name: Not on file   Number of children: Not on file   Years of education: Not on file   Highest education level: Not on file  Occupational History   Not on file  Tobacco Use   Smoking status: Never   Smokeless tobacco: Never  Vaping Use   Vaping status: Never Used  Substance and Sexual Activity   Alcohol use: Yes    Alcohol/week: 4.0 standard drinks of alcohol    Types: 3 Cans of beer, 1 Shots of liquor per week    Comment: occasional   Drug use: No   Sexual activity: Not on file    Comment: avid runner and cyclist.  Married.  Other Topics Concern   Not on file  Social History Narrative   Not on file   Social Drivers of Health    Financial Resource Strain: Not on file  Food Insecurity: Not on file  Transportation Needs: Not on file  Physical Activity: Not on file  Stress: Not on file  Social Connections: Not on file  Intimate Partner Violence: Not on file   Family History  Problem Relation Age of Onset   Hyperlipidemia Mother    Arrhythmia Father        A-Fib    Hypertension Father       Review of Systems  All other systems reviewed and are negative.      Objective:   Physical Exam Vitals reviewed.  Constitutional:      General: He is not in acute distress.    Appearance: He is well-developed. He is not diaphoretic.  HENT:     Head: Normocephalic and atraumatic.     Right Ear: External ear normal.     Left Ear: External ear normal.     Nose: Nose normal.     Mouth/Throat:     Pharynx: No oropharyngeal exudate.  Eyes:     General: No scleral icterus.       Right eye: No discharge.        Left eye: No discharge.     Conjunctiva/sclera: Conjunctivae normal.     Pupils: Pupils are equal, round, and reactive to light.  Neck:     Thyroid: No thyromegaly.     Vascular: No JVD.     Trachea: No tracheal deviation.  Cardiovascular:     Rate and Rhythm: Normal rate and regular rhythm.     Heart  sounds: Normal heart sounds. No murmur heard.    No friction rub. No gallop.  Pulmonary:     Effort: Pulmonary effort is normal. No respiratory distress.     Breath sounds: Normal breath sounds. No stridor. No wheezing or rales.  Chest:     Chest wall: No tenderness.  Abdominal:     General: Bowel sounds are normal. There is no distension.     Palpations: Abdomen is soft. There is no mass.     Tenderness: There is no abdominal tenderness. There is no guarding or rebound.  Musculoskeletal:        General: No tenderness. Normal range of motion.     Cervical back: Normal range of motion and neck supple.  Lymphadenopathy:     Cervical: No cervical adenopathy.  Skin:    General: Skin is warm.      Coloration: Skin is not pale.     Findings: No erythema or rash.  Neurological:     Mental Status: He is alert and oriented to person, place, and time.     Cranial Nerves: No cranial nerve deficit.     Motor: No abnormal muscle tone.     Coordination: Coordination normal.     Deep Tendon Reflexes: Reflexes are normal and symmetric.  Psychiatric:        Behavior: Behavior normal.        Thought Content: Thought content normal.        Judgment: Judgment normal.           Assessment & Plan:  General medical exam  Pure hypercholesterolemia  Elevated coronary artery calcium score Patient politely declines a tetanus shot.  His PSA is excellent.  His colonoscopy is due in 2026.  His review of systems is negative.  Patient believes that his blood pressure is fictitiously elevated.  He states that he checks this daily at home and it is consistently less than 140/90.  He will continue to monitor this at home and let me know if his blood pressure runs higher than 140/90.  He agrees to increase the Crestor to a daily medication and resting lipid panel in 3 months.

## 2023-04-24 ENCOUNTER — Other Ambulatory Visit: Payer: Self-pay | Admitting: Family Medicine

## 2023-06-10 ENCOUNTER — Encounter: Payer: Self-pay | Admitting: Family Medicine

## 2023-06-15 ENCOUNTER — Other Ambulatory Visit: Payer: Self-pay | Admitting: Family Medicine

## 2023-06-15 MED ORDER — MECLIZINE HCL 25 MG PO TABS: 25.0000 mg | ORAL_TABLET | Freq: Three times a day (TID) | ORAL | 0 refills | Status: DC | PRN

## 2023-06-15 NOTE — Telephone Encounter (Signed)
 Requested medication (s) are due for refill today: No  Requested medication (s) are on the active medication list: Yes  Last refill:  06/15/23  Future visit scheduled: Yes  Notes to clinic:  Unable to refill per protocol due to pharmacy request for an alternative medication.      Requested Prescriptions  Pending Prescriptions Disp Refills   promethazine (PHENERGAN) 25 MG tablet [Pharmacy Med Name: PROMETHAZINE 25 MG TABLET] 1 tablet 0     Not Delegated - Gastroenterology: Antiemetics Failed - 06/15/2023  4:11 PM      Failed - This refill cannot be delegated      Failed - Valid encounter within last 6 months    Recent Outpatient Visits           1 year ago Migraine with aura and without status migrainosus, not intractable   [facility] Medicine Pickard, Priscille Heidelberg, MD   2 years ago Prostate cancer screening   [facility] Donita Brooks, MD   4 years ago COVID-19 virus infection   [facility] Medicine Pickard, Priscille Heidelberg, MD   4 years ago Routine general medical examination at a health care facility   [facility] Medicine Pickard, Priscille Heidelberg, MD   5 years ago Pure hypercholesterolemia   [facility] Pickard, Priscille Heidelberg, MD

## 2023-06-17 ENCOUNTER — Other Ambulatory Visit: Payer: Self-pay | Admitting: Family Medicine

## 2023-06-18 NOTE — Telephone Encounter (Signed)
 Requested medication (s) are due for refill today: no  Requested medication (s) are on the active medication list: yes  Last refill:  06/18/23  Future visit scheduled: no  Notes to clinic:  Pharmacy comment: Script Clarification:PLS VERIFY QUANTITY BEING PRESCRIBED AND RESEND RX.      Requested Prescriptions  Pending Prescriptions Disp Refills   promethazine (PHENERGAN) 25 MG tablet [Pharmacy Med Name: PROMETHAZINE 25 MG TABLET] 1 tablet 0    Sig: TAKE 1 TABLET BY MOUTH EVERY 6 HOURS AS NEEDED FOR NAUSEA OR VOMITING.     Not Delegated - Gastroenterology: Antiemetics Failed - 06/18/2023 11:02 AM      Failed - This refill cannot be delegated      Failed - Valid encounter within last 6 months    Recent Outpatient Visits           1 year ago Migraine with aura and without status migrainosus, not intractable   [facility] Pickard, Priscille Heidelberg, MD   2 years ago Prostate cancer screening   [facility] Medicine Donita Brooks, MD   4 years ago COVID-19 virus infection   [facility] Medicine Pickard, Priscille Heidelberg, MD   4 years ago Routine general medical examination at a health care facility   [facility] Medicine Pickard, Priscille Heidelberg, MD   5 years ago Pure hypercholesterolemia   [facility] Pickard, Priscille Heidelberg, MD

## 2023-06-20 ENCOUNTER — Other Ambulatory Visit: Payer: Self-pay | Admitting: Family Medicine

## 2023-06-22 NOTE — Telephone Encounter (Signed)
 Requested Prescriptions  Pending Prescriptions Disp Refills   metoprolol succinate (TOPROL-XL) 25 MG 24 hr tablet [Pharmacy Med Name: METOPROLOL SUCC ER 25 MG TAB] 90 tablet 0    Sig: TAKE 1 TABLET BY MOUTH EVERY DAY     Cardiovascular:  Beta Blockers Failed - 06/22/2023  9:17 AM      Failed - Last BP in normal range    BP Readings from Last 1 Encounters:  04/05/23 (!) 152/100         Failed - Valid encounter within last 6 months    Recent Outpatient Visits           1 year ago Migraine with aura and without status migrainosus, not intractable   [facility] Medicine Pickard, Priscille Heidelberg, MD   2 years ago Prostate cancer screening   [facility] Donita Brooks, MD   4 years ago COVID-19 virus infection   [facility] Medicine Pickard, Priscille Heidelberg, MD   4 years ago Routine general medical examination at a health care facility   [facility] Medicine Pickard, Priscille Heidelberg, MD   5 years ago Pure hypercholesterolemia   [facility] Tanya Nones, Priscille Heidelberg, MD              Passed - Last Heart Rate in normal range    Pulse Readings from Last 1 Encounters:  04/05/23 (!) 55          PARoxetine (PAXIL) 20 MG tablet [Pharmacy Med Name: PAROXETINE HCL 20 MG TABLET] 90 tablet 0    Sig: TAKE 1 TABLET BY MOUTH EVERY DAY     Psychiatry:  Antidepressants - SSRI Failed - 06/22/2023  9:17 AM      Failed - Valid encounter within last 6 months    Recent Outpatient Visits           1 year ago Migraine with aura and without status migrainosus, not intractable   [facility] Medicine Pickard, Priscille Heidelberg, MD   2 years ago Prostate cancer screening   [facility] Donita Brooks, MD   4 years ago COVID-19 virus infection   [facility] Medicine Pickard, Priscille Heidelberg, MD   4 years ago Routine general medical examination at a health care facility   [facility] Medicine Pickard, Priscille Heidelberg, MD   5 years ago Pure  hypercholesterolemia   [facility] Pickard, Priscille Heidelberg, MD

## 2023-07-05 ENCOUNTER — Other Ambulatory Visit: Payer: 59

## 2023-07-05 DIAGNOSIS — I10 Essential (primary) hypertension: Secondary | ICD-10-CM

## 2023-07-05 DIAGNOSIS — Z125 Encounter for screening for malignant neoplasm of prostate: Secondary | ICD-10-CM

## 2023-07-05 DIAGNOSIS — R931 Abnormal findings on diagnostic imaging of heart and coronary circulation: Secondary | ICD-10-CM

## 2023-07-05 DIAGNOSIS — E7800 Pure hypercholesterolemia, unspecified: Secondary | ICD-10-CM

## 2023-07-06 LAB — COMPLETE METABOLIC PANEL WITHOUT GFR
AG Ratio: 2.9 (calc) — ABNORMAL HIGH (ref 1.0–2.5)
ALT: 19 U/L (ref 9–46)
AST: 19 U/L (ref 10–35)
Albumin: 4.4 g/dL (ref 3.6–5.1)
Alkaline phosphatase (APISO): 40 U/L (ref 35–144)
BUN: 12 mg/dL (ref 7–25)
CO2: 28 mmol/L (ref 20–32)
Calcium: 9.2 mg/dL (ref 8.6–10.3)
Chloride: 104 mmol/L (ref 98–110)
Creat: 1.06 mg/dL (ref 0.70–1.30)
Globulin: 1.5 g/dL — ABNORMAL LOW (ref 1.9–3.7)
Glucose, Bld: 96 mg/dL (ref 65–99)
Potassium: 4.7 mmol/L (ref 3.5–5.3)
Sodium: 140 mmol/L (ref 135–146)
Total Bilirubin: 0.5 mg/dL (ref 0.2–1.2)
Total Protein: 5.9 g/dL — ABNORMAL LOW (ref 6.1–8.1)

## 2023-07-06 LAB — CBC WITH DIFFERENTIAL/PLATELET
Absolute Lymphocytes: 1390 {cells}/uL (ref 850–3900)
Absolute Monocytes: 565 {cells}/uL (ref 200–950)
Basophils Absolute: 50 {cells}/uL (ref 0–200)
Basophils Relative: 1 %
Eosinophils Absolute: 400 {cells}/uL (ref 15–500)
Eosinophils Relative: 8 %
HCT: 45 % (ref 38.5–50.0)
Hemoglobin: 14.6 g/dL (ref 13.2–17.1)
MCH: 31.1 pg (ref 27.0–33.0)
MCHC: 32.4 g/dL (ref 32.0–36.0)
MCV: 95.7 fL (ref 80.0–100.0)
MPV: 10.6 fL (ref 7.5–12.5)
Monocytes Relative: 11.3 %
Neutro Abs: 2595 {cells}/uL (ref 1500–7800)
Neutrophils Relative %: 51.9 %
Platelets: 237 10*3/uL (ref 140–400)
RBC: 4.7 10*6/uL (ref 4.20–5.80)
RDW: 12 % (ref 11.0–15.0)
Total Lymphocyte: 27.8 %
WBC: 5 10*3/uL (ref 3.8–10.8)

## 2023-07-06 LAB — LIPID PANEL
Cholesterol: 155 mg/dL (ref <200)
HDL: 62 mg/dL (ref >=40)
LDL Cholesterol (Calc): 75 mg/dL
Non-HDL Cholesterol (Calc): 93 mg/dL (ref <130)
Total CHOL/HDL Ratio: 2.5 (calc) (ref <5.0)
Triglycerides: 92 mg/dL (ref <150)

## 2023-07-06 LAB — PSA: PSA: 0.23 ng/mL (ref <=4.00)

## 2023-07-20 ENCOUNTER — Ambulatory Visit: Admitting: Family Medicine

## 2023-07-20 ENCOUNTER — Other Ambulatory Visit: Payer: Self-pay

## 2023-07-20 ENCOUNTER — Encounter: Payer: Self-pay | Admitting: Family Medicine

## 2023-07-20 VITALS — BP 130/72 | HR 52 | Temp 97.7°F | Ht 73.0 in | Wt 205.8 lb

## 2023-07-20 DIAGNOSIS — M542 Cervicalgia: Secondary | ICD-10-CM

## 2023-07-20 DIAGNOSIS — M5030 Other cervical disc degeneration, unspecified cervical region: Secondary | ICD-10-CM

## 2023-07-20 DIAGNOSIS — M5412 Radiculopathy, cervical region: Secondary | ICD-10-CM | POA: Diagnosis not present

## 2023-07-20 DIAGNOSIS — I10 Essential (primary) hypertension: Secondary | ICD-10-CM

## 2023-07-20 MED ORDER — VALSARTAN 160 MG PO TABS
160.0000 mg | ORAL_TABLET | Freq: Every day | ORAL | 3 refills | Status: DC
Start: 2023-07-20 — End: 2023-11-05

## 2023-07-20 NOTE — Progress Notes (Signed)
 Subjective:    Patient ID: Darryl Ross, male    DOB: Aug 05, 1965, 58 y.o.   MRN: [id number]  Neck Pain    Patient has a history of chronic neck pain.  On an x-ray as well as an MRI obtained in 2019, the patient was found to have degenerative disc disease progressed at C6-C7 with loss of bilateral neural foraminal stenosis.  Patient tried epidural steroid injections for this.  However he received no benefit from the injection.  He also tried physical therapy.  He has been dealing with the degenerative disc disease for several years.  However recently it has worsened.  He now has markedly diminished range of motion.  He states that he is having a difficult time driving a car.  It hurts for him to turn his head side-to-side.  Specifically the pain is located on the right side of the neck into the right trapezius.  The pain radiates from his neck into his right shoulder.  He also has the pain radiating from his neck up into his scalp and sometimes leaves her headaches.  He denies any arm weakness.  He denies any right arm numbness.  He is not losing any muscle strength in his right arm.  He has normal 2/4 reflexes at the biceps, brachioradialis, and triceps.  He has normal sensation and normal grip strength. Past Medical History:  Diagnosis Date   Anxiety    Arthritis    hands, neck   Degenerative disc disease, cervical    no limitations   Hypercholesteremia    Hyperlipidemia    Hypertension    neg stress test several yrs ago   Lymphocytic colitis    Past Surgical History:  Procedure Laterality Date   COLONOSCOPY WITH PROPOFOL N/A 03/04/2015   Procedure: COLONOSCOPY WITH PROPOFOL;  Surgeon: Midge Minium, MD;  Location: [facility];  Service: Endoscopy;  Laterality: N/A;   HAND SURGERY     fracture repair   Current Outpatient Medications on File Prior to Visit  Medication Sig Dispense Refill   ALPRAZolam (XANAX) 0.5 MG tablet Take 1 tablet (0.5 mg total) by mouth at bedtime as  needed. 30 tablet 0   aspirin 81 MG tablet Take 81 mg by mouth daily.     budesonide (ENTOCORT EC) 3 MG 24 hr capsule TAKE 3 CAPSULES (9 MG TOTAL) BY MOUTH EVERY MORNING. (Patient taking differently: Take 9 mg by mouth as needed.) 90 capsule 1   Glucosamine-Chondroitin (OSTEO BI-FLEX REGULAR STRENGTH PO) Take 2 tablets by mouth daily.     metoprolol succinate (TOPROL-XL) 25 MG 24 hr tablet TAKE 1 TABLET BY MOUTH EVERY DAY 90 tablet 0   Multiple Vitamin (MULTIVITAMIN) tablet Take 1 tablet by mouth daily.     Omega-3 Fatty Acids (FISH OIL) 1000 MG CAPS Take 2,000 mg by mouth daily.     PARoxetine (PAXIL) 20 MG tablet TAKE 1 TABLET BY MOUTH EVERY DAY 90 tablet 0   promethazine (PHENERGAN) 25 MG tablet TAKE 1 TABLET BY MOUTH EVERY 6 HOURS AS NEEDED FOR NAUSEA OR VOMITING. 1 tablet 0   rizatriptan (MAXALT) 10 MG tablet TAKE 1 TABLET BY MOUTH AS NEEDED FOR MIGRAINE. MAY REPEAT IN 2 HOURS IF NEEDED 10 tablet 1   rosuvastatin (CRESTOR) 5 MG tablet TAKE 1 TABLET BY MOUTH EVERY DAY 90 tablet 1   No current facility-administered medications on file prior to visit.   Allergies  Allergen Reactions   Morphine And Codeine Nausea Only   Statins  Other (See Comments)    Effected liver enzymes   Zetia [Ezetimibe]     Elevated LFT's   Social History   Socioeconomic History   Marital status: Married    Spouse name: Not on file   Number of children: Not on file   Years of education: Not on file   Highest education level: 12th grade  Occupational History   Not on file  Tobacco Use   Smoking status: Never   Smokeless tobacco: Never  Vaping Use   Vaping status: Never Used  Substance and Sexual Activity   Alcohol use: Yes    Alcohol/week: 4.0 standard drinks of alcohol    Types: 3 Cans of beer, 1 Shots of liquor per week    Comment: occasional   Drug use: No   Sexual activity: Not on file    Comment: avid runner and cyclist.  Married.  Other Topics Concern   Not on file  Social History Narrative    Not on file   Social Drivers of Health   Financial Resource Strain: Low Risk  (07/16/2023)   Overall Financial Resource Strain (CARDIA)    Difficulty of Paying Living Expenses: Not hard at all  Food Insecurity: No Food Insecurity (07/16/2023)   Hunger Vital Sign    Worried About Running Out of Food in the Last Year: Never true    Ran Out of Food in the Last Year: Never true  Transportation Needs: No Transportation Needs (07/16/2023)   PRAPARE - Administrator, Civil Service (Medical): No    Lack of Transportation (Non-Medical): No  Physical Activity: Sufficiently Active (07/16/2023)   Exercise Vital Sign    Days of Exercise per Week: 3 days    Minutes of Exercise per Session: 120 min  Stress: No Stress Concern Present (07/16/2023)   [employer] of Occupational Health - Occupational Stress Questionnaire    Feeling of Stress : Only a little  Social Connections: Socially Integrated (07/16/2023)   Social Connection and Isolation Panel [NHANES]    Frequency of Communication with Friends and Family: Three times a week    Frequency of Social Gatherings with Friends and Family: Once a week    Attends Religious Services: More than 4 times per year    Active Member of [employer] or Organizations: Yes    Attends Engineer, structural: More than 4 times per year    Marital Status: Married  Catering manager Violence: Not on file      Review of Systems  Musculoskeletal:  Positive for neck pain.  All other systems reviewed and are negative.      Objective:   Physical Exam Vitals reviewed.  Neck:   Cardiovascular:     Rate and Rhythm: Normal rate and regular rhythm.     Heart sounds: Normal heart sounds. No murmur heard. Pulmonary:     Effort: Pulmonary effort is normal. No respiratory distress.     Breath sounds: Normal breath sounds. No stridor. No wheezing or rales.  Musculoskeletal:     Cervical back: Tenderness present. No swelling, edema, deformity, signs  of trauma, spasms or bony tenderness. Pain with movement and muscular tenderness present. No spinous process tenderness. Decreased range of motion.  Neurological:     Mental Status: He is alert and oriented to person, place, and time.     Cranial Nerves: No cranial nerve deficit.     Sensory: No sensory deficit.     Motor: No abnormal muscle tone.  Coordination: Coordination normal.     Deep Tendon Reflexes: Reflexes normal.     Reflex Scores:      Tricep reflexes are 2+ on the right side and 2+ on the left side.      Bicep reflexes are 2+ on the right side and 2+ on the left side.      Brachioradialis reflexes are 2+ on the right side and 2+ on the left side.          Assessment & Plan:  DDD (degenerative disc disease), cervical  Cervical radiculopathy at C6 Patient has a history of chronic neck pain due to degenerative disc disease.  He has been taking NSAIDs such as Advil on a daily basis with no benefit.  In the past he has tried physical therapy.  He is also tried epidural steroid injections without benefit.  His range of motion is getting worse.  The pain in his neck is getting worse.  Now he is having pain radiating into his right shoulder into his right arm concerning for cervical radiculopathy also admitted.  Recommended MRI of the neck to reevaluate determine if the patient would benefit from epidural steroid injections or even neurosurgical consultation

## 2023-07-28 ENCOUNTER — Ambulatory Visit (HOSPITAL_COMMUNITY)
Admission: RE | Admit: 2023-07-28 | Discharge: 2023-07-28 | Disposition: A | Discharge status: Home / Self Care | Source: Ambulatory Visit | Attending: Family Medicine | Admitting: Family Medicine

## 2023-07-28 DIAGNOSIS — M542 Cervicalgia: Secondary | ICD-10-CM | POA: Insufficient documentation

## 2023-08-03 ENCOUNTER — Encounter: Payer: Self-pay | Admitting: Family Medicine

## 2023-08-13 ENCOUNTER — Other Ambulatory Visit: Payer: Self-pay

## 2023-08-13 DIAGNOSIS — M5030 Other cervical disc degeneration, unspecified cervical region: Secondary | ICD-10-CM

## 2023-08-23 NOTE — Progress Notes (Signed)
 Referring Physician:  Austine Lefort, MD [address] [place],  [place] [address]  Primary Physician:  Austine Lefort, MD  History of Present Illness: 08/24/2023 [name] has a history of HTN, hyperlipidemia, chronic neck pain.   Chronic constant neck pain x years that has been worse in last few months. He has significant stiffness in his neck- pain is severe with looking up. More of his pain is on the right and radiates to right shoulder. No right arm pain. He notes weakness in his neck muscles. No arm weakness. He has headaches as well. No dexterity issues. No balance issues, unless he looks up.   Some relief with motrin. He had cervical injections and PT back in 2019 ([facility]) with minimal improvement.   He does not smoke.   Bowel/Bladder Dysfunction: none  Conservative measures:  Physical therapy:  has participated in the 2019 but none recently Multimodal medical therapy including regular antiinflammatories: advil, celebrex, tylenol , aspirin Injections: he has had injections back in 2019 without relief  Past Surgery: no spinal surgeries  Kelli Pates has no symptoms of cervical myelopathy.  The symptoms are causing a significant impact on the patient's life.   Review of Systems:  A 10 point review of systems is negative, except for the pertinent positives and negatives detailed in the HPI.  Past Medical History: Past Medical History:  Diagnosis Date   Anxiety    Arthritis    hands, neck   Degenerative disc disease, cervical    no limitations   Hypercholesteremia    Hyperlipidemia    Hypertension    neg stress test several yrs ago   Lymphocytic colitis     Past Surgical History: Past Surgical History:  Procedure Laterality Date   COLONOSCOPY WITH PROPOFOL  N/A 03/04/2015   Procedure: COLONOSCOPY WITH PROPOFOL ;  Surgeon: Marnee Sink, MD;  Location: [facility];  Service: Endoscopy;  Laterality: N/A;   HAND SURGERY     fracture  repair    Allergies: Allergies as of 08/24/2023 - Review Complete 08/24/2023  Allergen Reaction Noted   Morphine and codeine Nausea Only 10/28/2012   Statins Other (See Comments) 10/28/2012   Zetia  [ezetimibe ]  07/14/2016    Medications: Outpatient Encounter Medications as of 08/24/2023  Medication Sig   ALPRAZolam  (XANAX ) 0.5 MG tablet Take 1 tablet (0.5 mg total) by mouth at bedtime as needed.   aspirin 81 MG tablet Take 81 mg by mouth daily.   budesonide  (ENTOCORT EC ) 3 MG 24 hr capsule TAKE 3 CAPSULES (9 MG TOTAL) BY MOUTH EVERY MORNING. (Patient taking differently: Take 9 mg by mouth as needed.)   Glucosamine-Chondroitin (OSTEO BI-FLEX REGULAR STRENGTH PO) Take 2 tablets by mouth daily.   metoprolol  succinate (TOPROL -XL) 25 MG 24 hr tablet TAKE 1 TABLET BY MOUTH EVERY DAY   Multiple Vitamin (MULTIVITAMIN) tablet Take 1 tablet by mouth daily.   Omega-3 Fatty Acids (FISH OIL) 1000 MG CAPS Take 2,000 mg by mouth daily.   PARoxetine  (PAXIL ) 20 MG tablet TAKE 1 TABLET BY MOUTH EVERY DAY   promethazine (PHENERGAN) 25 MG tablet TAKE 1 TABLET BY MOUTH EVERY 6 HOURS AS NEEDED FOR NAUSEA OR VOMITING.   rizatriptan  (MAXALT ) 10 MG tablet TAKE 1 TABLET BY MOUTH AS NEEDED FOR MIGRAINE. MAY REPEAT IN 2 HOURS IF NEEDED   rosuvastatin  (CRESTOR ) 5 MG tablet TAKE 1 TABLET BY MOUTH EVERY DAY   valsartan  (DIOVAN ) 160 MG tablet Take 1 tablet (160 mg total) by  mouth daily.   No facility-administered encounter medications on file as of 08/24/2023.    Social History: Social History   Tobacco Use   Smoking status: Never   Smokeless tobacco: Never  Vaping Use   Vaping status: Never Used  Substance Use Topics   Alcohol use: Yes    Alcohol/week: 4.0 standard drinks of alcohol    Types: 3 Cans of beer, 1 Shots of liquor per week    Comment: occasional   Drug use: No    Family Medical History: Family History  Problem Relation Age of Onset   Hyperlipidemia Mother    Arrhythmia Father         A-Fib    Hypertension Father     Physical Examination: Vitals:   08/24/23 0909  BP: (!) 140/100    General: Patient is well developed, well nourished, calm, collected, and in no apparent distress. Attention to examination is appropriate.  Respiratory: Patient is breathing without any difficulty.   NEUROLOGICAL:     Awake, alert, oriented to person, place, and time.  Speech is clear and fluent. Fund of knowledge is appropriate.   Cranial Nerves: Pupils equal round and reactive to light.  Facial tone is symmetric.    No posterior cervical tenderness. No tenderness in bilateral trapezial region.   Good ROM of both shoulders with no pain.   No abnormal lesions on exposed skin.   Strength: Side Biceps Triceps Deltoid Interossei Grip Wrist Ext. Wrist Flex.  R 5 5 5 5 5 5 5   L 5 5 5 5 5 5 5    Side Iliopsoas Quads Hamstring PF DF EHL  R 5 5 5 5 5 5   L 5 5 5 5 5 5    Reflexes are 2+ and symmetric at the biceps, brachioradialis, patella and achilles.   Hoffman's is absent.  Clonus is not present.   Bilateral upper and lower extremity sensation is intact to light touch.     Gait is normal.     Medical Decision Making  Imaging: Cervical MRI dated 07/28/23: FINDINGS: Alignment: Slight C4-C5 grade 1 anterolisthesis.   Vertebrae: Cervical vertebral body height is maintained. Mild degenerative endplate edema at M5-H8. Degenerative endplate irregularity and mixed degenerative endplate marrow signal at C6-C7 (including mild degenerative endplate edema). Marrow edema within the posterior elements on the left at C3 and on the right at C4-C5, likely degenerative and related to facet arthropathy.   Cord: No signal abnormality identified within the cervical spinal cord.   Posterior Fossa, vertebral arteries, paraspinal tissues: No abnormality identified within included portions of the posterior fossa. Flow voids preserved within the imaged cervical vertebral arteries. No  paraspinal mass or collection.   Disc levels:   Unless otherwise stated, the level by level findings below have not significantly changed from the prior MRI of 09/30/2017.   Multilevel disc degeneration, greatest at C5-C6 (mild-to-moderate) and C6-C7 (moderate-to-advanced). C5-C6 disc degeneration has slightly progressed. No significant spinal canal stenosis.   C2-C3: Facet arthropathy (greater on the left). No significant disc herniation or stenosis.   C3-C4: Slight disc bulge. Uncovertebral hypertrophy (greater on the right and progressed on the right). Facet arthropathy, progressed and greater on the right. No significant spinal canal stenosis. Bilateral neural foraminal narrowing (moderate-to-severe right, moderate left).   C4-C5: Slight grade 1 anterolisthesis, new from the prior MRI. Slight disc bulge, also new from the prior MRI. Facet arthropathy, progressed and greater on the right. No significant spinal canal stenosis. Moderate right neural foraminal narrowing,  new.   C5-C6: Slight disc bulge. Bilateral uncovertebral hypertrophy, progressed. Facet arthropathy, progressed and greater on the right. Bilateral neural foraminal narrowing (moderate right, mild left), progressed.   C6-C7: Disc bulge with endplate spurring and bilateral uncovertebral hypertrophy. No significant spinal canal stenosis. Mild bilateral neural foraminal narrowing (greater on the left).   C7-T1: Mild facet arthropathy (predominantly on the left). No significant disc herniation or stenosis.   IMPRESSION: 1. Cervical spondylosis as outlined within the body of the report and having progressed at multiple levels since the prior MRI 09/30/2017. 2. No significant spinal canal stenosis. 3. Multilevel foraminal stenosis greatest bilaterally at C3-C4 (moderate-to-severe right, moderate left), on the right at C4-C5 (moderate) and on the right at C5-C6 (moderate). 4. Disc degeneration is greatest at  C5-C6 (mild-to-moderate) and C6-C7 (moderate-to-advanced). 5. Mild degenerative endplate edema at Q6-V7 and C6-C7. 6. Marrow edema within the posterior elements on the left at C3, and on the right at C4-C5, likely degenerative and related to facet arthropathy. 7. Mild C4-C5 grade 1 anterolisthesis.     Electronically Signed   By: Bascom Lily D.O.   On: 08/12/2023 14:43    I have personally reviewed the images and agree with the above interpretation.  Assessment and Plan: Mr. Crutchfield has chronic constant neck pain x years that has been worse in last few months. He has significant stiffness in his neck- pain is severe with looking up. More of his pain is on the right and radiates to right shoulder. No right arm pain. No arm weakness.   He has known cervical spondylosis with slip at C4-C5 and DDD C5-C7. He has multilevel foraminal stenosis as well.   Neck pain is likely from spondylosis and DDD.   Treatment options discussed with patient and following plan made:   - Referral to pain management ([facility]) to discuss possible cervical injections.  - Discussed formal PT for cervical spine. He still does HEP from previous PT. Will hold on revisiting PT for now.  - Cervical xrays with flex/ext ordered. Will message him with results.  - Follow up with me in 6-8 weeks and prn.   BP was slightly elevated. No symptoms of chest pain, shortness of breath, blurry vision, or headaches. Will recheck at home and call PCP if not improved. If he develops CP, SOB, blurry vision, or headaches, then he will go to ED.     I spent a total of 35 minutes in face-to-face and non-face-to-face activities related to this patient's care today including review of outside records, review of imaging, review of symptoms, physical exam, discussion of differential diagnosis, discussion of treatment options, and documentation.   Thank you for involving me in the care of this patient.   Lucetta Russel PA-C Dept. of  Neurosurgery

## 2023-08-24 ENCOUNTER — Ambulatory Visit
Admission: RE | Admit: 2023-08-24 | Discharge: 2023-08-24 | Disposition: A | Discharge status: Home / Self Care | Attending: Orthopedic Surgery | Admitting: Orthopedic Surgery

## 2023-08-24 ENCOUNTER — Encounter: Payer: Self-pay | Admitting: Orthopedic Surgery

## 2023-08-24 ENCOUNTER — Ambulatory Visit: Admitting: Orthopedic Surgery

## 2023-08-24 ENCOUNTER — Ambulatory Visit
Admission: RE | Admit: 2023-08-24 | Discharge: 2023-08-24 | Disposition: A | Discharge status: Home / Self Care | Source: Ambulatory Visit | Attending: Orthopedic Surgery | Admitting: Orthopedic Surgery

## 2023-08-24 VITALS — BP 136/90 | Ht 72.0 in | Wt 205.4 lb

## 2023-08-24 DIAGNOSIS — M50322 Other cervical disc degeneration at C5-C6 level: Secondary | ICD-10-CM | POA: Diagnosis not present

## 2023-08-24 DIAGNOSIS — M5030 Other cervical disc degeneration, unspecified cervical region: Secondary | ICD-10-CM

## 2023-08-24 DIAGNOSIS — M47812 Spondylosis without myelopathy or radiculopathy, cervical region: Secondary | ICD-10-CM | POA: Insufficient documentation

## 2023-08-24 DIAGNOSIS — M50323 Other cervical disc degeneration at C6-C7 level: Secondary | ICD-10-CM

## 2023-08-24 DIAGNOSIS — M4312 Spondylolisthesis, cervical region: Secondary | ICD-10-CM | POA: Diagnosis present

## 2023-08-24 DIAGNOSIS — M4802 Spinal stenosis, cervical region: Secondary | ICD-10-CM

## 2023-08-24 NOTE — Patient Instructions (Addendum)
 It was so nice to see you today. Thank you so much for coming in.    You have some wear and tear in your neck and this is likely causing your pain.   I ordered xrays of your neck. You can get these at [facility] (building with the white pillars) off of [address]. The address is [address], [place], [place] [address]. You do not need any appointment. I will message you with results.   I want you to see pain management here in [place] (Dr. Rhesa Celeste) to discuss possible cervical injections. They should call you to schedule an appointment or you can call them at [phone].   Your blood pressure was slightly elevated today. I want you to recheck it at home and follow up with your PCP if it remains high. If you have any chest pain, shortness of breath, blurry vision, or headaches then you need to go to ED.    I will see you back in 6-8 weeks. Please do not hesitate to call if you have any questions or concerns. You can also message me in MyChart.   Lucetta Russel PA-C [phone]     The physicians and staff at [facility] are committed to providing excellent care. You may receive a survey asking for feedback about your experience at our office. We value you your feedback and appreciate you taking the time to to fill it out. The [facility] leadership team is also available to discuss your experience in person, feel free to contact us  [phone].

## 2023-09-01 ENCOUNTER — Encounter: Payer: Self-pay | Admitting: Orthopedic Surgery

## 2023-09-01 NOTE — Telephone Encounter (Signed)
 Cervical xrays dated 08/24/23:  FINDINGS: Mild 1-2 mm anterolisthesis of C4-C5 and C5-C6 on neutral view. This increases to 3 mm with flexion at C4-C5. No significant change with extension. Multilevel spondylosis and disc space height loss greatest at C6-C7 where it is moderate. Multilevel facet arthropathy greatest at C4-C5. Normal prevertebral soft tissues.   IMPRESSION: 1. Mild 1-2 mm of anterolisthesis of C4-C5 and C5-C6 on neutral view. This increases to 3 mm with flexion at C4-C5. 2. Multilevel spondylosis greatest at C6-C7. Facet arthropathy is greatest at C4-C5.     Electronically Signed   By: Rozell Cornet M.D.   On: 09/01/2023 00:45   I have personally reviewed the images and agree with the above interpretation.

## 2023-09-02 ENCOUNTER — Ambulatory Visit: Payer: Self-pay | Admitting: Family Medicine

## 2023-09-16 ENCOUNTER — Other Ambulatory Visit: Payer: Self-pay | Admitting: Family Medicine

## 2023-09-21 ENCOUNTER — Ambulatory Visit
Attending: Student in an Organized Health Care Education/Training Program | Admitting: Student in an Organized Health Care Education/Training Program

## 2023-09-21 ENCOUNTER — Encounter: Payer: Self-pay | Admitting: Student in an Organized Health Care Education/Training Program

## 2023-09-21 VITALS — BP 108/69 | HR 59 | Temp 97.7°F | Resp 18 | Ht 72.0 in | Wt 200.0 lb

## 2023-09-21 DIAGNOSIS — G894 Chronic pain syndrome: Secondary | ICD-10-CM | POA: Insufficient documentation

## 2023-09-21 DIAGNOSIS — M4802 Spinal stenosis, cervical region: Secondary | ICD-10-CM | POA: Insufficient documentation

## 2023-09-21 DIAGNOSIS — M47812 Spondylosis without myelopathy or radiculopathy, cervical region: Secondary | ICD-10-CM | POA: Diagnosis not present

## 2023-09-21 DIAGNOSIS — M5412 Radiculopathy, cervical region: Secondary | ICD-10-CM | POA: Insufficient documentation

## 2023-09-21 DIAGNOSIS — M5481 Occipital neuralgia: Secondary | ICD-10-CM | POA: Insufficient documentation

## 2023-09-21 NOTE — Patient Instructions (Signed)
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
## 2023-10-01 ENCOUNTER — Ambulatory Visit: Admitting: Family Medicine

## 2023-10-01 ENCOUNTER — Encounter: Payer: Self-pay | Admitting: Family Medicine

## 2023-10-01 VITALS — BP 132/85 | HR 51 | Temp 98.4°F | Ht 72.0 in | Wt 210.6 lb

## 2023-10-01 DIAGNOSIS — E7800 Pure hypercholesterolemia, unspecified: Secondary | ICD-10-CM | POA: Diagnosis not present

## 2023-10-01 NOTE — Progress Notes (Signed)
 Subjective:    Patient ID: Darryl Ross, male    DOB: 26-Mar-1966, 58 y.o.   MRN: [id number]  Patient is seeing a pain clinic now for epidural steroid injections for his neck.  He is yet to receive these.  We discussed gabapentin for neck pain but he would like to try the epidural steroid injections first.  He is here today to recheck his cholesterol.  Patient has a history of plaque formation in his LAD seen on coronary artery calcium  score.  For that reason we are trying to keep his LDL cholesterol less than 70.  He has been taking the statin daily.  He denies any myalgias or abdominal pain.  He has a history of elevated liver function test due to statin therapy in the past.  Past Medical History:  Diagnosis Date   Anxiety    Arthritis    hands, neck   Degenerative disc disease, cervical    no limitations   Hypercholesteremia    Hyperlipidemia    Hypertension    neg stress test several yrs ago   Lymphocytic colitis    Past Surgical History:  Procedure Laterality Date   COLONOSCOPY WITH PROPOFOL  N/A 03/04/2015   Procedure: COLONOSCOPY WITH PROPOFOL ;  Surgeon: Rogelia Copping, MD;  Location: [facility];  Service: Endoscopy;  Laterality: N/A;   HAND SURGERY     fracture repair   Current Outpatient Medications on File Prior to Visit  Medication Sig Dispense Refill   ALPRAZolam  (XANAX ) 0.5 MG tablet Take 1 tablet (0.5 mg total) by mouth at bedtime as needed. 30 tablet 0   aspirin 81 MG tablet Take 81 mg by mouth daily.     budesonide  (ENTOCORT EC ) 3 MG 24 hr capsule TAKE 3 CAPSULES (9 MG TOTAL) BY MOUTH EVERY MORNING. (Patient taking differently: Take 9 mg by mouth as needed.) 90 capsule 1   Glucosamine-Chondroitin (OSTEO BI-FLEX REGULAR STRENGTH PO) Take 2 tablets by mouth daily.     metoprolol  succinate (TOPROL -XL) 25 MG 24 hr tablet TAKE 1 TABLET BY MOUTH EVERY DAY 90 tablet 0   Multiple Vitamin (MULTIVITAMIN) tablet Take 1 tablet by mouth daily.     Omega-3 Fatty Acids  (FISH OIL) 1000 MG CAPS Take 2,000 mg by mouth daily.     PARoxetine  (PAXIL ) 20 MG tablet TAKE 1 TABLET BY MOUTH EVERY DAY 90 tablet 0   promethazine (PHENERGAN) 25 MG tablet TAKE 1 TABLET BY MOUTH EVERY 6 HOURS AS NEEDED FOR NAUSEA OR VOMITING. (Patient not taking: Reported on 09/21/2023) 1 tablet 0   rizatriptan  (MAXALT ) 10 MG tablet TAKE 1 TABLET BY MOUTH AS NEEDED FOR MIGRAINE. MAY REPEAT IN 2 HOURS IF NEEDED 10 tablet 1   rosuvastatin  (CRESTOR ) 5 MG tablet TAKE 1 TABLET BY MOUTH EVERY DAY 90 tablet 1   valsartan  (DIOVAN ) 160 MG tablet Take 1 tablet (160 mg total) by mouth daily. 30 tablet 3   No current facility-administered medications on file prior to visit.   Allergies  Allergen Reactions   Morphine And Codeine Nausea Only   Statins Other (See Comments)    Effected liver enzymes   Zetia  [Ezetimibe ]     Elevated LFT's   Social History   Socioeconomic History   Marital status: Married    Spouse name: Not on file   Number of children: Not on file   Years of education: Not on file   Highest education level: Some college, no degree  Occupational History   Not on  file  Tobacco Use   Smoking status: Never   Smokeless tobacco: Never  Vaping Use   Vaping status: Never Used  Substance and Sexual Activity   Alcohol use: Yes    Alcohol/week: 4.0 standard drinks of alcohol    Types: 3 Cans of beer, 1 Shots of liquor per week    Comment: occasional   Drug use: No   Sexual activity: Not on file    Comment: avid runner and cyclist.  Married.  Other Topics Concern   Not on file  Social History Narrative   Not on file   Social Drivers of Health   Financial Resource Strain: Low Risk  (09/27/2023)   Overall Financial Resource Strain (CARDIA)    Difficulty of Paying Living Expenses: Not hard at all  Food Insecurity: No Food Insecurity (09/27/2023)   Hunger Vital Sign    Worried About Running Out of Food in the Last Year: Never true    Ran Out of Food in the Last Year: Never true   Transportation Needs: No Transportation Needs (09/27/2023)   PRAPARE - Administrator, Civil Service (Medical): No    Lack of Transportation (Non-Medical): No  Physical Activity: Sufficiently Active (09/27/2023)   Exercise Vital Sign    Days of Exercise per Week: 5 days    Minutes of Exercise per Session: 60 min  Stress: No Stress Concern Present (09/27/2023)   [employer] of Occupational Health - Occupational Stress Questionnaire    Feeling of Stress: Only a little  Social Connections: Socially Integrated (09/27/2023)   Social Connection and Isolation Panel    Frequency of Communication with Friends and Family: More than three times a week    Frequency of Social Gatherings with Friends and Family: Twice a week    Attends Religious Services: More than 4 times per year    Active Member of [employer] or Organizations: Yes    Attends Engineer, structural: More than 4 times per year    Marital Status: Married  Catering manager Violence: Not on file      Review of Systems  Musculoskeletal:  Positive for neck pain.  All other systems reviewed and are negative.      Objective:   Physical Exam Vitals reviewed.   Cardiovascular:     Rate and Rhythm: Normal rate and regular rhythm.     Heart sounds: Normal heart sounds. No murmur heard. Pulmonary:     Effort: Pulmonary effort is normal. No respiratory distress.     Breath sounds: Normal breath sounds. No stridor. No wheezing or rales.   Musculoskeletal:     Cervical back: No swelling, deformity, spasms or bony tenderness.   Neurological:     Mental Status: He is alert and oriented to person, place, and time.     Coordination: Coordination normal.          Assessment & Plan:  Pure hypercholesterolemia - Plan: CBC with Differential/Platelet, Comprehensive metabolic panel with GFR, Lipid panel Recheck CMP and fasting lipid panel.  Goal LDL cholesterol is less than 70.  Patient has tried Zetia  and other  statins in the past with elevated liver function test.  His liver function test are rising, may need to consider trying Repatha

## 2023-10-02 LAB — LIPID PANEL
Cholesterol: 167 mg/dL (ref <200)
HDL: 69 mg/dL (ref >=40)
LDL Cholesterol (Calc): 79 mg/dL
Non-HDL Cholesterol (Calc): 98 mg/dL (ref <130)
Total CHOL/HDL Ratio: 2.4 (calc) (ref <5.0)
Triglycerides: 102 mg/dL (ref <150)

## 2023-10-02 LAB — COMPREHENSIVE METABOLIC PANEL WITH GFR
AG Ratio: 2.7 (calc) — ABNORMAL HIGH (ref 1.0–2.5)
ALT: 27 U/L (ref 9–46)
AST: 21 U/L (ref 10–35)
Albumin: 4.6 g/dL (ref 3.6–5.1)
Alkaline phosphatase (APISO): 42 U/L (ref 35–144)
BUN: 14 mg/dL (ref 7–25)
CO2: 28 mmol/L (ref 20–32)
Calcium: 9.3 mg/dL (ref 8.6–10.3)
Chloride: 105 mmol/L (ref 98–110)
Creat: 1.23 mg/dL (ref 0.70–1.30)
Globulin: 1.7 g/dL — ABNORMAL LOW (ref 1.9–3.7)
Glucose, Bld: 98 mg/dL (ref 65–99)
Potassium: 4.6 mmol/L (ref 3.5–5.3)
Sodium: 140 mmol/L (ref 135–146)
Total Bilirubin: 0.7 mg/dL (ref 0.2–1.2)
Total Protein: 6.3 g/dL (ref 6.1–8.1)
eGFR: 68 mL/min/{1.73_m2} (ref >=60)

## 2023-10-02 LAB — CBC WITH DIFFERENTIAL/PLATELET
Absolute Lymphocytes: 1587 {cells}/uL (ref 850–3900)
Absolute Monocytes: 736 {cells}/uL (ref 200–950)
Basophils Absolute: 51 {cells}/uL (ref 0–200)
Basophils Relative: 0.8 %
Eosinophils Absolute: 358 {cells}/uL (ref 15–500)
Eosinophils Relative: 5.6 %
HCT: 44.5 % (ref 38.5–50.0)
Hemoglobin: 14.9 g/dL (ref 13.2–17.1)
MCH: 32 pg (ref 27.0–33.0)
MCHC: 33.5 g/dL (ref 32.0–36.0)
MCV: 95.7 fL (ref 80.0–100.0)
MPV: 10.7 fL (ref 7.5–12.5)
Monocytes Relative: 11.5 %
Neutro Abs: 3667 {cells}/uL (ref 1500–7800)
Neutrophils Relative %: 57.3 %
Platelets: 266 10*3/uL (ref 140–400)
RBC: 4.65 10*6/uL (ref 4.20–5.80)
RDW: 12.9 % (ref 11.0–15.0)
Total Lymphocyte: 24.8 %
WBC: 6.4 10*3/uL (ref 3.8–10.8)

## 2023-10-05 ENCOUNTER — Ambulatory Visit: Payer: Self-pay | Admitting: Family Medicine

## 2023-10-06 ENCOUNTER — Encounter: Payer: Self-pay | Admitting: Student in an Organized Health Care Education/Training Program

## 2023-10-06 ENCOUNTER — Ambulatory Visit
Admission: RE | Admit: 2023-10-06 | Discharge: 2023-10-06 | Disposition: A | Discharge status: Home / Self Care | Source: Ambulatory Visit | Attending: Student in an Organized Health Care Education/Training Program | Admitting: Student in an Organized Health Care Education/Training Program

## 2023-10-06 ENCOUNTER — Ambulatory Visit (HOSPITAL_BASED_OUTPATIENT_CLINIC_OR_DEPARTMENT_OTHER): Admitting: Student in an Organized Health Care Education/Training Program

## 2023-10-06 VITALS — BP 120/79 | HR 59 | Temp 97.0°F | Resp 16 | Ht 72.0 in | Wt 200.0 lb

## 2023-10-06 DIAGNOSIS — M5412 Radiculopathy, cervical region: Secondary | ICD-10-CM | POA: Insufficient documentation

## 2023-10-06 DIAGNOSIS — M4802 Spinal stenosis, cervical region: Secondary | ICD-10-CM | POA: Insufficient documentation

## 2023-10-06 MED ORDER — SODIUM CHLORIDE 0.9% FLUSH
1.0000 mL | Freq: Once | INTRAVENOUS | Status: AC
  Administered 2023-10-06: 1 mL

## 2023-10-06 MED ORDER — DEXAMETHASONE SODIUM PHOSPHATE 10 MG/ML IJ SOLN
10.0000 mg | Freq: Once | INTRAMUSCULAR | Status: AC
  Administered 2023-10-06: 10 mg
  Filled 2023-10-06: qty 1

## 2023-10-06 MED ORDER — ROPIVACAINE HCL 2 MG/ML IJ SOLN
1.0000 mL | Freq: Once | INTRAMUSCULAR | Status: AC
  Administered 2023-10-06: 1 mL via EPIDURAL
  Filled 2023-10-06: qty 20

## 2023-10-06 MED ORDER — LIDOCAINE HCL 2 % IJ SOLN
20.0000 mL | Freq: Once | INTRAMUSCULAR | Status: AC
  Administered 2023-10-06: 400 mg
  Filled 2023-10-06: qty 20

## 2023-10-06 MED ORDER — IOHEXOL 180 MG/ML  SOLN
10.0000 mL | Freq: Once | INTRAMUSCULAR | Status: AC
  Administered 2023-10-06: 10 mL via EPIDURAL
  Filled 2023-10-06: qty 20

## 2023-10-06 NOTE — Patient Instructions (Signed)

## 2023-10-06 NOTE — Progress Notes (Signed)
 PROVIDER NOTE: Interpretation of information contained herein should be left to medically-trained personnel. Specific patient instructions are provided elsewhere under Patient Instructions section of medical record. This document was created in part using STT-dictation technology, any transcriptional errors that may result from this process are unintentional.  Patient: Darryl Ross Type: Established DOB: 03-29-1966 MRN: [id number] PCP: Duanne Butler DASEN, MD  Service: Procedure DOS: 10/06/2023 Setting: Ambulatory Location: Ambulatory outpatient facility Delivery: Face-to-face Provider: Wallie Sherry, MD Specialty: Interventional Pain Management Specialty designation: 09 Location: Outpatient facility Ref. Prov.: Duanne Butler DASEN, MD       Interventional Therapy   Type: Cervical Epidural Steroid injection (CESI) (Interlaminar) #1  Laterality: Right (-RT)  Level: C7-T1 DOS: 10/06/2023  Provider: Wallie Sherry, MD Imaging: Fluoroscopy-guided Spinal ([id number]) Anesthesia: Local anesthesia (1-2% Lidocaine)  Medical Necessity Purpose: Diagnostic/Therapeutic Rationale (medical necessity): procedure needed and proper for the diagnosis and/or treatment of Darryl Ross's medical symptoms and needs. Indications: Cervicalgia, cervical radicular pain, degenerative disc disease, severe enough to impact quality of life or function. 1. Cervical radicular pain   2. Foraminal stenosis of cervical region    NAS-11 Pain score:   Pre-procedure: 8 /10   Post-procedure: 8 /10     Position  Prep  Materials:  Location setting: Procedure suite Position: Prone, on modified reverse trendelenburg to facilitate breathing, with head in head-cradle. Pillows positioned under chest (below chin-level) with cervical spine flexed. Safety Precautions: Patient was assessed for positional comfort and pressure points before starting the procedure. Prepping solution: DuraPrep (Iodine Povacrylex [0.7% available iodine]  and Isopropyl Alcohol, 74% w/w) Prep Area: Entire  cervicothoracic region Approach: percutaneous, paramedial Intended target: Posterior cervical epidural space Materials Procedure:  Tray: Epidural Needle(s): Epidural (Tuohy) Qty: 1 Length: (90mm) 3.5-inch Gauge: 17G  H&P (Pre-op Assessment):  Darryl Ross is a 58 y.o. (year old), male patient, seen today for interventional treatment. He  has a past surgical history that includes Hand surgery and Colonoscopy with propofol  (N/A, 03/04/2015). Darryl Ross has a current medication list which includes the following prescription(s): alprazolam , aspirin, budesonide , glucosamine-chondroitin, meclizine , metoprolol  succinate, multivitamin, fish oil, paroxetine , promethazine, rizatriptan , rosuvastatin , and valsartan . His primarily concern today is the Neck Pain  Initial Vital Signs:  Pulse/HCG Rate: (!) 59ECG Heart Rate: (!) 54 Temp: (!) 97 F (36.1 C) Resp: 14 BP: (!) 141/90 SpO2: 100 %  BMI: Estimated body mass index is 27.12 kg/m as calculated from the following:   Height as of this encounter: 6' (1.829 m).   Weight as of this encounter: 200 lb (90.7 kg).  Risk Assessment: Allergies: Reviewed. He is allergic to morphine and codeine, statins, and zetia  [ezetimibe ].  Allergy Precautions: None required Coagulopathies: Reviewed. None identified.  Blood-thinner therapy: None at this time Active Infection(s): Reviewed. None identified. Darryl Ross is afebrile  Site Confirmation: Darryl Ross was asked to confirm the procedure and laterality before marking the site Procedure checklist: Completed Consent: Before the procedure and under the influence of no sedative(s), amnesic(s), or anxiolytics, the patient was informed of the treatment options, risks and possible complications. To fulfill our ethical and legal obligations, as recommended by the American Medical Association's Code of Ethics, I have informed the patient of my clinical impression; the  nature and purpose of the treatment or procedure; the risks, benefits, and possible complications of the intervention; the alternatives, including doing nothing; the risk(s) and benefit(s) of the alternative treatment(s) or procedure(s); and the risk(s) and benefit(s) of doing nothing. The patient was provided information about the general risks  and possible complications associated with the procedure. These may include, but are not limited to: failure to achieve desired goals, infection, bleeding, organ or nerve damage, allergic reactions, paralysis, and death. In addition, the patient was informed of those risks and complications associated to Spine-related procedures, such as failure to decrease pain; infection (i.e.: Meningitis, epidural or intraspinal abscess); bleeding (i.e.: epidural hematoma, subarachnoid hemorrhage, or any other type of intraspinal or peri-dural bleeding); organ or nerve damage (i.e.: Any type of peripheral nerve, nerve root, or spinal cord injury) with subsequent damage to sensory, motor, and/or autonomic systems, resulting in permanent pain, numbness, and/or weakness of one or several areas of the body; allergic reactions; (i.e.: anaphylactic reaction); and/or death. Furthermore, the patient was informed of those risks and complications associated with the medications. These include, but are not limited to: allergic reactions (i.e.: anaphylactic or anaphylactoid reaction(s)); adrenal axis suppression; blood sugar elevation that in diabetics may result in ketoacidosis or comma; water  retention that in patients with history of congestive heart failure may result in shortness of breath, pulmonary edema, and decompensation with resultant heart failure; weight gain; swelling or edema; medication-induced neural toxicity; particulate matter embolism and blood vessel occlusion with resultant organ, and/or nervous system infarction; and/or aseptic necrosis of one or more joints. Finally, the  patient was informed that Medicine is not an exact science; therefore, there is also the possibility of unforeseen or unpredictable risks and/or possible complications that may result in a catastrophic outcome. The patient indicated having understood very clearly. We have given the patient no guarantees and we have made no promises. Enough time was given to the patient to ask questions, all of which were answered to the patient's satisfaction. Mr. Tiggs has indicated that he wanted to continue with the procedure. Attestation: I, the ordering provider, attest that I have discussed with the patient the benefits, risks, side-effects, alternatives, likelihood of achieving goals, and potential problems during recovery for the procedure that I have provided informed consent. Date  Time: 10/06/2023 11:04 AM  Pre-Procedure Preparation:  Monitoring: As per clinic protocol. Respiration, ETCO2, SpO2, BP, heart rate and rhythm monitor placed and checked for adequate function Safety Precautions: Patient was assessed for positional comfort and pressure points before starting the procedure. Time-out: I initiated and conducted the Time-out before starting the procedure, as per protocol. The patient was asked to participate by confirming the accuracy of the Time Out information. Verification of the correct person, site, and procedure were performed and confirmed by me, the nursing staff, and the patient. Time-out conducted as per Joint Commission's Universal Protocol (UP.01.01.01). Time: 1145 Start Time: 1145 hrs.  Description  Narrative of Procedure:          Rationale (medical necessity): procedure needed and proper for the diagnosis and/or treatment of the patient's medical symptoms and needs. Start Time: 1145 hrs. Safety Precautions: Aspiration looking for blood return was conducted prior to all injections. At no point did we inject any substances, as a needle was being advanced. No attempts were made at  seeking any paresthesias. Safe injection practices and needle disposal techniques used. Medications properly checked for expiration dates. SDV (single dose vial) medications used. Description of procedure: Protocol guidelines were followed. The patient was assisted into a comfortable position. The target area was identified and the area prepped in the usual manner. Skin & deeper tissues infiltrated with local anesthetic. Appropriate amount of time allowed to pass for local anesthetics to take effect. Using fluoroscopic guidance, the epidural needle was introduced  through the skin, ipsilateral to the reported pain, and advanced to the target area. Posterior laminar os was contacted and the needle walked caudad, until the lamina was cleared. The ligamentum flavum was engaged and the epidural space identified using "loss-of-resistance technique" with 2-3 ml of PF-NaCl (0.9% NSS), in a 5cc dedicated LOR syringe. (See Imaging guidance below for use of contrast details.) Once proper needle placement was secured, and negative aspiration confirmed, the solution was injected in intermittent fashion, asking for systemic symptoms every 0.5cc. The needles were then removed and the area cleansed, making sure to leave some of the prepping solution back to take advantage of its long term bactericidal properties.  Vitals:   10/06/23 1107 10/06/23 1137 10/06/23 1145 10/06/23 1148  BP: (!) 141/90 123/77 113/78 120/79  Pulse: (!) 59     Resp: 14 15 16 16   Temp: (!) 97 F (36.1 C)     TempSrc: Temporal     SpO2: 100% 99% 100% 100%  Weight: 200 lb (90.7 kg)     Height: 6' (1.829 m)        End Time: 1147 hrs.  Imaging Guidance (Spinal):          Type of Imaging Technique: Fluoroscopy Guidance (Spinal) Indication(s): Fluoroscopy guidance for needle placement to enhance accuracy in procedures requiring precise needle localization for targeted delivery of medication in or near specific anatomical locations not easily  accessible without such real-time imaging assistance. Exposure Time: Please see nurses notes. Contrast: Before injecting any contrast, we confirmed that the patient did not have an allergy to iodine, shellfish, or radiological contrast. Once satisfactory needle placement was completed at the desired level, radiological contrast was injected. Contrast injected under live fluoroscopy. No contrast complications. See chart for type and volume of contrast used. Fluoroscopic Guidance: I was personally present during the use of fluoroscopy. Tunnel Vision Technique used to obtain the best possible view of the target area. Parallax error corrected before commencing the procedure. Direction-depth-direction technique used to introduce the needle under continuous pulsed fluoroscopy. Once target was reached, antero-posterior, oblique, and lateral fluoroscopic projection used confirm needle placement in all planes. Images permanently stored in EMR. Interpretation: I personally interpreted the imaging intraoperatively. Adequate needle placement confirmed in multiple planes. Appropriate spread of contrast into desired area was observed. No evidence of afferent or efferent intravascular uptake. No intrathecal or subarachnoid spread observed. Permanent images saved into the patient's record.  Post-operative Assessment:  Post-procedure Vital Signs:  Pulse/HCG Rate: (!) 59(!) 54 Temp: (!) 97 F (36.1 C) Resp: 16 BP: 120/79 SpO2: 100 %  EBL: None  Complications: No immediate post-treatment complications observed by team, or reported by patient.  Note: The patient tolerated the entire procedure well. A repeat set of vitals were taken after the procedure and the patient was kept under observation following institutional policy, for this type of procedure. Post-procedural neurological assessment was performed, showing return to baseline, prior to discharge. The patient was provided with post-procedure discharge  instructions, including a section on how to identify potential problems. Should any problems arise concerning this procedure, the patient was given instructions to immediately contact us , at any time, without hesitation. In any case, we plan to contact the patient by telephone for a follow-up status report regarding this interventional procedure.  Comments:  No additional relevant information.  Plan of Care (POC)  Orders:  Orders Placed This Encounter  Procedures   DG PAIN CLINIC C-ARM 1-60 MIN NO REPORT    Intraoperative interpretation by  procedural physician at [facility].    Standing Status:   Standing    Number of Occurrences:   1    Reason for exam::   Assistance in needle guidance and placement for procedures requiring needle placement in or near specific anatomical locations not easily accessible without such assistance.    Medications ordered for procedure: Meds ordered this encounter  Medications   iohexol  (OMNIPAQUE ) 180 MG/ML injection 10 mL    Must be Myelogram-compatible. If not available, you may substitute with a water -soluble, non-ionic, hypoallergenic, myelogram-compatible radiological contrast medium.   lidocaine (XYLOCAINE) 2 % (with pres) injection 400 mg   ropivacaine (PF) 2 mg/mL (0.2%) (NAROPIN) injection 1 mL   sodium chloride flush (NS) 0.9 % injection 1 mL   dexamethasone  (DECADRON ) injection 10 mg   Medications administered: We administered iohexol , lidocaine, ropivacaine (PF) 2 mg/mL (0.2%), sodium chloride flush, and dexamethasone .  See the medical record for exact dosing, route, and time of administration.    Right C7-T1 ESI 10/06/23    Follow-up plan:   Return in about 4 weeks (around 11/03/2023) for PPE, F2F.     Recent Visits Date Type Provider Dept  09/21/23 Office Visit Marcelino Nurse, MD [facility]  Showing recent visits within past 90 days and meeting all other requirements Today's Visits Date Type Provider Dept   10/06/23 Procedure visit Marcelino Nurse, MD [facility]  Showing today's visits and meeting all other requirements Future Appointments Date Type Provider Dept  11/04/23 Appointment Marcelino Nurse, MD [facility]  Showing future appointments within next 90 days and meeting all other requirements   Disposition: Discharge home  Discharge (Date  Time): 10/06/2023; 1153 hrs.   Primary Care Physician: Duanne Butler DASEN, MD Location: [facility] Note by: Nurse Marcelino, MD (TTS technology used. I apologize for any typographical errors that were not detected and corrected.) Date: 10/06/2023; Time: 11:54 AM  Disclaimer:  Medicine is not an Visual merchandiser. The only guarantee in medicine is that nothing is guaranteed. It is important to note that the decision to proceed with this intervention was based on the information collected from the patient. The Data and conclusions were drawn from the patient's questionnaire, the interview, and the physical examination. Because the information was provided in large part by the patient, it cannot be guaranteed that it has not been purposely or unconsciously manipulated. Every effort has been made to obtain as much relevant data as possible for this evaluation. It is important to note that the conclusions that lead to this procedure are derived in large part from the available data. Always take into account that the treatment will also be dependent on availability of resources and existing treatment guidelines, considered by other Pain Management Practitioners as being common knowledge and practice, at the time of the intervention. For Medico-Legal purposes, it is also important to point out that variation in procedural techniques and pharmacological choices are the acceptable norm. The indications, contraindications, technique, and results of the above procedure should only be interpreted and judged by a Board-Certified  Interventional Pain Specialist with extensive familiarity and expertise in the same exact procedure and technique.

## 2023-10-07 ENCOUNTER — Telehealth: Payer: Self-pay | Admitting: *Deleted

## 2023-10-07 ENCOUNTER — Other Ambulatory Visit: Payer: Self-pay | Admitting: Family Medicine

## 2023-10-07 NOTE — Telephone Encounter (Signed)
 Post procedure call; voicemail left

## 2023-10-08 NOTE — Progress Notes (Signed)
 Referring Physician:  Duanne Butler DASEN, MD [address] [place] [address]  Primary Physician:  Duanne Butler DASEN, MD  History of Present Illness: 10/19/2023 [name] has a history of HTN, hyperlipidemia, chronic neck pain.   Last seen by me on 08/24/23 for neck pain with some radiation to right shoulder. He has known cervical spondylosis with slip at C4-C5 and C5-C6. Has slight movement at C4-C5 on flexion. Also with  DDD C5-C7 multilevel foraminal stenosis.   He was sent to pain management and had right C7-T1 IL ESI by Dr. Marcelino on 10/06/23.   He is here for follow up.   He had 50% improvement in right shoulder pain with injection. He still has some intermittent right sided neck pain- better in morning and worse as the day progresses. No pain into the arm. His headaches have been better as well. No numbness, tingling, or weakness.   Some relief with motrin- has not had to take as much since injection. He goes to gym 4 days a week- does cardio and lifts weights.   He does not smoke.   Bowel/Bladder Dysfunction: none  Conservative measures:  Physical therapy:  has participated in the 2019 but none recently Multimodal medical therapy including regular antiinflammatories: advil, celebrex, tylenol , aspirin Injections:  right C7-T1 IL ESI by Dr. Marcelino on 10/06/23  Past Surgery: no spinal surgeries  Darryl Ross has no symptoms of cervical myelopathy.  The symptoms are causing a significant impact on the patient's life.   Review of Systems:  A 10 point review of systems is negative, except for the pertinent positives and negatives detailed in the HPI.  Past Medical History: Past Medical History:  Diagnosis Date   Anxiety    Arthritis    hands, neck   Degenerative disc disease, cervical    no limitations   Hypercholesteremia    Hyperlipidemia    Hypertension    neg stress test several yrs ago   Lymphocytic colitis     Past Surgical  History: Past Surgical History:  Procedure Laterality Date   COLONOSCOPY WITH PROPOFOL  N/A 03/04/2015   Procedure: COLONOSCOPY WITH PROPOFOL ;  Surgeon: Rogelia Copping, MD;  Location: [facility];  Service: Endoscopy;  Laterality: N/A;   HAND SURGERY     fracture repair    Allergies: Allergies as of 10/19/2023 - Review Complete 10/19/2023  Allergen Reaction Noted   Morphine and codeine Nausea Only 10/28/2012   Statins Other (See Comments) 10/28/2012   Zetia  [ezetimibe ]  07/14/2016    Medications: Outpatient Encounter Medications as of 10/19/2023  Medication Sig   ALPRAZolam  (XANAX ) 0.5 MG tablet Take 1 tablet (0.5 mg total) by mouth at bedtime as needed.   aspirin 81 MG tablet Take 81 mg by mouth daily.   budesonide  (ENTOCORT EC ) 3 MG 24 hr capsule TAKE 3 CAPSULES (9 MG TOTAL) BY MOUTH EVERY MORNING.   Glucosamine-Chondroitin (OSTEO BI-FLEX REGULAR STRENGTH PO) Take 2 tablets by mouth daily.   meclizine  (ANTIVERT ) 25 MG tablet Take 25 mg by mouth 3 (three) times daily as needed for dizziness.   metoprolol  succinate (TOPROL -XL) 25 MG 24 hr tablet TAKE 1 TABLET BY MOUTH EVERY DAY   Multiple Vitamin (MULTIVITAMIN) tablet Take 1 tablet by mouth daily.   Omega-3 Fatty Acids (FISH OIL) 1000 MG CAPS Take 2,000 mg by mouth daily.   PARoxetine  (PAXIL ) 20 MG tablet TAKE 1 TABLET BY MOUTH EVERY DAY   rizatriptan  (MAXALT ) 10 MG  tablet TAKE 1 TABLET BY MOUTH AS NEEDED FOR MIGRAINE. MAY REPEAT IN 2 HOURS IF NEEDED   rosuvastatin  (CRESTOR ) 5 MG tablet TAKE 1 TABLET BY MOUTH EVERY DAY   valsartan  (DIOVAN ) 160 MG tablet Take 1 tablet (160 mg total) by mouth daily.   [DISCONTINUED] promethazine (PHENERGAN) 25 MG tablet TAKE 1 TABLET BY MOUTH EVERY 6 HOURS AS NEEDED FOR NAUSEA OR VOMITING.   [DISCONTINUED] rizatriptan  (MAXALT ) 10 MG tablet TAKE 1 TABLET BY MOUTH AS NEEDED FOR MIGRAINE. MAY REPEAT IN 2 HOURS IF NEEDED   No facility-administered encounter medications on file as of 10/19/2023.     Social History: Social History   Tobacco Use   Smoking status: Never   Smokeless tobacco: Never  Vaping Use   Vaping status: Never Used  Substance Use Topics   Alcohol use: Yes    Alcohol/week: 4.0 standard drinks of alcohol    Types: 3 Cans of beer, 1 Shots of liquor per week    Comment: occasional   Drug use: No    Family Medical History: Family History  Problem Relation Age of Onset   Hyperlipidemia Mother    Arrhythmia Father        A-Fib    Hypertension Father     Physical Examination: Vitals:   10/19/23 1040  BP: 138/88      Awake, alert, oriented to person, place, and time.  Speech is clear and fluent. Fund of knowledge is appropriate.   Cranial Nerves: Pupils equal round and reactive to light.  Facial tone is symmetric.    No abnormal lesions on exposed skin.   Strength: Side Biceps Triceps Deltoid Interossei Grip Wrist Ext. Wrist Flex.  R 5 5 5 5 5 5 5   L 5 5 5 5 5 5 5    Reflexes are 2+ and symmetric at the biceps, brachioradialis.  Hoffman's is absent.   Bilateral upper extremity sensation is intact to light touch.     Gait is normal.     Medical Decision Making  Imaging: None  Assessment and Plan: Darryl Ross had 50% improvement in right shoulder pain with injection. He still has some intermittent right sided neck pain- better in morning and worse as the day progresses. No arm pain. No numbness, tingling, or weakness.   He has known cervical spondylosis with slip at C4-C5 and C5-C6. Has slight movement at C4-C5 on flexion. Also with  DDD C5-C7 multilevel foraminal stenosis.   Neck pain is likely from spondylosis and DDD.   Treatment options discussed with patient and following plan made:   - Follow up with Dr. Lateef as scheduled. May consider repeat ESI and/or possible facet injections.  - He goes to gym 4 days a week. Hold on formal PT at this time.  - Will follow with pain management for now, but I am happy to see him back as  needed.   I spent a total of 25 minutes in face-to-face and non-face-to-face activities related to this patient's care today including review of outside records, review of imaging, review of symptoms, physical exam, discussion of differential diagnosis, discussion of treatment options, and documentation.   Glade Boys PA-C Dept. of Neurosurgery

## 2023-10-19 ENCOUNTER — Ambulatory Visit: Admitting: Orthopedic Surgery

## 2023-10-19 ENCOUNTER — Encounter: Payer: Self-pay | Admitting: Orthopedic Surgery

## 2023-10-19 VITALS — BP 138/88 | Ht 72.0 in | Wt 200.0 lb

## 2023-10-19 DIAGNOSIS — M4802 Spinal stenosis, cervical region: Secondary | ICD-10-CM

## 2023-10-19 DIAGNOSIS — M47812 Spondylosis without myelopathy or radiculopathy, cervical region: Secondary | ICD-10-CM | POA: Diagnosis not present

## 2023-10-19 DIAGNOSIS — M4312 Spondylolisthesis, cervical region: Secondary | ICD-10-CM

## 2023-10-19 DIAGNOSIS — M50321 Other cervical disc degeneration at C4-C5 level: Secondary | ICD-10-CM

## 2023-10-19 DIAGNOSIS — M5030 Other cervical disc degeneration, unspecified cervical region: Secondary | ICD-10-CM

## 2023-10-19 DIAGNOSIS — M50322 Other cervical disc degeneration at C5-C6 level: Secondary | ICD-10-CM | POA: Diagnosis not present

## 2023-10-20 ENCOUNTER — Other Ambulatory Visit: Payer: Self-pay | Admitting: Family Medicine

## 2023-11-04 ENCOUNTER — Ambulatory Visit
Attending: Student in an Organized Health Care Education/Training Program | Admitting: Student in an Organized Health Care Education/Training Program

## 2023-11-04 ENCOUNTER — Encounter: Payer: Self-pay | Admitting: Student in an Organized Health Care Education/Training Program

## 2023-11-04 VITALS — BP 124/78 | HR 57 | Temp 97.7°F | Ht 72.0 in | Wt 210.0 lb

## 2023-11-04 DIAGNOSIS — M5412 Radiculopathy, cervical region: Secondary | ICD-10-CM | POA: Insufficient documentation

## 2023-11-04 DIAGNOSIS — M5481 Occipital neuralgia: Secondary | ICD-10-CM | POA: Diagnosis present

## 2023-11-04 DIAGNOSIS — M4802 Spinal stenosis, cervical region: Secondary | ICD-10-CM | POA: Insufficient documentation

## 2023-11-04 NOTE — Progress Notes (Signed)
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 1 10/29/2016          Rheumatology Recent Labs       Lab Results  Component Value Date    RF <14 10/29/2016    ANA NEG 10/29/2016    LABURIC 7.2 10/29/2016         Coagulation Recent Labs       Lab Results  Component Value Date    INR 1.0 07/23/2021    LABPROT 12.5 07/23/2021    APTT 29 07/23/2021    PLT 237 07/05/2023          Cardiovascular Recent Labs       Lab Results  Component Value Date    HGB 14.6 07/05/2023    HCT 45.0 07/05/2023         Screening Recent Labs       Lab Results  Component Value Date    HCVAB NEGATIVE 11/09/2013    HIV NON-REACTIVE 02/09/2017          Cancer Recent Labs  No results found for: CEA, CA125, LABCA2       Allergens Recent Labs  No results found for: ALMOND, APPLE, ASPARAGUS, AVOCADO, BANANA, BARLEY, BASIL, BAYLEAF, GREENBEAN, LIMABEAN, WHITEBEAN, BEEFIGE, REDBEET, BLUEBERRY, BROCCOLI, CABBAGE, MELON, CARROT, CASEIN, CASHEWNUT, CAULIFLOWER, CELERY          Note: Lab results reviewed.   PFSH  Drug: Darryl Ross  reports no history of drug use. Alcohol:  reports current alcohol use of about 4.0 standard drinks of alcohol per week. Tobacco:  reports that he has never smoked. He has never used smokeless tobacco. Medical:  has a past medical history of Anxiety, Arthritis, Degenerative disc disease, cervical, Hypercholesteremia, Hyperlipidemia, Hypertension, and Lymphocytic colitis. Family: family history includes Arrhythmia in his father; Hyperlipidemia in his mother; Hypertension in his father.        Past Surgical History:  Procedure Laterality Date   COLONOSCOPY WITH PROPOFOL  N/A 03/04/2015    Procedure: COLONOSCOPY WITH PROPOFOL ;  Surgeon: Rogelia Copping, MD;  Location: [facility];  Service: Endoscopy;  Laterality: N/A;   HAND SURGERY        fracture repair            Active Ambulatory Problems    Diagnosis Date Noted   Hyperlipidemia     Hypertension     Special screening for malignant neoplasms, colon     Arthropathy of lumbar facet joint 03/04/2017   DDD (degenerative disc disease), cervical 10/11/2017   Inflammation of sacroiliac joint (HCC) 03/04/2017        Resolved Ambulatory Problems    Diagnosis Date Noted   No Resolved Ambulatory Problems        Past Medical History:  Diagnosis Date   Anxiety     Arthritis     Degenerative disc disease, cervical     Hypercholesteremia     Lymphocytic colitis      Constitutional Exam  General appearance: Well nourished, well developed, and well hydrated. In no apparent acute distress    Vitals:    09/21/23 0910  BP: 108/69  Pulse: (!) 59  Resp: 18  Temp: 97.7 F (36.5 C)   TempSrc: Oral  SpO2: 98%  Weight: 200 lb (90.7 kg)  Height: 6' (1.829 m)    BMI Assessment: Estimated body mass index is 27.12 kg/m as calculated from the following:   Height as of this encounter: 6' (1.829 m).   Weight as of this encounter: 200 lb (90.7 kg).   BMI interpretation table:        BMI level Category Range association with higher incidence of chronic pain  <18 kg/m2 Underweight     18.5-24.9 kg/m2 Ideal body weight     25-29.9 kg/m2 Overweight Increased incidence by 20%   30-34.9 kg/m2 Obese (Class I) Increased incidence by 68%   35-39.9 kg/m2 Severe obesity (Class II) Increased incidence by 136%   >40 kg/m2 Extreme obesity (Class III) Increased incidence by 254%     Patient's current BMI Ideal Body weight  Body mass index is 27.12 kg/m. Ideal body weight: 77.6 kg (171 lb 1.2 oz) Adjusted ideal body weight: 82.8 kg (182 lb 10.3 oz)       BMI Readings from Last 4 Encounters:  09/21/23 27.12 kg/m  08/24/23 27.86 kg/m  07/20/23 27.15 kg/m  04/05/23 28.27 kg/m       Wt Readings from Last 4 Encounters:  09/21/23 200 lb (90.7 kg)  08/24/23 205 lb 6.4 oz (93.2 kg)  07/20/23 205 lb 12.8 oz (93.4  kg)  04/05/23 214 lb 4 oz (97.2 kg)      Psych/Mental status: Alert, oriented x 3 (person, place, & time)       Eyes: PERLA Respiratory: No evidence of acute respiratory distress   Cervical Spine Area Exam  Skin & Axial Inspection: No masses, redness, edema, swelling, or associated skin lesions Alignment: Symmetrical Functional ROM: Unrestricted ROM      Stability: No instability detected Muscle Tone/Strength: Functionally intact. No obvious neuro-muscular anomalies detected. Sensory (Neurological): Dermatomal pain pattern Palpation: No palpable anomalies             Upper Extremity (UE) Exam      Side: Right upper extremity   Side: Left upper extremity  Skin & Extremity Inspection: Skin color, temperature, and hair growth are WNL. No peripheral edema or  cyanosis. No masses, redness, swelling, asymmetry, or associated skin lesions. No contractures.   Skin & Extremity Inspection: Skin color, temperature, and hair growth are WNL. No peripheral edema or cyanosis. No masses, redness, swelling, asymmetry, or associated skin lesions. No contractures.  Functional ROM: Pain restricted ROM           Functional ROM: Unrestricted ROM          Muscle Tone/Strength: Functionally intact. No obvious neuro-muscular anomalies detected.   Muscle Tone/Strength: Functionally intact. No obvious neuro-muscular anomalies detected.  Sensory (Neurological): Unimpaired           Sensory (Neurological): Unimpaired          Palpation: No palpable anomalies               Palpation: No palpable anomalies              Provocative Test(s):  Phalen's test: deferred Tinel's test: deferred Apley's scratch test (touch opposite shoulder):  Action 1 (Across chest): deferred Action 2 (Overhead): deferred Action 3 (LB reach): deferred     Provocative Test(s):  Phalen's test: deferred Tinel's test: deferred Apley's scratch test (touch opposite shoulder):  Action 1 (Across chest): deferred Action 2 (Overhead): deferred Action 3 (LB reach): deferred      5 out of 5 strength bilateral upper extremity: Shoulder abduction, elbow flexion, elbow extension, thumb extension.    Assessment   Diagnosis Status  1. Cervical radicular pain   2. Foraminal stenosis of cervical region   3. Cervico-occipital neuralgia    Responding Responding Having a Flare-up   Updated Problems: No problems updated.  Plan of Care  Problem-specific:  Assessment and Plan    Right cervical radiculopathy   He experiences right cervical radiculopathy with initial relief from an epidural injection, but symptoms returned after a couple of weeks. Pain radiates down the shoulder and up the back of the head, causing headaches. The previous injection provided temporary relief, indicating partial efficacy.  Increasing the injection volume may enhance pain relief. Repeat the right cervical epidural steroid injection with increased volume. Schedule the procedure for November 15, 2023, without sedation.  Right occipital neuralgia   He has right occipital neuralgia with pain distribution consistent with occipital nerve involvement, described as pain running up and over the head. Consider an occipital nerve block.       Darryl Ross has a current medication list which includes the following long-term medication(s): metoprolol  succinate, paroxetine , rizatriptan , rosuvastatin , and valsartan .  Pharmacotherapy (Medications Ordered): No orders of the defined types were placed in this encounter.  Orders:  Orders Placed This Encounter  Procedures   Cervical Epidural Injection  Sedation: Patient's choice. Purpose: Diagnostic/Therapeutic Indication(s): Radiculitis and cervicalgia associater with cervical degenerative disc disease.    Standing Status:   Future    Expected Date:   11/15/2023    Expiration Date:   11/03/2024    Scheduling Instructions:     Procedure: Cervical Epidural Steroid Injection/Block     Level(s): C7-T1     Laterality: RIGHT     Timeframe: As soon as schedule allows.    Where will this procedure be performed?:   [facility]             Alexsis Branscom   GREATER OCCIPITAL NERVE BLOCK    Standing Status:   Future    Expected Date:   11/15/2023    Expiration Date:   11/03/2024    Scheduling Instructions:     Procedure: Occipital nerve block     Laterality:RIGHT     Sedation: Patient's choice.     Timeframe: ASAA    Where will this procedure be performed?:   [facility]     Right C7-T1 ESI 10/06/23    Return in about 11 days (around 11/15/2023) for Right C-ESI #2, Right Occipital NB #1, in clinic [facility].    Recent Visits Date Type Provider Dept  10/06/23 Procedure visit Marcelino Nurse, MD [facility]  09/21/23 Office Visit Marcelino Nurse, MD [facility]  Showing recent visits within past 90 days and meeting all other requirements Today's Visits Date Type Provider Dept  11/04/23 Office Visit Marcelino Nurse, MD [facility]  Showing today's visits and meeting all other requirements Future Appointments Date Type Provider Dept  11/15/23 Appointment Marcelino Nurse, MD [facility]  Showing future appointments within next 90 days and meeting all other requirements  I discussed the assessment and treatment plan with the patient. The patient was provided an opportunity to ask questions and all were answered. The patient agreed with the plan and demonstrated an understanding of the instructions.  Patient advised to call back or seek an in-person evaluation if the symptoms or condition worsens.  Duration of encounter: .  Total time on encounter, as per AMA guidelines included both the face-to-face and non-face-to-face time personally spent by the physician and/or other qualified health care professional(s) on the day of the encounter (includes time in activities that require the physician or other qualified health care professional and does not include time in activities normally performed by clinical staff). Physician's time may include the following activities when performed: Preparing to see the patient (e.g., pre-charting review of records, searching for previously ordered imaging, lab work, and nerve conduction tests) Review of prior analgesic pharmacotherapies. Reviewing PMP Interpreting ordered tests (e.g., lab work, imaging, nerve conduction tests) Performing post-procedure evaluations, including interpretation of diagnostic procedures Obtaining and/or reviewing separately obtained history Performing a medically appropriate examination and/or evaluation Counseling and educating the patient/family/caregiver Ordering medications, tests, or procedures Referring and communicating with other health care  professionals (when not separately reported) Documenting clinical information in the electronic or other health record Independently interpreting results (not separately reported) and communicating results to the patient/ family/caregiver Care coordination (not separately reported)  Note by: Nurse Marcelino, MD (TTS and AI technology used. I apologize for any typographical errors that were not detected and corrected.) Date: 11/04/2023; Time: 12:08 PM

## 2023-11-04 NOTE — Progress Notes (Signed)
 Safety precautions to be maintained throughout the outpatient stay will include: orient to surroundings, keep bed in low position, maintain call bell within reach at all times, provide assistance with transfer out of bed and ambulation.

## 2023-11-04 NOTE — Patient Instructions (Signed)
 GENERAL RISKS AND COMPLICATIONS  What are the risk, side effects and possible complications? Generally speaking, most procedures are safe.  However, with any procedure there are risks, side effects, and the possibility of complications.  The risks and complications are dependent upon the sites that are lesioned, or the type of nerve block to be performed.  The closer the procedure is to the spine, the more serious the risks are.  Great care is taken when placing the radio frequency needles, block needles or lesioning probes, but sometimes complications can occur. Infection: Any time there is an injection through the skin, there is a risk of infection.  This is why sterile conditions are used for these blocks.  There are four possible types of infection. Localized skin infection. Central Nervous System Infection-This can be in the form of Meningitis, which can be deadly. Epidural Infections-This can be in the form of an epidural abscess, which can cause pressure inside of the spine, causing compression of the spinal cord with subsequent paralysis. This would require an emergency surgery to decompress, and there are no guarantees that the patient would recover from the paralysis. Discitis-This is an infection of the intervertebral discs.  It occurs in about 1% of discography procedures.  It is difficult to treat and it may lead to surgery.        2. Pain: the needles have to go through skin and soft tissues, will cause soreness.       3. Damage to internal structures:  The nerves to be lesioned may be near blood vessels or    other nerves which can be potentially damaged.       4. Bleeding: Bleeding is more common if the patient is taking blood thinners such as  aspirin, Coumadin, Ticiid, Plavix, etc., or if he/she have some genetic predisposition  such as hemophilia. Bleeding into the spinal canal can cause compression of the spinal  cord with subsequent paralysis.  This would require an emergency  surgery to  decompress and there are no guarantees that the patient would recover from the  paralysis.       5. Pneumothorax:  Puncturing of a lung is a possibility, every time a needle is introduced in  the area of the chest or upper back.  Pneumothorax refers to free air around the  collapsed lung(s), inside of the thoracic cavity (chest cavity).  Another two possible  complications related to a similar event would include: Hemothorax and Chylothorax.   These are variations of the Pneumothorax, where instead of air around the collapsed  lung(s), you may have blood or chyle, respectively.       6. Spinal headaches: They may occur with any procedures in the area of the spine.       7. Persistent CSF (Cerebro-Spinal Fluid) leakage: This is a rare problem, but may occur  with prolonged intrathecal or epidural catheters either due to the formation of a fistulous  track or a dural tear.       8. Nerve damage: By working so close to the spinal cord, there is always a possibility of  nerve damage, which could be as serious as a permanent spinal cord injury with  paralysis.       9. Death:  Although rare, severe deadly allergic reactions known as Anaphylactic  reaction can occur to any of the medications used.      10. Worsening of the symptoms:  We can always make thing worse.  What are the chances  of something like this happening? Chances of any of this occuring are extremely low.  By statistics, you have more of a chance of getting killed in a motor vehicle accident: while driving to the hospital than any of the above occurring .  Nevertheless, you should be aware that they are possibilities.  In general, it is similar to taking a shower.  Everybody knows that you can slip, hit your head and get killed.  Does that mean that you should not shower again?  Nevertheless always keep in mind that statistics do not mean anything if you happen to be on the wrong side of them.  Even if a procedure has a 1 (one) in a  1,000,000 (million) chance of going wrong, it you happen to be that one..Also, keep in mind that by statistics, you have more of a chance of having something go wrong when taking medications.  Who should not have this procedure? If you are on a blood thinning medication (e.g. Coumadin, Plavix, see list of Blood Thinners), or if you have an active infection going on, you should not have the procedure.  If you are taking any blood thinners, please inform your physician.  How should I prepare for this procedure? Do not eat or drink anything at least six hours prior to the procedure. Bring a driver with you .  It cannot be a taxi. Come accompanied by an adult that can drive you back, and that is strong enough to help you if your legs get weak or numb from the local anesthetic. Take all of your medicines the morning of the procedure with just enough water  to swallow them. If you have diabetes, make sure that you are scheduled to have your procedure done first thing in the morning, whenever possible. If you have diabetes, take only half of your insulin dose and notify our nurse that you have done so as soon as you arrive at the clinic. If you are diabetic, but only take blood sugar pills (oral hypoglycemic), then do not take them on the morning of your procedure.  You may take them after you have had the procedure. Do not take aspirin or any aspirin-containing medications, at least eleven (11) days prior to the procedure.  They may prolong bleeding. Wear loose fitting clothing that may be easy to take off and that you would not mind if it got stained with Betadine or blood. Do not wear any jewelry or perfume Remove any nail coloring.  It will interfere with some of our monitoring equipment.  NOTE: Remember that this is not meant to be interpreted as a complete list of all possible complications.  Unforeseen problems may occur.  BLOOD THINNERS The following drugs contain aspirin or other products,  which can cause increased bleeding during surgery and should not be taken for 2 weeks prior to and 1 week after surgery.  If you should need take something for relief of minor pain, you may take acetaminophen  which is found in Tylenol ,m Datril, Anacin-3 and Panadol. It is not blood thinner. The products listed below are.  Do not take any of the products listed below in addition to any listed on your instruction sheet.  A.P.C or A.P.C with Codeine Codeine Phosphate Capsules #3 Ibuprofen Ridaura  ABC compound Congesprin Imuran rimadil  Advil Cope Indocin Robaxisal  Alka-Seltzer Effervescent Pain Reliever and Antacid Coricidin or Coricidin-D  Indomethacin Rufen  Alka-Seltzer plus Cold Medicine Cosprin Ketoprofen S-A-C Tablets  Anacin Analgesic Tablets or Capsules Coumadin  Korlgesic Salflex  Anacin Extra Strength Analgesic tablets or capsules CP-2 Tablets Lanoril Salicylate  Anaprox Cuprimine Capsules Levenox Salocol  Anexsia-D Dalteparin Magan Salsalate  Anodynos Darvon compound Magnesium Salicylate Sine-off  Ansaid Dasin Capsules Magsal Sodium Salicylate  Anturane Depen Capsules Marnal Soma  APF Arthritis pain formula Dewitt's Pills Measurin Stanback  Argesic Dia-Gesic Meclofenamic Sulfinpyrazone  Arthritis Bayer Timed Release Aspirin Diclofenac Meclomen Sulindac  Arthritis pain formula Anacin Dicumarol Medipren Supac  Analgesic (Safety coated) Arthralgen Diffunasal Mefanamic Suprofen  Arthritis Strength Bufferin Dihydrocodeine Mepro Compound Suprol  Arthropan liquid Dopirydamole Methcarbomol with Aspirin Synalgos  ASA tablets/Enseals Disalcid Micrainin Tagament  Ascriptin Doan's Midol Talwin  Ascriptin A/D Dolene Mobidin Tanderil  Ascriptin Extra Strength Dolobid Moblgesic Ticlid  Ascriptin with Codeine Doloprin or Doloprin with Codeine Momentum Tolectin  Asperbuf Duoprin Mono-gesic Trendar  Aspergum Duradyne Motrin or Motrin IB Triminicin  Aspirin plain, buffered or enteric coated  Durasal Myochrisine Trigesic  Aspirin Suppositories Easprin Nalfon Trillsate  Aspirin with Codeine Ecotrin Regular or Extra Strength Naprosyn Uracel  Atromid-S Efficin Naproxen Ursinus  Auranofin Capsules Elmiron Neocylate Vanquish  Axotal Emagrin Norgesic Verin  Azathioprine Empirin or Empirin with Codeine Normiflo Vitamin E  Azolid Emprazil Nuprin Voltaren  Bayer Aspirin plain, buffered or children's or timed BC Tablets or powders Encaprin Orgaran Warfarin Sodium  Buff-a-Comp Enoxaparin Orudis Zorpin  Buff-a-Comp with Codeine Equegesic Os-Cal-Gesic   Buffaprin Excedrin plain, buffered or Extra Strength Oxalid   Bufferin Arthritis Strength Feldene Oxphenbutazone   Bufferin plain or Extra Strength Feldene Capsules Oxycodone with Aspirin   Bufferin with Codeine Fenoprofen Fenoprofen Pabalate or Pabalate-SF   Buffets II Flogesic Panagesic   Buffinol plain or Extra Strength Florinal or Florinal with Codeine Panwarfarin   Buf-Tabs Flurbiprofen Penicillamine   Butalbital  Compound Four-way cold tablets Penicillin   Butazolidin Fragmin Pepto-Bismol   Carbenicillin Geminisyn Percodan   Carna Arthritis Reliever Geopen Persantine   Carprofen Gold's salt Persistin   Chloramphenicol Goody's Phenylbutazone   Chloromycetin Haltrain Piroxlcam   Clmetidine heparin Plaquenil   Cllnoril Hyco-pap Ponstel   Clofibrate Hydroxy chloroquine Propoxyphen         Before stopping any of these medications, be sure to consult the physician who ordered them.  Some, such as Coumadin (Warfarin) are ordered to prevent or treat serious conditions such as deep thrombosis, pumonary embolisms, and other heart problems.  The amount of time that you may need off of the medication may also vary with the medication and the reason for which you were taking it.  If you are taking any of these medications, please make sure you notify your pain physician before you undergo any procedures.         Epidural Steroid  Injection Patient Information  Description: The epidural space surrounds the nerves as they exit the spinal cord.  In some patients, the nerves can be compressed and inflamed by a bulging disc or a tight spinal canal (spinal stenosis).  By injecting steroids into the epidural space, we can bring irritated nerves into direct contact with a potentially helpful medication.  These steroids act directly on the irritated nerves and can reduce swelling and inflammation which often leads to decreased pain.  Epidural steroids may be injected anywhere along the spine and from the neck to the low back depending upon the location of your pain.   After numbing the skin with local anesthetic (like Novocaine), a small needle is passed into the epidural space slowly.  You may experience a sensation of pressure while this  is being done.  The entire block usually last less than 10 minutes.  Conditions which may be treated by epidural steroids:  Low back and leg pain Neck and arm pain Spinal stenosis Post-laminectomy syndrome Herpes zoster (shingles) pain Pain from compression fractures  Preparation for the injection:  Do not eat any solid food or dairy products within 8 hours of your appointment.  You may drink clear liquids up to 3 hours before appointment.  Clear liquids include water , black coffee, juice or soda.  No milk or cream please. You may take your regular medication, including pain medications, with a sip of water  before your appointment  Diabetics should hold regular insulin (if taken separately) and take 1/2 normal NPH dos the morning of the procedure.  Carry some sugar containing items with you to your appointment. A driver must accompany you and be prepared to drive you home after your procedure.  Bring all your current medications with your. An IV may be inserted and sedation may be given at the discretion of the physician.   A blood pressure cuff, EKG and other monitors will often be applied  during the procedure.  Some patients may need to have extra oxygen administered for a short period. You will be asked to provide medical information, including your allergies, prior to the procedure.  We must know immediately if you are taking blood thinners (like Coumadin/Warfarin)  Or if you are allergic to IV iodine contrast (dye). We must know if you could possible be pregnant.  Possible side-effects: Bleeding from needle site Infection (rare, may require surgery) Nerve injury (rare) Numbness & tingling (temporary) Difficulty urinating (rare, temporary) Spinal headache ( a headache worse with upright posture) Light -headedness (temporary) Pain at injection site (several days) Decreased blood pressure (temporary) Weakness in arm/leg (temporary) Pressure sensation in back/neck (temporary)  Call if you experience: Fever/chills associated with headache or increased back/neck pain. Headache worsened by an upright position. New onset weakness or numbness of an extremity below the injection site Hives or difficulty breathing (go to the emergency room) Inflammation or drainage at the infection site Severe back/neck pain Any new symptoms which are concerning to you  Please note:  Although the local anesthetic injected can often make your back or neck feel good for several hours after the injection, the pain will likely return.  It takes 3-7 days for steroids to work in the epidural space.  You may not notice any pain relief for at least that one week.  If effective, we will often do a series of three injections spaced 3-6 weeks apart to maximally decrease your pain.  After the initial series, we generally will wait several months before considering a repeat injection of the same type.  If you have any questions, please call [phone] [facility] Clinic[facility] Patient Information  Description: The occipital nerves originate in the cervical  (neck) spinal cord and travel upward through muscle and tissue to supply sensation to the back of the head and top of the scalp.  In addition, the nerves control some of the muscles of the scalp.  Occipital neuralgia is an irritation of these nerves which can cause headaches, numbness of the scalp, and neck discomfort.     The occipital nerve block will interrupt nerve transmission through these nerves and can relieve pain and spasm.  The block consists of insertion of a small needle under the skin in the back of the head to deposit local anesthetic (  numbing medicine) and/or steroids around the nerve.  The entire block usually lasts less than 5 minutes.  Conditions which may be treated by occipital blocks:  Muscular pain and spasm of the scalp Nerve irritation, back of the head Headaches Upper neck pain  Preparation for the injection:  Do not eat any solid food or dairy products within 8 hours of your appointment. You may drink clear liquids up to 3 hours before appointment.  Clear liquids include water , black coffee, juice or soda.  No milk or cream please. You may take your regular medication, including pain medications, with a sip of water  before you appointment.  Diabetics should hold regular insulin (if taken separately) and take 1/2 normal NPH dose the morning of the procedure.  Carry some sugar containing items with you to your appointment. A driver must accompany you and be prepared to drive you home after your procedure. Bring all your current medications with you. An IV may be inserted and sedation may be given at the discretion of the physician. A blood pressure cuff, EKG, and other monitors will often be applied during the procedure.  Some patients may need to have extra oxygen administered for a short period. You will be asked to provide medical information, including your allergies and medications, prior to the procedure.  We must know immediately if you are taking blood thinners  (like Coumadin/Warfarin) or if you are allergic to IV iodine contrast (dye).  We must know if you could possible be pregnant.  Do not wear a high collared shirt or turtleneck.  Tie long hair up in the back if possible.  Possible side-effects:  Bleeding from needle site Infection (rare, may require surgery) Nerve injury (rare) Hair on back of neck can be tinged with iodine scrub (this will wash out) Light-headedness (temporary) Pain at injection site (several days) Decreased blood pressure (rare, temporary) Seizure (very rare)  Call if you experience:  Hives or difficulty breathing ( go to the emergency room) Inflammation or drainage at the injection site(s)  Please note:  Although the local anesthetic injected can often make your painful muscles or headache feel good for several hours after the injection, the pain may return.  It takes 3-7 days for steroids to work.  You may not notice any pain relief for at least one week.  If effective, we will often do a series of injections spaced 3-6 weeks apart to maximally decrease your pain.  If you have any questions, please call [phone] [facility]

## 2023-11-05 ENCOUNTER — Other Ambulatory Visit: Payer: Self-pay | Admitting: Family Medicine

## 2023-11-05 DIAGNOSIS — I10 Essential (primary) hypertension: Secondary | ICD-10-CM

## 2023-11-05 NOTE — Telephone Encounter (Signed)
 Requested Prescriptions  Pending Prescriptions Disp Refills   valsartan  (DIOVAN ) 160 MG tablet [Pharmacy Med Name: VALSARTAN  160 MG TABLET] 90 tablet 1    Sig: TAKE 1 TABLET BY MOUTH EVERY DAY     Cardiovascular:  Angiotensin Receptor Blockers Passed - 11/05/2023  5:47 PM      Passed - Cr in normal range and within 180 days    Creat  Date Value Ref Range Status  10/01/2023 1.23 0.70 - 1.30 mg/dL Final         Passed - K in normal range and within 180 days    Potassium  Date Value Ref Range Status  10/01/2023 4.6 3.5 - 5.3 mmol/L Final         Passed - Patient is not pregnant      Passed - Last BP in normal range    BP Readings from Last 1 Encounters:  11/04/23 124/78         Passed - Valid encounter within last 6 months    Recent Outpatient Visits           1 month ago Pure hypercholesterolemia   [place] [facility] Duanne Butler DASEN, MD   3 months ago DDD (degenerative disc disease), cervical   [place] [facility] Duanne Butler DASEN, MD   7 months ago General medical exam   [place] Duanne Butler DASEN, MD   1 year ago General medical exam   [place] [facility] [place], Butler DASEN, MD

## 2023-11-15 ENCOUNTER — Ambulatory Visit
Admission: RE | Admit: 2023-11-15 | Discharge: 2023-11-15 | Disposition: A | Discharge status: Home / Self Care | Source: Ambulatory Visit | Attending: Student in an Organized Health Care Education/Training Program | Admitting: Student in an Organized Health Care Education/Training Program

## 2023-11-15 ENCOUNTER — Ambulatory Visit (HOSPITAL_BASED_OUTPATIENT_CLINIC_OR_DEPARTMENT_OTHER): Admitting: Student in an Organized Health Care Education/Training Program

## 2023-11-15 VITALS — BP 140/94 | HR 59 | Temp 98.0°F | Resp 18 | Ht 72.0 in | Wt 210.0 lb

## 2023-11-15 DIAGNOSIS — G894 Chronic pain syndrome: Secondary | ICD-10-CM

## 2023-11-15 DIAGNOSIS — M5412 Radiculopathy, cervical region: Secondary | ICD-10-CM | POA: Diagnosis not present

## 2023-11-15 DIAGNOSIS — M4802 Spinal stenosis, cervical region: Secondary | ICD-10-CM

## 2023-11-15 DIAGNOSIS — M5481 Occipital neuralgia: Secondary | ICD-10-CM

## 2023-11-15 DIAGNOSIS — M542 Cervicalgia: Secondary | ICD-10-CM | POA: Diagnosis not present

## 2023-11-15 MED ORDER — DEXAMETHASONE SODIUM PHOSPHATE 10 MG/ML IJ SOLN
INTRAMUSCULAR | Status: AC
Start: 2023-11-15 — End: 2023-11-15
  Filled 2023-11-15: qty 1

## 2023-11-15 MED ORDER — SODIUM CHLORIDE (PF) 0.9 % IJ SOLN
INTRAMUSCULAR | Status: AC
  Filled 2023-11-15: qty 10

## 2023-11-15 MED ORDER — IOHEXOL 180 MG/ML  SOLN
INTRAMUSCULAR | Status: AC
  Filled 2023-11-15: qty 20

## 2023-11-15 MED ORDER — DEXAMETHASONE SODIUM PHOSPHATE 10 MG/ML IJ SOLN
10.0000 mg | Freq: Once | INTRAMUSCULAR | Status: AC
  Administered 2023-11-15 (×2): 10 mg

## 2023-11-15 MED ORDER — LIDOCAINE HCL (PF) 2 % IJ SOLN
INTRAMUSCULAR | Status: AC
  Filled 2023-11-15: qty 10

## 2023-11-15 MED ORDER — SODIUM CHLORIDE 0.9% FLUSH
1.0000 mL | Freq: Once | INTRAVENOUS | Status: AC
  Administered 2023-11-15 (×2): 1 mL

## 2023-11-15 MED ORDER — ROPIVACAINE HCL 2 MG/ML IJ SOLN
1.0000 mL | Freq: Once | INTRAMUSCULAR | Status: AC
  Administered 2023-11-15 (×2): 1 mL via EPIDURAL

## 2023-11-15 MED ORDER — LIDOCAINE HCL 2 % IJ SOLN
20.0000 mL | Freq: Once | INTRAMUSCULAR | Status: AC
  Administered 2023-11-15 (×2): 100 mg

## 2023-11-15 MED ORDER — IOHEXOL 180 MG/ML  SOLN
10.0000 mL | Freq: Once | INTRAMUSCULAR | Status: AC
  Administered 2023-11-15 (×2): 10 mL via EPIDURAL

## 2023-11-15 MED ORDER — ROPIVACAINE HCL 2 MG/ML IJ SOLN
INTRAMUSCULAR | Status: AC
  Filled 2023-11-15: qty 20

## 2023-11-15 MED ORDER — DEXAMETHASONE SODIUM PHOSPHATE 10 MG/ML IJ SOLN
INTRAMUSCULAR | Status: AC
  Filled 2023-11-15: qty 1

## 2023-11-15 NOTE — Progress Notes (Signed)
 PROVIDER NOTE: Interpretation of information contained herein should be left to medically-trained personnel. Specific patient instructions are provided elsewhere under Patient Instructions section of medical record. This document was created in part using STT-dictation technology, any transcriptional errors that may result from this process are unintentional.  Patient: Darryl Ross Type: Established DOB: [birth year] MRN: [id number] PCP: Duanne Butler DASEN, MD  Service: Procedure DOS: 11/15/2023 Setting: Ambulatory Location: Ambulatory outpatient facility Delivery: Face-to-face Provider: Wallie Sherry, MD Specialty: Interventional Pain Management Specialty designation: 09 Location: Outpatient facility Ref. Prov.: Duanne Butler DASEN, MD       Interventional Therapy   Type: Cervical Epidural Steroid injection (CESI) (Interlaminar) #2  Laterality: Right (-RT)  Level: C7-T1 DOS: 11/15/2023  Provider: Wallie Sherry, MD Imaging: Fluoroscopy-guided Spinal ([id number]) Anesthesia: Local anesthesia (1-2% Lidocaine )  Medical Necessity Purpose: Diagnostic/Therapeutic Rationale (medical necessity): procedure needed and proper for the diagnosis and/or treatment of Mr. Zuelke's medical symptoms and needs. Indications: Cervicalgia, cervical radicular pain, degenerative disc disease, severe enough to impact quality of life or function. 1. Cervical radicular pain   2. Foraminal stenosis of cervical region   3. Cervico-occipital neuralgia   4. Chronic pain syndrome    NAS-11 Pain score:   Pre-procedure: 4 /10   Post-procedure: 4 /10     Position  Prep  Materials:  Location setting: Procedure suite Position: Prone, on modified reverse trendelenburg to facilitate breathing, with head in head-cradle. Pillows positioned under chest (below chin-level) with cervical spine flexed. Safety Precautions: Patient was assessed for positional comfort and pressure points before starting the  procedure. Prepping solution: DuraPrep (Iodine Povacrylex [0.7% available iodine] and Isopropyl Alcohol, 74% w/w) Prep Area: Entire  cervicothoracic region Approach: percutaneous, paramedial Intended target: Posterior cervical epidural space Materials Procedure:  Tray: Epidural Needle(s): Epidural (Tuohy) Qty: 1 Length: (90mm) 3.5-inch Gauge: 22G  H&P (Pre-op Assessment):  Mr. Labonte is a 58 y.o. (year old), male patient, seen today for interventional treatment. He  has a past surgical history that includes Hand surgery and Colonoscopy with propofol  (N/A, 03/04/2015). Mr. Heinlen has a current medication list which includes the following prescription(s): alprazolam , aspirin, budesonide , glucosamine-chondroitin, meclizine , metoprolol  succinate, multivitamin, fish oil, paroxetine , rizatriptan , rosuvastatin , and valsartan . His primarily concern today is the Neck Pain  Initial Vital Signs:  Pulse/HCG Rate: (!) 59ECG Heart Rate: (!) 56 Temp: 98 F (36.7 C) Resp: 16 BP: 127/89 SpO2: 100 %  BMI: Estimated body mass index is 28.48 kg/m as calculated from the following:   Height as of this encounter: 6' (1.829 m).   Weight as of this encounter: 210 lb (95.3 kg).  Risk Assessment: Allergies: Reviewed. He is allergic to morphine and codeine, statins, and zetia  [ezetimibe ].  Allergy Precautions: None required Coagulopathies: Reviewed. None identified.  Blood-thinner therapy: None at this time Active Infection(s): Reviewed. None identified. Mr. Fassnacht is afebrile  Site Confirmation: Mr. Torregrossa was asked to confirm the procedure and laterality before marking the site Procedure checklist: Completed Consent: Before the procedure and under the influence of no sedative(s), amnesic(s), or anxiolytics, the patient was informed of the treatment options, risks and possible complications. To fulfill our ethical and legal obligations, as recommended by the American Medical Association's Code of  Ethics, I have informed the patient of my clinical impression; the nature and purpose of the treatment or procedure; the risks, benefits, and possible complications of the intervention; the alternatives, including doing nothing; the risk(s) and benefit(s) of the alternative treatment(s) or procedure(s); and the risk(s) and benefit(s) of doing nothing. The  patient was provided information about the general risks and possible complications associated with the procedure. These may include, but are not limited to: failure to achieve desired goals, infection, bleeding, organ or nerve damage, allergic reactions, paralysis, and death. In addition, the patient was informed of those risks and complications associated to Spine-related procedures, such as failure to decrease pain; infection (i.e.: Meningitis, epidural or intraspinal abscess); bleeding (i.e.: epidural hematoma, subarachnoid hemorrhage, or any other type of intraspinal or peri-dural bleeding); organ or nerve damage (i.e.: Any type of peripheral nerve, nerve root, or spinal cord injury) with subsequent damage to sensory, motor, and/or autonomic systems, resulting in permanent pain, numbness, and/or weakness of one or several areas of the body; allergic reactions; (i.e.: anaphylactic reaction); and/or death. Furthermore, the patient was informed of those risks and complications associated with the medications. These include, but are not limited to: allergic reactions (i.e.: anaphylactic or anaphylactoid reaction(s)); adrenal axis suppression; blood sugar elevation that in diabetics may result in ketoacidosis or comma; water  retention that in patients with history of congestive heart failure may result in shortness of breath, pulmonary edema, and decompensation with resultant heart failure; weight gain; swelling or edema; medication-induced neural toxicity; particulate matter embolism and blood vessel occlusion with resultant organ, and/or nervous system  infarction; and/or aseptic necrosis of one or more joints. Finally, the patient was informed that Medicine is not an exact science; therefore, there is also the possibility of unforeseen or unpredictable risks and/or possible complications that may result in a catastrophic outcome. The patient indicated having understood very clearly. We have given the patient no guarantees and we have made no promises. Enough time was given to the patient to ask questions, all of which were answered to the patient's satisfaction. Mr. Krogh has indicated that he wanted to continue with the procedure. Attestation: I, the ordering provider, attest that I have discussed with the patient the benefits, risks, side-effects, alternatives, likelihood of achieving goals, and potential problems during recovery for the procedure that I have provided informed consent. Date  Time: 11/15/2023  8:53 AM  Pre-Procedure Preparation:  Monitoring: As per clinic protocol. Respiration, ETCO2, SpO2, BP, heart rate and rhythm monitor placed and checked for adequate function Safety Precautions: Patient was assessed for positional comfort and pressure points before starting the procedure. Time-out: I initiated and conducted the Time-out before starting the procedure, as per protocol. The patient was asked to participate by confirming the accuracy of the Time Out information. Verification of the correct person, site, and procedure were performed and confirmed by me, the nursing staff, and the patient. Time-out conducted as per Joint Commission's Universal Protocol (UP.01.01.01). Time: 0946 Start Time: 0946 hrs.  Description  Narrative of Procedure:          Rationale (medical necessity): procedure needed and proper for the diagnosis and/or treatment of the patient's medical symptoms and needs. Start Time: 0946 hrs. Safety Precautions: Aspiration looking for blood return was conducted prior to all injections. At no point did we inject  any substances, as a needle was being advanced. No attempts were made at seeking any paresthesias. Safe injection practices and needle disposal techniques used. Medications properly checked for expiration dates. SDV (single dose vial) medications used. Description of procedure: Protocol guidelines were followed. The patient was assisted into a comfortable position. The target area was identified and the area prepped in the usual manner. Skin & deeper tissues infiltrated with local anesthetic. Appropriate amount of time allowed to pass for local anesthetics to take  effect. Using fluoroscopic guidance, the epidural needle was introduced through the skin, ipsilateral to the reported pain, and advanced to the target area. Posterior laminar os was contacted and the needle walked caudad, until the lamina was cleared. The ligamentum flavum was engaged and the epidural space identified using "loss-of-resistance technique" with 2-3 ml of PF-NaCl (0.9% NSS), in a 5cc dedicated LOR syringe. (See Imaging guidance below for use of contrast details.) Once proper needle placement was secured, and negative aspiration confirmed, the solution was injected in intermittent fashion, asking for systemic symptoms every 0.5cc. The needles were then removed and the area cleansed, making sure to leave some of the prepping solution back to take advantage of its long term bactericidal properties.  Vitals:   11/15/23 0908 11/15/23 0945 11/15/23 0951  BP: 127/89 123/89 (!) 140/94  Pulse: (!) 59    Resp: 16 18 18   Temp: 98 F (36.7 C)    SpO2: 100% 100% 100%  Weight: 210 lb (95.3 kg)    Height: 6' (1.829 m)       End Time: 0951 hrs.  Imaging Guidance (Spinal):          Type of Imaging Technique: Fluoroscopy Guidance (Spinal) Indication(s): Fluoroscopy guidance for needle placement to enhance accuracy in procedures requiring precise needle localization for targeted delivery of medication in or near specific anatomical  locations not easily accessible without such real-time imaging assistance. Exposure Time: Please see nurses notes. Contrast: Before injecting any contrast, we confirmed that the patient did not have an allergy to iodine, shellfish, or radiological contrast. Once satisfactory needle placement was completed at the desired level, radiological contrast was injected. Contrast injected under live fluoroscopy. No contrast complications. See chart for type and volume of contrast used. Fluoroscopic Guidance: I was personally present during the use of fluoroscopy. Tunnel Vision Technique used to obtain the best possible view of the target area. Parallax error corrected before commencing the procedure. Direction-depth-direction technique used to introduce the needle under continuous pulsed fluoroscopy. Once target was reached, antero-posterior, oblique, and lateral fluoroscopic projection used confirm needle placement in all planes. Images permanently stored in EMR. Interpretation: I personally interpreted the imaging intraoperatively. Adequate needle placement confirmed in multiple planes. Appropriate spread of contrast into desired area was observed. No evidence of afferent or efferent intravascular uptake. No intrathecal or subarachnoid spread observed. Permanent images saved into the patient's record.  Post-operative Assessment:  Post-procedure Vital Signs:  Pulse/HCG Rate: (!) 59(!) 57 Temp: 98 F (36.7 C) Resp: 18 BP: (!) 140/94 SpO2: 100 %  EBL: None  Complications: No immediate post-treatment complications observed by team, or reported by patient.  Note: The patient tolerated the entire procedure well. A repeat set of vitals were taken after the procedure and the patient was kept under observation following institutional policy, for this type of procedure. Post-procedural neurological assessment was performed, showing return to baseline, prior to discharge. The patient was provided with  post-procedure discharge instructions, including a section on how to identify potential problems. Should any problems arise concerning this procedure, the patient was given instructions to immediately contact us , at any time, without hesitation. In any case, we plan to contact the patient by telephone for a follow-up status report regarding this interventional procedure.  Comments:  No additional relevant information.  Plan of Care (POC)  Orders:  Orders Placed This Encounter  Procedures   DG PAIN CLINIC C-ARM 1-60 MIN NO REPORT    Intraoperative interpretation by procedural physician at [facility].  Standing Status:   Standing    Number of Occurrences:   1    Reason for exam::   Assistance in needle guidance and placement for procedures requiring needle placement in or near specific anatomical locations not easily accessible without such assistance.    Medications ordered for procedure: Meds ordered this encounter  Medications   iohexol  (OMNIPAQUE ) 180 MG/ML injection 10 mL    Must be Myelogram-compatible. If not available, you may substitute with a water -soluble, non-ionic, hypoallergenic, myelogram-compatible radiological contrast medium.   lidocaine  (XYLOCAINE ) 2 % (with pres) injection 400 mg   sodium chloride  flush (NS) 0.9 % injection 1 mL   ropivacaine  (PF) 2 mg/mL (0.2%) (NAROPIN ) injection 1 mL   dexamethasone  (DECADRON ) injection 10 mg   dexamethasone  (DECADRON ) injection 10 mg   Medications administered: We administered iohexol , lidocaine , sodium chloride  flush, ropivacaine  (PF) 2 mg/mL (0.2%), dexamethasone , and dexamethasone .  See the medical record for exact dosing, route, and time of administration.    Right C7-T1 ESI 10/06/23, 11/15/23; R GONB 11/15/23    Follow-up plan:   Return in about 4 weeks (around 12/13/2023) for PPE, F2F.     Recent Visits Date Type Provider Dept  11/04/23 Office Visit Marcelino Nurse, MD [facility]  10/06/23 Procedure  visit Marcelino Nurse, MD [facility]  09/21/23 Office Visit Marcelino Nurse, MD [facility]  Showing recent visits within past 90 days and meeting all other requirements Today's Visits Date Type Provider Dept  11/15/23 Procedure visit Marcelino Nurse, MD [facility]  Showing today's visits and meeting all other requirements Future Appointments Date Type Provider Dept  12/14/23 Appointment Marcelino Nurse, MD [facility]  Showing future appointments within next 90 days and meeting all other requirements   Disposition: Discharge home  Discharge (Date  Time): 11/15/2023; 1005 hrs.   Primary Care Physician: Duanne Butler DASEN, MD Location: [facility] Note by: Nurse Marcelino, MD (TTS technology used. I apologize for any typographical errors that were not detected and corrected.) Date: 11/15/2023; Time: 10:13 AM  Disclaimer:  Medicine is not an Visual merchandiser. The only guarantee in medicine is that nothing is guaranteed. It is important to note that the decision to proceed with this intervention was based on the information collected from the patient. The Data and conclusions were drawn from the patient's questionnaire, the interview, and the physical examination. Because the information was provided in large part by the patient, it cannot be guaranteed that it has not been purposely or unconsciously manipulated. Every effort has been made to obtain as much relevant data as possible for this evaluation. It is important to note that the conclusions that lead to this procedure are derived in large part from the available data. Always take into account that the treatment will also be dependent on availability of resources and existing treatment guidelines, considered by other Pain Management Practitioners as being common knowledge and practice, at the time of the intervention. For Medico-Legal purposes, it is also important to point out that  variation in procedural techniques and pharmacological choices are the acceptable norm. The indications, contraindications, technique, and results of the above procedure should only be interpreted and judged by a Board-Certified Interventional Pain Specialist with extensive familiarity and expertise in the same exact procedure and technique.

## 2023-11-15 NOTE — Progress Notes (Signed)
 PROVIDER NOTE: Interpretation of information contained herein should be left to medically-trained personnel. Specific patient instructions are provided elsewhere under Patient Instructions section of medical record. This document was created in part using STT-dictation technology, any transcriptional errors that may result from this process are unintentional.  Patient: Darryl Ross Type: Established DOB: [birth year] MRN: [id number] PCP: Duanne Butler DASEN, MD  Service: Procedure DOS: 11/15/2023 Setting: Ambulatory Location: Ambulatory outpatient facility Delivery: Face-to-face Provider: Wallie Sherry, MD Specialty: Interventional Pain Management Specialty designation: 09 Location: Outpatient facility Ref. Prov.: Duanne Butler DASEN, MD       Interventional Therapy   Primary Reason for Visit: Interventional Pain Management Treatment. CC: Neck Pain    Procedure:          Anesthesia, Analgesia, Anxiolysis:  Type: Diagnostic, Greater, Occipital Nerve Block  #1  Region: Posterolateral Cervical Level: Occipital Ridge   Laterality: Right  Anesthesia: Local (1-2% Lidocaine )  Anxiolysis: None  Sedation: None    Position: Prone   1. Cervical radicular pain   2. Foraminal stenosis of cervical region   3. Cervico-occipital neuralgia   4. Chronic pain syndrome    NAS-11 Pain score:   Pre-procedure: 4 /10   Post-procedure: 4 /10     H&P (Pre-op Assessment):  Darryl Ross is a 58 y.o. (year old), male patient, seen today for interventional treatment. He  has a past surgical history that includes Hand surgery and Colonoscopy with propofol  (N/A, 03/04/2015). Darryl Ross has a current medication list which includes the following prescription(s): alprazolam , aspirin, budesonide , glucosamine-chondroitin, meclizine , metoprolol  succinate, multivitamin, fish oil, paroxetine , rizatriptan , rosuvastatin , and valsartan . His primarily concern today is the Neck Pain  Initial Vital Signs:  Pulse/HCG  Rate: (!) 59ECG Heart Rate: (!) 56 Temp: 98 F (36.7 C) Resp: 16 BP: 127/89 SpO2: 100 %  BMI: Estimated body mass index is 28.48 kg/m as calculated from the following:   Height as of this encounter: 6' (1.829 m).   Weight as of this encounter: 210 lb (95.3 kg).  Risk Assessment: Allergies: Reviewed. He is allergic to morphine and codeine, statins, and zetia  [ezetimibe ].  Allergy Precautions: None required Coagulopathies: Reviewed. None identified.  Blood-thinner therapy: None at this time Active Infection(s): Reviewed. None identified. Darryl Ross is afebrile  Site Confirmation: Darryl Ross was asked to confirm the procedure and laterality before marking the site Procedure checklist: Completed Consent: Before the procedure and under the influence of no sedative(s), amnesic(s), or anxiolytics, the patient was informed of the treatment options, risks and possible complications. To fulfill our ethical and legal obligations, as recommended by the American Medical Association's Code of Ethics, I have informed the patient of my clinical impression; the nature and purpose of the treatment or procedure; the risks, benefits, and possible complications of the intervention; the alternatives, including doing nothing; the risk(s) and benefit(s) of the alternative treatment(s) or procedure(s); and the risk(s) and benefit(s) of doing nothing. The patient was provided information about the general risks and possible complications associated with the procedure. These may include, but are not limited to: failure to achieve desired goals, infection, bleeding, organ or nerve damage, allergic reactions, paralysis, and death. In addition, the patient was informed of those risks and complications associated to the procedure, such as failure to decrease pain; infection; bleeding; organ or nerve damage with subsequent damage to sensory, motor, and/or autonomic systems, resulting in permanent pain, numbness, and/or  weakness of one or several areas of the body; allergic reactions; (i.e.: anaphylactic reaction); and/or death. Furthermore, the  patient was informed of those risks and complications associated with the medications. These include, but are not limited to: allergic reactions (i.e.: anaphylactic or anaphylactoid reaction(s)); adrenal axis suppression; blood sugar elevation that in diabetics may result in ketoacidosis or comma; water  retention that in patients with history of congestive heart failure may result in shortness of breath, pulmonary edema, and decompensation with resultant heart failure; weight gain; swelling or edema; medication-induced neural toxicity; particulate matter embolism and blood vessel occlusion with resultant organ, and/or nervous system infarction; and/or aseptic necrosis of one or more joints. Finally, the patient was informed that Medicine is not an exact science; therefore, there is also the possibility of unforeseen or unpredictable risks and/or possible complications that may result in a catastrophic outcome. The patient indicated having understood very clearly. We have given the patient no guarantees and we have made no promises. Enough time was given to the patient to ask questions, all of which were answered to the patient's satisfaction. Darryl Ross has indicated that he wanted to continue with the procedure. Attestation: I, the ordering provider, attest that I have discussed with the patient the benefits, risks, side-effects, alternatives, likelihood of achieving goals, and potential problems during recovery for the procedure that I have provided informed consent. Date  Time: 11/15/2023  8:53 AM  Pre-Procedure Preparation:  Monitoring: As per clinic protocol. Respiration, ETCO2, SpO2, BP, heart rate and rhythm monitor placed and checked for adequate function Safety Precautions: Patient was assessed for positional comfort and pressure points before starting the  procedure. Time-out: I initiated and conducted the Time-out before starting the procedure, as per protocol. The patient was asked to participate by confirming the accuracy of the Time Out information. Verification of the correct person, site, and procedure were performed and confirmed by me, the nursing staff, and the patient. Time-out conducted as per Joint Commission's Universal Protocol (UP.01.01.01). Time: 0946 Start Time: 0946 hrs.  Description of Procedure:          Target Area: Area medial to the occipital artery at the level of the superior nuchal ridge Approach: Posterior approach Area Prepped: Entire Posterior Occipital Region ChloraPrep (2% chlorhexidine gluconate and 70% isopropyl alcohol) Safety Precautions: Aspiration looking for blood return was conducted prior to all injections. At no point did we inject any substances, as a needle was being advanced. No attempts were made at seeking any paresthesias. Safe injection practices and needle disposal techniques used. Medications properly checked for expiration dates. SDV (single dose vial) medications used. Description of the Procedure: Protocol guidelines were followed. The target area was identified and the area prepped in the usual manner. Skin & deeper tissues infiltrated with local anesthetic. Appropriate amount of time allowed to pass for local anesthetics to take effect. The procedure needles were then advanced to the target area. Proper needle placement secured. Negative aspiration confirmed. Solution injected in intermittent fashion, asking for systemic symptoms every 0.5cc of injectate. The needles were then removed and the area cleansed, making sure to leave some of the prepping solution back to take advantage of its long term bactericidal properties.  Vitals:   11/15/23 0908 11/15/23 0945 11/15/23 0951  BP: 127/89 123/89 (!) 140/94  Pulse: (!) 59    Resp: 16 18 18   Temp: 98 F (36.7 C)    SpO2: 100% 100% 100%   Weight: 210 lb (95.3 kg)    Height: 6' (1.829 m)      Start Time: 0946 hrs. End Time: 0951 hrs. Materials:  Needle(s) Type: Regular  needle Gauge: 25G Length: 1.5-in Medication(s): Please see orders for medications and dosing details. 5cc solution made of 4c of 0.2% ropivacaine , 1 cc of Decadron  10 mg/cc.   Antibiotic Prophylaxis:   Anti-infectives (From admission, onward)    None      Indication(s): None identified  Post-operative Assessment:  Post-procedure Vital Signs:  Pulse/HCG Rate: (!) 59(!) 57 Temp: 98 F (36.7 C) Resp: 18 BP: (!) 140/94 SpO2: 100 %  EBL: None  Complications: No immediate post-treatment complications observed by team, or reported by patient.  Note: The patient tolerated the entire procedure well. A repeat set of vitals were taken after the procedure and the patient was kept under observation following institutional policy, for this type of procedure. Post-procedural neurological assessment was performed, showing return to baseline, prior to discharge. The patient was provided with post-procedure discharge instructions, including a section on how to identify potential problems. Should any problems arise concerning this procedure, the patient was given instructions to immediately contact us , at any time, without hesitation. In any case, we plan to contact the patient by telephone for a follow-up status report regarding this interventional procedure.  Comments:  No additional relevant information.  Plan of Care (POC)  Orders:  Orders Placed This Encounter  Procedures   DG PAIN [facility] C-ARM 1-60 MIN NO REPORT    Intraoperative interpretation by procedural physician at [facility].    Standing Status:   Standing    Number of Occurrences:   1    Reason for exam::   Assistance in needle guidance and placement for procedures requiring needle placement in or near specific anatomical locations not easily accessible without such assistance.      Medications ordered for procedure: Meds ordered this encounter  Medications   iohexol  (OMNIPAQUE ) 180 MG/ML injection 10 mL    Must be Myelogram-compatible. If not available, you may substitute with a water -soluble, non-ionic, hypoallergenic, myelogram-compatible radiological contrast medium.   lidocaine  (XYLOCAINE ) 2 % (with pres) injection 400 mg   sodium chloride  flush (NS) 0.9 % injection 1 mL   ropivacaine  (PF) 2 mg/mL (0.2%) (NAROPIN ) injection 1 mL   dexamethasone  (DECADRON ) injection 10 mg   dexamethasone  (DECADRON ) injection 10 mg   Medications administered: We administered iohexol , lidocaine , sodium chloride  flush, ropivacaine  (PF) 2 mg/mL (0.2%), dexamethasone , and dexamethasone .  See the medical record for exact dosing, route, and time of administration.   Follow-up plan:   Return in about 4 weeks (around 12/13/2023) for PPE, F2F.     Recent Visits Date Type Provider Dept  11/04/23 Office Visit Marcelino Nurse, MD [facility]  10/06/23 Procedure visit Marcelino Nurse, MD [facility]  09/21/23 Office Visit Marcelino Nurse, MD [facility]  Showing recent visits within past 90 days and meeting all other requirements Today's Visits Date Type Provider Dept  11/15/23 Procedure visit Marcelino Nurse, MD [facility]  Showing today's visits and meeting all other requirements Future Appointments Date Type Provider Dept  12/14/23 Appointment Marcelino Nurse, MD [facility]  Showing future appointments within next 90 days and meeting all other requirements   Disposition: Discharge home  Discharge (Date  Time): 11/15/2023; 1005 hrs.   Primary Care Physician: Duanne Butler DASEN, MD Location: [facility] Note by: Nurse Marcelino, MD (TTS technology used. I apologize for any typographical errors that were not detected and corrected.) Date: 11/15/2023; Time: 10:14 AM  Disclaimer:  Medicine is not an Doctor, general practice. The only guarantee in  medicine is that nothing is guaranteed. It is important to note that the decision to proceed with this intervention was based on the information collected from the patient. The Data and conclusions were drawn from the patient's questionnaire, the interview, and the physical examination. Because the information was provided in large part by the patient, it cannot be guaranteed that it has not been purposely or unconsciously manipulated. Every effort has been made to obtain as much relevant data as possible for this evaluation. It is important to note that the conclusions that lead to this procedure are derived in large part from the available data. Always take into account that the treatment will also be dependent on availability of resources and existing treatment guidelines, considered by other Pain Management Practitioners as being common knowledge and practice, at the time of the intervention. For Medico-Legal purposes, it is also important to point out that variation in procedural techniques and pharmacological choices are the acceptable norm. The indications, contraindications, technique, and results of the above procedure should only be interpreted and judged by a Board-Certified Interventional Pain Specialist with extensive familiarity and expertise in the same exact procedure and technique.

## 2023-11-15 NOTE — Patient Instructions (Signed)

## 2023-11-16 ENCOUNTER — Telehealth: Payer: Self-pay | Admitting: *Deleted

## 2023-11-16 NOTE — Telephone Encounter (Signed)
 Post procedure call; voicemail left

## 2023-12-02 ENCOUNTER — Other Ambulatory Visit: Payer: Self-pay | Admitting: Family Medicine

## 2023-12-12 ENCOUNTER — Other Ambulatory Visit: Payer: Self-pay | Admitting: Family Medicine

## 2023-12-14 ENCOUNTER — Encounter: Payer: Self-pay | Admitting: Student in an Organized Health Care Education/Training Program

## 2023-12-14 ENCOUNTER — Ambulatory Visit
Attending: Student in an Organized Health Care Education/Training Program | Admitting: Student in an Organized Health Care Education/Training Program

## 2023-12-14 VITALS — BP 129/93 | HR 59 | Temp 97.2°F | Resp 16 | Ht 72.0 in | Wt 210.0 lb

## 2023-12-14 DIAGNOSIS — M4802 Spinal stenosis, cervical region: Secondary | ICD-10-CM | POA: Diagnosis not present

## 2023-12-14 DIAGNOSIS — G894 Chronic pain syndrome: Secondary | ICD-10-CM | POA: Diagnosis present

## 2023-12-14 DIAGNOSIS — M5412 Radiculopathy, cervical region: Secondary | ICD-10-CM | POA: Diagnosis present

## 2023-12-14 DIAGNOSIS — M5481 Occipital neuralgia: Secondary | ICD-10-CM | POA: Diagnosis not present

## 2023-12-14 NOTE — Progress Notes (Signed)
 PROVIDER NOTE: Interpretation of information contained herein should be left to medically-trained personnel. Specific patient instructions are provided elsewhere under Patient Instructions section of medical record. This document was created in part using AI and STT-dictation technology, any transcriptional errors that may result from this process are unintentional.  Patient: Darryl Ross  Service: E/M   PCP: Duanne Butler DASEN, MD  DOB: [birth year]  DOS: 12/14/2023  Provider: Wallie Sherry, MD  MRN: [id number]  Delivery: Face-to-face  Specialty: Interventional Pain Management  Type: Established Patient  Setting: Ambulatory outpatient facility  Specialty designation: 09  Referring Prov.: Duanne Butler DASEN, MD  Location: Outpatient office facility       History of present illness (HPI) [name], a 58 y.o. year old male, is here today because of his Cervical radicular pain [M54.12]. Mr. Buttler's primary complain today is Neck Pain (Bilateral right is worse ) and Headache (Posterior  )  Pertinent problems: Mr. Fjeld does not have any pertinent problems on file.  Pain Assessment: Severity of Chronic pain is reported as a 5 /10. Location: Neck (posterior headaches) Left, Right/up the back of the head and into right shoulder,  first shot helped a little bit but the right shoulder pain has returned. Onset: More than a month ago. Quality: Aching, Discomfort, Nagging, Radiating, Headache. Timing: Intermittent. Modifying factor(s): medications and rest, stretching. Vitals:  height is 6' (1.829 m) and weight is 210 lb (95.3 kg). His temporal temperature is 97.2 F (36.2 C) (abnormal). His blood pressure is 129/93 (abnormal) and his pulse is 59 (abnormal). His respiration is 16 and oxygen saturation is 100%.  BMI: Estimated body mass index is 28.48 kg/m as calculated from the following:   Height as of this encounter: 6' (1.829 m).   Weight as of this encounter: 210 lb (95.3 kg).  Last encounter:  11/04/2023. Last procedure: 11/15/2023.  Reason for encounter:   History of Present Illness   Darryl Ross is a 58 year old male who presents with worsening neck pain despite prior treatments.  He has persistent neck pain that has not improved with previous treatments, he has 2 series of cervical epidural steroid injections along with a right occipital nerve block which was not helpful for his pain unfortunately.  He is concerned about the impact on his quality of life, particularly with the new symptom of dizziness when he turns his head.  He states that this has become more noticeable while he is driving.  He has a history of shoulder pain that fluctuates, which he believes is related to arthritis. The shoulder pain 'comes and goes' depending on the severity of the arthritis on a given day.  He wants to avoid oral steroids due to their impact on his sleep, as he experienced difficulty sleeping for two nights after a previous neck injection. He is cautious about using steroids again due to these side effects.  His wife has a history of neck surgery with hardware placement, resulting in daily headaches and loss of motion, which influences his perspective on potential surgical interventions.  He states that he is interested in a more minimally invasive approach if he needs to have surgery.      ROS  Constitutional: Denies any fever or chills Gastrointestinal: No reported hemesis, hematochezia, vomiting, or acute GI distress Musculoskeletal: as above Neurological: as above  Medication Review  ALPRAZolam , Fish Oil, Glucosamine-Chondroitin, PARoxetine , aspirin, budesonide , meclizine , metoprolol  succinate, multivitamin, rizatriptan , rosuvastatin , and valsartan   History Review  Allergy: Mr.  Darryl Ross is allergic to morphine and codeine, statins, and zetia  [ezetimibe ]. Drug: [name]  reports no history of drug use. Alcohol:  reports current alcohol use of about 4.0 standard drinks  of alcohol per week. Tobacco:  reports that he has never smoked. He has never used smokeless tobacco. Social: [name]  reports that he has never smoked. He has never used smokeless tobacco. He reports current alcohol use of about 4.0 standard drinks of alcohol per week. He reports that he does not use drugs. Medical:  has a past medical history of Anxiety, Arthritis, Degenerative disc disease, cervical, Hypercholesteremia, Hyperlipidemia, Hypertension, and Lymphocytic colitis. Surgical: Mr. Faulk  has a past surgical history that includes Hand surgery and Colonoscopy with propofol  (N/A, 03/04/2015). Family: family history includes Arrhythmia in his father; Hyperlipidemia in his mother; Hypertension in his father.  Laboratory Chemistry Profile   Renal Lab Results  Component Value Date   BUN 14 10/01/2023   CREATININE 1.23 10/01/2023   BCR SEE NOTE: 10/01/2023   GFRAA 81 03/07/2019   GFRNONAA >60 07/23/2021    Hepatic Lab Results  Component Value Date   AST 21 10/01/2023   ALT 27 10/01/2023   ALBUMIN 4.9 07/23/2021   ALKPHOS 43 07/23/2021   HCVAB NEGATIVE 11/09/2013    Electrolytes Lab Results  Component Value Date   NA 140 10/01/2023   K 4.6 10/01/2023   CL 105 10/01/2023   CALCIUM  9.3 10/01/2023    Bone No results found for: VD25OH, VD125OH2TOT, CI6874NY7, CI7874NY7, 25OHVITD1, 25OHVITD2, 25OHVITD3, TESTOFREE, TESTOSTERONE  Inflammation (CRP: Acute Phase) (ESR: Chronic Phase) Lab Results  Component Value Date   ESRSEDRATE 1 10/29/2016         Note: Above Lab results reviewed.  Recent Imaging Review  CLINICAL DATA:  Provided history: Neck pain, chronic, degenerative changes on x-ray. Cervicalgia.   EXAM: MRI CERVICAL SPINE WITHOUT CONTRAST   TECHNIQUE: Multiplanar, multisequence MR imaging of the cervical spine was performed. No intravenous contrast was administered.   COMPARISON:  Cervical spine MRI 09/30/2017.   FINDINGS: Alignment:  Slight C4-C5 grade 1 anterolisthesis.   Vertebrae: Cervical vertebral body height is maintained. Mild degenerative endplate edema at R5-R4. Degenerative endplate irregularity and mixed degenerative endplate marrow signal at C6-C7 (including mild degenerative endplate edema). Marrow edema within the posterior elements on the left at C3 and on the right at C4-C5, likely degenerative and related to facet arthropathy.   Cord: No signal abnormality identified within the cervical spinal cord.   Posterior Fossa, vertebral arteries, paraspinal tissues: No abnormality identified within included portions of the posterior fossa. Flow voids preserved within the imaged cervical vertebral arteries. No paraspinal mass or collection.   Disc levels:   Unless otherwise stated, the level by level findings below have not significantly changed from the prior MRI of 09/30/2017.   Multilevel disc degeneration, greatest at C5-C6 (mild-to-moderate) and C6-C7 (moderate-to-advanced). C5-C6 disc degeneration has slightly progressed. No significant spinal canal stenosis.   C2-C3: Facet arthropathy (greater on the left). No significant disc herniation or stenosis.   C3-C4: Slight disc bulge. Uncovertebral hypertrophy (greater on the right and progressed on the right). Facet arthropathy, progressed and greater on the right. No significant spinal canal stenosis. Bilateral neural foraminal narrowing (moderate-to-severe right, moderate left).   C4-C5: Slight grade 1 anterolisthesis, new from the prior MRI. Slight disc bulge, also new from the prior MRI. Facet arthropathy, progressed and greater on the right. No significant spinal canal stenosis. Moderate right neural foraminal narrowing, new.  C5-C6: Slight disc bulge. Bilateral uncovertebral hypertrophy, progressed. Facet arthropathy, progressed and greater on the right. Bilateral neural foraminal narrowing (moderate right, mild left), progressed.    C6-C7: Disc bulge with endplate spurring and bilateral uncovertebral hypertrophy. No significant spinal canal stenosis. Mild bilateral neural foraminal narrowing (greater on the left).   C7-T1: Mild facet arthropathy (predominantly on the left). No significant disc herniation or stenosis.   IMPRESSION: 1. Cervical spondylosis as outlined within the body of the report and having progressed at multiple levels since the prior MRI 09/30/2017. 2. No significant spinal canal stenosis. 3. Multilevel foraminal stenosis greatest bilaterally at C3-C4 (moderate-to-severe right, moderate left), on the right at C4-C5 (moderate) and on the right at C5-C6 (moderate). 4. Disc degeneration is greatest at C5-C6 (mild-to-moderate) and C6-C7 (moderate-to-advanced). 5. Mild degenerative endplate edema at R5-R4 and C6-C7. 6. Marrow edema within the posterior elements on the left at C3, and on the right at C4-C5, likely degenerative and related to facet arthropathy. 7. Mild C4-C5 grade 1 anterolisthesis.     Note: Reviewed        Physical Exam  Vitals: BP (!) 129/93 (BP Location: Right Arm, Patient Position: Sitting, Cuff Size: Normal)   Pulse (!) 59   Temp (!) 97.2 F (36.2 C) (Temporal)   Resp 16   Ht 6' (1.829 m)   Wt 210 lb (95.3 kg)   SpO2 100%   BMI 28.48 kg/m  BMI: Estimated body mass index is 28.48 kg/m as calculated from the following:   Height as of this encounter: 6' (1.829 m).   Weight as of this encounter: 210 lb (95.3 kg). Ideal: Ideal body weight: 77.6 kg (171 lb 1.2 oz) Adjusted ideal body weight: 84.7 kg (186 lb 10.3 oz) General appearance: Well nourished, well developed, and well hydrated. In no apparent acute distress Mental status: Alert, oriented x 3 (person, place, & time)       Respiratory: No evidence of acute respiratory distress Eyes: PERLA  Cervical radicular pain  5 out of 5 strength bilateral upper extremity: Shoulder abduction, elbow flexion, elbow  extension, thumb extension.  Assessment   Diagnosis  1. Cervical radicular pain   2. Foraminal stenosis of cervical region   3. Cervico-occipital neuralgia   4. Chronic pain syndrome      Updated Problems: No problems updated.  Plan of Care  Problem-specific:  Assessment and Plan    Cervical spondylosis, cervical radicular pain with progressive symptoms   He experiences chronic neck pain, right occipital neuralgia, right shoulder pain, and dizziness due to cervical spondylosis and associated cervical radicular pain. Previous injections in July and August were ineffective and possibly worsened symptoms. MRI shows condition progression. Symptoms, including new dizziness upon head rotation, are affecting his quality of life. Surgical consultation is advised as conservative treatments have been exhausted. He is concerned about surgical hardware due to his spouse's experience and prefers minimally invasive options without hardware if possible. Refer to neurosurgery for consultation with Dr. Clois. Discuss potential surgical options, including minimally invasive procedures, with neurosurgery. Consider physical therapy as a prerequisite for surgical intervention.       Mr. ZEVEN KOCAK has a current medication list which includes the following long-term medication(s): metoprolol  succinate, paroxetine , rizatriptan , rosuvastatin , and valsartan .  Pharmacotherapy (Medications Ordered): No orders of the defined types were placed in this encounter.  Orders:  Orders Placed This Encounter  Procedures   Ambulatory referral to Neurosurgery    Referral Priority:   Routine  Referral Type:   Surgical    Referral Reason:   Specialty Services Required    Referred to Provider:   Clois Fret, MD    Requested Specialty:   Neurosurgery    Number of Visits Requested:   1     Right C7-T1 ESI 10/06/23, 11/15/23; R GONB 11/15/23    Return for patient will call to schedule F2F appt prn.     Recent Visits Date Type Provider Dept  11/15/23 Procedure visit Marcelino Nurse, MD [facility]  11/04/23 Office Visit Marcelino Nurse, MD [facility]  10/06/23 Procedure visit Marcelino Nurse, MD [facility]  09/21/23 Office Visit Marcelino Nurse, MD [facility]  Showing recent visits within past 90 days and meeting all other requirements Today's Visits Date Type Provider Dept  12/14/23 Office Visit Marcelino Nurse, MD [facility]  Showing today's visits and meeting all other requirements Future Appointments No visits were found meeting these conditions. Showing future appointments within next 90 days and meeting all other requirements  I discussed the assessment and treatment plan with the patient. The patient was provided an opportunity to ask questions and all were answered. The patient agreed with the plan and demonstrated an understanding of the instructions.  Patient advised to call back or seek an in-person evaluation if the symptoms or condition worsens.  Duration of encounter: .  Total time on encounter, as per AMA guidelines included both the face-to-face and non-face-to-face time personally spent by the physician and/or other qualified health care professional(s) on the day of the encounter (includes time in activities that require the physician or other qualified health care professional and does not include time in activities normally performed by clinical staff). Physician's time may include the following activities when performed: Preparing to see the patient (e.g., pre-charting review of records, searching for previously ordered imaging, lab work, and nerve conduction tests) Review of prior analgesic pharmacotherapies. Reviewing PMP Interpreting ordered tests (e.g., lab work, imaging, nerve conduction tests) Performing post-procedure evaluations, including interpretation of diagnostic procedures Obtaining and/or reviewing  separately obtained history Performing a medically appropriate examination and/or evaluation Counseling and educating the patient/family/caregiver Ordering medications, tests, or procedures Referring and communicating with other health care professionals (when not separately reported) Documenting clinical information in the electronic or other health record Independently interpreting results (not separately reported) and communicating results to the patient/ family/caregiver Care coordination (not separately reported)  Note by: Nurse Marcelino, MD (TTS and AI technology used. I apologize for any typographical errors that were not detected and corrected.) Date: 12/14/2023; Time: 9:38 AM

## 2023-12-14 NOTE — Progress Notes (Signed)
 Safety precautions to be maintained throughout the outpatient stay will include: orient to surroundings, keep bed in low position, maintain call bell within reach at all times, provide assistance with transfer out of bed and ambulation.

## 2023-12-26 NOTE — Progress Notes (Unsigned)
 Referring Physician:  Marcelino Nurse, MD [address] [place],  [place] [address]  Primary Physician:  Duanne Butler DASEN, MD  History of Present Illness: 12/30/2023 [name] has a history of HTN, hyperlipidemia, chronic neck pain.   Last seen by me on 10/19/23 for neck pain with some radiation to right shoulder. He has known cervical spondylosis with slip at C4-C5 and C5-C6. Has slight movement at C4-C5 on flexion. Also with  DDD C5-C7 multilevel foraminal stenosis.   He had greater occipital nerve block along with repeat right C7-T1 IL on 11/15/23 with no improvement.   He is here for follow up.   His pain has gotten much worse since above injections- he is getting more headaches (weekly) and neck pain is constant. He is having to take more migraine medications. Neck pain runs into his right shoulder, but not down his right arm. He has intermittent numbness and tingling at night and when he drives. Pain is worse when he looks up- he feels like everything is getting pinched off and he feels dizziness.   Has not been able to ride his bike due to pain. Has to limit what he can do at gym due to pain. He is still taking motrin prn.   He does not smoke.   Bowel/Bladder Dysfunction: none  Conservative measures:  Physical therapy:  has participated in the 2019 but none recently Multimodal medical therapy including regular antiinflammatories: advil, celebrex, tylenol , aspirin Injections:  Right C7-T1 IL ESI and right occipital nerve block on 11/15/23 right C7-T1 IL ESI by Dr. Marcelino on 10/06/23  Past Surgery: no spinal surgeries  Darryl Ross has no symptoms of cervical myelopathy.  The symptoms are causing a significant impact on the patient's life.   Review of Systems:  A 10 point review of systems is negative, except for the pertinent positives and negatives detailed in the HPI.  Past Medical History: Past Medical History:  Diagnosis Date   Anxiety    Arthritis     hands, neck   Degenerative disc disease, cervical    no limitations   Hypercholesteremia    Hyperlipidemia    Hypertension    neg stress test several yrs ago   Lymphocytic colitis     Past Surgical History: Past Surgical History:  Procedure Laterality Date   COLONOSCOPY WITH PROPOFOL  N/A 03/04/2015   Procedure: COLONOSCOPY WITH PROPOFOL ;  Surgeon: Rogelia Copping, MD;  Location: [facility];  Service: Endoscopy;  Laterality: N/A;   HAND SURGERY     fracture repair    Allergies: Allergies as of 12/30/2023 - Review Complete 12/30/2023  Allergen Reaction Noted   Morphine and codeine Nausea Only 10/28/2012   Statins Other (See Comments) 10/28/2012   Zetia  [ezetimibe ]  07/14/2016    Medications: Outpatient Encounter Medications as of 12/30/2023  Medication Sig   aspirin 81 MG tablet Take 81 mg by mouth daily.   Glucosamine-Chondroitin (OSTEO BI-FLEX REGULAR STRENGTH PO) Take 2 tablets by mouth daily.   meclizine  (ANTIVERT ) 25 MG tablet Take 25 mg by mouth 3 (three) times daily as needed for dizziness.   metoprolol  succinate (TOPROL -XL) 25 MG 24 hr tablet TAKE 1 TABLET BY MOUTH EVERY DAY   Multiple Vitamin (MULTIVITAMIN) tablet Take 1 tablet by mouth daily.   Omega-3 Fatty Acids (FISH OIL) 1000 MG CAPS Take 2,000 mg by mouth daily.   PARoxetine  (PAXIL ) 20 MG tablet TAKE 1 TABLET BY MOUTH EVERY DAY   rizatriptan  (MAXALT ) 10 MG tablet  TAKE 1 TABLET BY MOUTH AS NEEDED FOR MIGRAINE. MAY REPEAT IN 2 HOURS IF NEEDED   rosuvastatin  (CRESTOR ) 5 MG tablet TAKE 1 TABLET BY MOUTH EVERY DAY   valsartan  (DIOVAN ) 160 MG tablet TAKE 1 TABLET BY MOUTH EVERY DAY   ALPRAZolam  (XANAX ) 0.5 MG tablet Take 1 tablet (0.5 mg total) by mouth at bedtime as needed. (Patient not taking: Reported on 12/30/2023)   budesonide  (ENTOCORT EC ) 3 MG 24 hr capsule TAKE 3 CAPSULES (9 MG TOTAL) BY MOUTH EVERY MORNING. (Patient not taking: Reported on 12/30/2023)   No facility-administered encounter medications on  file as of 12/30/2023.    Social History: Social History   Tobacco Use   Smoking status: Never   Smokeless tobacco: Never  Vaping Use   Vaping status: Never Used  Substance Use Topics   Alcohol use: Yes    Alcohol/week: 4.0 standard drinks of alcohol    Types: 3 Cans of beer, 1 Shots of liquor per week    Comment: occasional   Drug use: No    Family Medical History: Family History  Problem Relation Age of Onset   Hyperlipidemia Mother    Arrhythmia Father        A-Fib    Hypertension Father     Physical Examination: Vitals:   12/30/23 0952  BP: 132/86   Awake, alert, oriented to person, place, and time.  Speech is clear and fluent. Fund of knowledge is appropriate.   Cranial Nerves: Pupils equal round and reactive to light.  Facial tone is symmetric.    No abnormal lesions on exposed skin.   He has no posterior cervical tenderness. He has mild right trapezial tenderness.   He has good ROM of both shoulders with no pain.    Strength: Side Biceps Triceps Deltoid Interossei Grip Wrist Ext. Wrist Flex.  R 5 5 5 5 5 5 5   L 5 5 5 5 5 5 5    Reflexes are 2+ and symmetric at the biceps, brachioradialis.  Hoffman's is absent.   Bilateral upper extremity sensation is intact to light touch.     Gait is normal.     Medical Decision Making  Imaging: None  Assessment and Plan: Darryl Ross has gotten much worse since above injections- he is getting more headaches (weekly) and neck pain is constant. Neck pain runs into his right shoulder, but not down his right arm. Pain is worse when he looks up- he feels like everything is getting pinched off and he feels dizziness.   He has known cervical spondylosis with slip at C4-C5 and C5-C6. Has slight movement at C4-C5 on flexion. Also with  DDD C5-C7 multilevel foraminal stenosis.   He's had to give up cycling and working out at the gym due to pain.   Treatment options discussed with patient and following plan made:   -  Do not recommend further injections.  - Will review with Dr. Claudene and message him with further recommendations.  - If he is surgery candidate, he would need to do PT prior to surgical discussion.   I spent a total of 20 minutes in face-to-face and non-face-to-face activities related to this patient's care today including review of outside records, review of imaging, review of symptoms, physical exam, discussion of differential diagnosis, discussion of treatment options, and documentation.   ADDENDUM 12/30/23:  Reviewed patient and imaging with Dr. Claudene. He recommends scoliosis xrays to see if he is starting to develop cervical deformity or thoracic kyphosis.  Message to patient. Will order xrays if he wants to proceed. Once I get xrays back, will likely have him follow up with Dr. Claudene to discuss further options.   Glade Boys PA-C Dept. of Neurosurgery

## 2023-12-30 ENCOUNTER — Other Ambulatory Visit: Payer: Self-pay

## 2023-12-30 ENCOUNTER — Encounter: Payer: Self-pay | Admitting: Orthopedic Surgery

## 2023-12-30 ENCOUNTER — Ambulatory Visit: Admitting: Orthopedic Surgery

## 2023-12-30 VITALS — BP 132/86 | Ht 72.0 in | Wt 210.0 lb

## 2023-12-30 DIAGNOSIS — M47812 Spondylosis without myelopathy or radiculopathy, cervical region: Secondary | ICD-10-CM

## 2023-12-30 DIAGNOSIS — M5030 Other cervical disc degeneration, unspecified cervical region: Secondary | ICD-10-CM

## 2023-12-30 DIAGNOSIS — M50323 Other cervical disc degeneration at C6-C7 level: Secondary | ICD-10-CM

## 2023-12-30 DIAGNOSIS — M50222 Other cervical disc displacement at C5-C6 level: Secondary | ICD-10-CM

## 2023-12-30 DIAGNOSIS — M50322 Other cervical disc degeneration at C5-C6 level: Secondary | ICD-10-CM

## 2023-12-30 DIAGNOSIS — M50221 Other cervical disc displacement at C4-C5 level: Secondary | ICD-10-CM

## 2023-12-30 DIAGNOSIS — M4312 Spondylolisthesis, cervical region: Secondary | ICD-10-CM

## 2023-12-30 DIAGNOSIS — M4802 Spinal stenosis, cervical region: Secondary | ICD-10-CM

## 2023-12-30 NOTE — Telephone Encounter (Signed)
 Xray order placed and patient advised

## 2023-12-31 ENCOUNTER — Ambulatory Visit
Admission: RE | Admit: 2023-12-31 | Discharge: 2023-12-31 | Disposition: A | Discharge status: Home / Self Care | Attending: Orthopedic Surgery | Admitting: Orthopedic Surgery

## 2023-12-31 ENCOUNTER — Ambulatory Visit
Admission: RE | Admit: 2023-12-31 | Discharge: 2023-12-31 | Disposition: A | Discharge status: Home / Self Care | Source: Ambulatory Visit | Attending: Orthopedic Surgery | Admitting: Orthopedic Surgery

## 2023-12-31 DIAGNOSIS — M47812 Spondylosis without myelopathy or radiculopathy, cervical region: Secondary | ICD-10-CM | POA: Diagnosis present

## 2023-12-31 DIAGNOSIS — M4312 Spondylolisthesis, cervical region: Secondary | ICD-10-CM | POA: Diagnosis present

## 2023-12-31 DIAGNOSIS — M5030 Other cervical disc degeneration, unspecified cervical region: Secondary | ICD-10-CM

## 2024-01-05 DIAGNOSIS — U071 COVID-19: Secondary | ICD-10-CM

## 2024-01-05 HISTORY — DX: COVID-19: U07.1

## 2024-01-06 ENCOUNTER — Encounter: Payer: Self-pay | Admitting: Orthopedic Surgery

## 2024-01-06 NOTE — Telephone Encounter (Signed)
Patient is scheduled for Oct 20th.

## 2024-01-06 NOTE — Telephone Encounter (Signed)
 Scoliosis xrays dated 12/31/23:  FINDINGS: 11 degree levo scoliosis of the lumbar spine from superior endplate of T12 to inferior endplate of L4. No measurable thoracic scoliosis. Sagittal image demonstrates cervical lordosis, thoracic kyphosis and lumbar lordosis.   IMPRESSION: 11 degree levo scoliosis of the lumbar spine.     Electronically Signed   By: Luke Bun M.D.   On: 01/06/2024 00:25  Will have him follow up with Dr. Claudene in clinic to discuss any possible surgery options. Message sent to patient.

## 2024-01-13 NOTE — Progress Notes (Signed)
 Referring Physician:  Marcelino Nurse, MD [address] [place],  [place] [address]  Primary Physician:  Duanne Butler DASEN, MD  Discuss surgery  History of Present Illness: 01/24/2024 [name] has a history of HTN, hyperlipidemia, chronic neck pain.  He has been following with [facility].  When she saw him last she got updated imaging given his progressive weakness numbness and tingling.  He has known cervical spondylosis at 4 5 and 5 6 and has some disease as well at C3-4.  He had a previous cervical nerve block to see whether or not he had occipital neuralgia and this did not cause any pain relief.  His pain has gotten much worse since above injections- he is getting more headaches (weekly) and neck pain is constant. He is having to take more migraine medications. Neck pain runs into his right shoulder, but not down his right arm. He has intermittent numbness and tingling at night and when he drives. Pain is worse when he looks up- he feels like everything is getting pinched off and he feels dizziness.   Has not been able to ride his bike due to pain. Has to limit what he can do at gym due to pain. He is still taking motrin prn.   He does not smoke.   Bowel/Bladder Dysfunction: none  Conservative measures:  Physical therapy:  has participated in the 2019 but none recently Multimodal medical therapy including regular antiinflammatories: advil, celebrex, tylenol , aspirin Injections:  Right C7-T1 IL ESI and right occipital nerve block on 11/15/23 right C7-T1 IL ESI by Dr. Marcelino on 10/06/23  Past Surgery: no spinal surgeries  Darryl Ross has no symptoms of cervical myelopathy.  The symptoms are causing a significant impact on the patient's life.   Review of Systems:  A 10 point review of systems is negative, except for the pertinent positives and negatives detailed in the HPI.  Past Medical History: Past Medical History:  Diagnosis Date   Anxiety    Arthritis     hands, neck   Degenerative disc disease, cervical    no limitations   Hypercholesteremia    Hyperlipidemia    Hypertension    neg stress test several yrs ago   Lymphocytic colitis     Past Surgical History: Past Surgical History:  Procedure Laterality Date   COLONOSCOPY WITH PROPOFOL  N/A 03/04/2015   Procedure: COLONOSCOPY WITH PROPOFOL ;  Surgeon: Rogelia Copping, MD;  Location: [facility];  Service: Endoscopy;  Laterality: N/A;   HAND SURGERY     fracture repair    Allergies: Allergies as of 12/30/2023 - Review Complete 12/30/2023  Allergen Reaction Noted   Morphine and codeine Nausea Only 10/28/2012   Statins Other (See Comments) 10/28/2012   Zetia  [ezetimibe ]  07/14/2016    Medications: Outpatient Encounter Medications as of 12/30/2023  Medication Sig   aspirin 81 MG tablet Take 81 mg by mouth daily.   Glucosamine-Chondroitin (OSTEO BI-FLEX REGULAR STRENGTH PO) Take 2 tablets by mouth daily.   meclizine  (ANTIVERT ) 25 MG tablet Take 25 mg by mouth 3 (three) times daily as needed for dizziness.   metoprolol  succinate (TOPROL -XL) 25 MG 24 hr tablet TAKE 1 TABLET BY MOUTH EVERY DAY   Multiple Vitamin (MULTIVITAMIN) tablet Take 1 tablet by mouth daily.   Omega-3 Fatty Acids (FISH OIL) 1000 MG CAPS Take 2,000 mg by mouth daily.   PARoxetine  (PAXIL ) 20 MG tablet TAKE 1 TABLET BY MOUTH EVERY DAY   rizatriptan  (MAXALT ) 10  MG tablet TAKE 1 TABLET BY MOUTH AS NEEDED FOR MIGRAINE. MAY REPEAT IN 2 HOURS IF NEEDED   rosuvastatin  (CRESTOR ) 5 MG tablet TAKE 1 TABLET BY MOUTH EVERY DAY   valsartan  (DIOVAN ) 160 MG tablet TAKE 1 TABLET BY MOUTH EVERY DAY   ALPRAZolam  (XANAX ) 0.5 MG tablet Take 1 tablet (0.5 mg total) by mouth at bedtime as needed. (Patient not taking: Reported on 12/30/2023)   budesonide  (ENTOCORT EC ) 3 MG 24 hr capsule TAKE 3 CAPSULES (9 MG TOTAL) BY MOUTH EVERY MORNING. (Patient not taking: Reported on 12/30/2023)   No facility-administered encounter medications on  file as of 12/30/2023.    Social History: Social History   Tobacco Use   Smoking status: Never   Smokeless tobacco: Never  Vaping Use   Vaping status: Never Used  Substance Use Topics   Alcohol use: Yes    Alcohol/week: 4.0 standard drinks of alcohol    Types: 3 Cans of beer, 1 Shots of liquor per week    Comment: occasional   Drug use: No    Family Medical History: Family History  Problem Relation Age of Onset   Hyperlipidemia Mother    Arrhythmia Father        A-Fib    Hypertension Father     Physical Examination: Vitals:   12/30/23 0952  BP: 132/86   Awake, alert, oriented to person, place, and time.  Speech is clear and fluent. Fund of knowledge is appropriate.   Cranial Nerves: Pupils equal round and reactive to light.  Facial tone is symmetric.    No abnormal lesions on exposed skin.   He has no posterior cervical tenderness. He has mild right trapezial tenderness.  He does have positive Spurling's  He has good ROM of both shoulders with no pain.    Strength: Side Biceps Triceps Deltoid Interossei Grip Wrist Ext. Wrist Flex.  R 5 5 5 5 5 5 5   L 5 5 5 5 5 5 5    Reflexes are 2+ and symmetric at the biceps, brachioradialis.  Hoffman's is absent.   Bilateral upper extremity sensation is intact to light touch, with the exception of the right trapezial region  Gait is normal.     Medical Decision Making  Imaging: Narrative & Impression  CLINICAL DATA:  Provided history: Neck pain, chronic, degenerative changes on x-ray. Cervicalgia.   EXAM: MRI CERVICAL SPINE WITHOUT CONTRAST   TECHNIQUE: Multiplanar, multisequence MR imaging of the cervical spine was performed. No intravenous contrast was administered.   COMPARISON:  Cervical spine MRI 09/30/2017.   FINDINGS: Alignment: Slight C4-C5 grade 1 anterolisthesis.   Vertebrae: Cervical vertebral body height is maintained. Mild degenerative endplate edema at R5-R4. Degenerative  endplate irregularity and mixed degenerative endplate marrow signal at C6-C7 (including mild degenerative endplate edema). Marrow edema within the posterior elements on the left at C3 and on the right at C4-C5, likely degenerative and related to facet arthropathy.   Cord: No signal abnormality identified within the cervical spinal cord.   Posterior Fossa, vertebral arteries, paraspinal tissues: No abnormality identified within included portions of the posterior fossa. Flow voids preserved within the imaged cervical vertebral arteries. No paraspinal mass or collection.   Disc levels:   Unless otherwise stated, the level by level findings below have not significantly changed from the prior MRI of 09/30/2017.   Multilevel disc degeneration, greatest at C5-C6 (mild-to-moderate) and C6-C7 (moderate-to-advanced). C5-C6 disc degeneration has slightly progressed. No significant spinal canal stenosis.   C2-C3: Facet arthropathy (  greater on the left). No significant disc herniation or stenosis.   C3-C4: Slight disc bulge. Uncovertebral hypertrophy (greater on the right and progressed on the right). Facet arthropathy, progressed and greater on the right. No significant spinal canal stenosis. Bilateral neural foraminal narrowing (moderate-to-severe right, moderate left).   C4-C5: Slight grade 1 anterolisthesis, new from the prior MRI. Slight disc bulge, also new from the prior MRI. Facet arthropathy, progressed and greater on the right. No significant spinal canal stenosis. Moderate right neural foraminal narrowing, new.   C5-C6: Slight disc bulge. Bilateral uncovertebral hypertrophy, progressed. Facet arthropathy, progressed and greater on the right. Bilateral neural foraminal narrowing (moderate right, mild left), progressed.   C6-C7: Disc bulge with endplate spurring and bilateral uncovertebral hypertrophy. No significant spinal canal stenosis. Mild bilateral neural foraminal  narrowing (greater on the left).   C7-T1: Mild facet arthropathy (predominantly on the left). No significant disc herniation or stenosis.   IMPRESSION: 1. Cervical spondylosis as outlined within the body of the report and having progressed at multiple levels since the prior MRI 09/30/2017. 2. No significant spinal canal stenosis. 3. Multilevel foraminal stenosis greatest bilaterally at C3-C4 (moderate-to-severe right, moderate left), on the right at C4-C5 (moderate) and on the right at C5-C6 (moderate). 4. Disc degeneration is greatest at C5-C6 (mild-to-moderate) and C6-C7 (moderate-to-advanced). 5. Mild degenerative endplate edema at R5-R4 and C6-C7. 6. Marrow edema within the posterior elements on the left at C3, and on the right at C4-C5, likely degenerative and related to facet arthropathy. 7. Mild C4-C5 grade 1 anterolisthesis.     Electronically Signed   By: Rockey Childs D.O.   On: 08/12/2023 14:43   Narrative & Impression  CLINICAL DATA:  Cervical spondylosis and degenerative disc disease. Pain posterior right-sided neck into right shoulder   EXAM: CERVICAL SPINE - COMPLETE 4+ VIEW   COMPARISON:  MRI cervical spine 07/28/2023   FINDINGS: Mild 1-2 mm anterolisthesis of C4-C5 and C5-C6 on neutral view. This increases to 3 mm with flexion at C4-C5. No significant change with extension. Multilevel spondylosis and disc space height loss greatest at C6-C7 where it is moderate. Multilevel facet arthropathy greatest at C4-C5. Normal prevertebral soft tissues.   IMPRESSION: 1. Mild 1-2 mm of anterolisthesis of C4-C5 and C5-C6 on neutral view. This increases to 3 mm with flexion at C4-C5. 2. Multilevel spondylosis greatest at C6-C7. Facet arthropathy is greatest at C4-C5.     Electronically Signed   By: Norman Gatlin M.D.   On: 09/01/2023 00:45      Assessment and Plan: Mr. Tindol has gotten much worse since above injections- he is getting more headaches  (weekly) and neck pain is constant. Neck pain runs into his right shoulder, but not down his right arm. Pain is worse when he looks up- he feels like everything is getting pinched off and he feels dizziness.  He feels numbness and tingling in his right trapezial region from the lateral aspect and posterior aspect of his neck down to the tip of his shoulder.  He does not have any weakness or any radiation from the C5 dermatome down.  He had to give up his cycling and had to give up working out at the gym he is otherwise very fit.  We did discuss that his MRI shows multiple levels of cervical spondylosis with some at C4-5 and C5-6 as well as some C3-4 spondylosis as well.  His most recent MRI did show worsening of his cervical spondylosis his worst of which region  is at the C3-4 level on the right, this seems to correlate most closely with his cervical radiculopathy.  Explains why he is not having any radiation down to his arms.  He does have spondylosis and some slight motion at the C4-C6 levels, but these do not appear to be overly symptomatic.  He has not yet had any physical therapy recently.  Given the lack of weakness I do think like he would benefit from an evaluation.  Should he not have any response to this out, I would plan on doing a posterior cervical decompression at the C3-4 level  Darryl MICAEL Sharps, MD Dept. of Neurosurgery

## 2024-01-19 ENCOUNTER — Ambulatory Visit: Admitting: Neurosurgery

## 2024-01-22 ENCOUNTER — Ambulatory Visit
Admission: EM | Admit: 2024-01-22 | Discharge: 2024-01-22 | Disposition: A | Discharge status: Home / Self Care | Attending: Emergency Medicine | Admitting: Emergency Medicine

## 2024-01-22 DIAGNOSIS — U071 COVID-19: Secondary | ICD-10-CM | POA: Diagnosis not present

## 2024-01-22 LAB — POC COVID19/FLU A&B COMBO
Covid Antigen, POC: POSITIVE — AB
Influenza A Antigen, POC: NEGATIVE
Influenza B Antigen, POC: NEGATIVE

## 2024-01-22 MED ORDER — PAXLOVID (300/100) 20 X 150 MG & 10 X 100MG PO TBPK: 3.0000 | ORAL_TABLET | Freq: Two times a day (BID) | ORAL | 0 refills | Status: AC

## 2024-01-22 NOTE — ED Provider Notes (Signed)
 [facility]    CSN: [id number] Arrival date & time: 01/22/24  1202      History   Chief Complaint Chief Complaint  Patient presents with   Fever   Headache   Generalized Body Aches   Nasal Congestion    HPI Darryl Ross is a 58 y.o. male.  Patient presents with fever, body aches, headache, congestion, sinus pressure since last night.  He has been treating his symptoms with Excedrin.  No cough, shortness of breath, vomiting, diarrhea.  Patient recently traveled to [place]  by bus.   Fever Associated symptoms: headaches   Headache Associated symptoms: fever     Past Medical History:  Diagnosis Date   Anxiety    Arthritis    hands, neck   Degenerative disc disease, cervical    no limitations   Hypercholesteremia    Hyperlipidemia    Hypertension    neg stress test several yrs ago   Lymphocytic colitis     Patient Active Problem List   Diagnosis Date Noted   DDD (degenerative disc disease), cervical 10/11/2017   Arthropathy of lumbar facet joint 03/04/2017   Inflammation of sacroiliac joint 03/04/2017   Special screening for malignant neoplasms, colon    Hyperlipidemia    Hypertension     Past Surgical History:  Procedure Laterality Date   COLONOSCOPY WITH PROPOFOL  N/A 03/04/2015   Procedure: COLONOSCOPY WITH PROPOFOL ;  Surgeon: Rogelia Copping, MD;  Location: [facility];  Service: Endoscopy;  Laterality: N/A;   HAND SURGERY     fracture repair       Home Medications    Prior to Admission medications   Medication Sig Start Date End Date Taking? Authorizing Provider  nirmatrelvir/ritonavir (PAXLOVID, 300/100,) 20 x 150 MG & 10 x 100MG  TBPK Take 3 tablets by mouth 2 (two) times daily for 5 days. 01/22/24 01/27/24 Yes Corlis Burnard DEL, NP  ALPRAZolam  (XANAX ) 0.5 MG tablet Take 1 tablet (0.5 mg total) by mouth at bedtime as needed. Patient not taking: Reported on 12/30/2023 07/25/21   Duanne Butler DASEN, MD  aspirin 81 MG tablet Take 81  mg by mouth daily.    [provider]  budesonide  (ENTOCORT EC ) 3 MG 24 hr capsule TAKE 3 CAPSULES (9 MG TOTAL) BY MOUTH EVERY MORNING. Patient not taking: Reported on 12/30/2023 09/02/16   Duanne Butler DASEN, MD  Glucosamine-Chondroitin (OSTEO BI-FLEX REGULAR STRENGTH PO) Take 2 tablets by mouth daily.    [provider]  meclizine  (ANTIVERT ) 25 MG tablet Take 25 mg by mouth 3 (three) times daily as needed for dizziness.    [provider]  metoprolol  succinate (TOPROL -XL) 25 MG 24 hr tablet TAKE 1 TABLET BY MOUTH EVERY DAY 12/13/23   Duanne Butler DASEN, MD  Multiple Vitamin (MULTIVITAMIN) tablet Take 1 tablet by mouth daily.    [provider]  Omega-3 Fatty Acids (FISH OIL) 1000 MG CAPS Take 2,000 mg by mouth daily.    [provider]  PARoxetine  (PAXIL ) 20 MG tablet TAKE 1 TABLET BY MOUTH EVERY DAY 12/13/23   Duanne Butler DASEN, MD  rizatriptan  (MAXALT ) 10 MG tablet TAKE 1 TABLET BY MOUTH AS NEEDED FOR MIGRAINE. MAY REPEAT IN 2 HOURS IF NEEDED 12/03/23   Duanne Butler DASEN, MD  rosuvastatin  (CRESTOR ) 5 MG tablet TAKE 1 TABLET BY MOUTH EVERY DAY 10/20/23   Duanne Butler DASEN, MD  valsartan  (DIOVAN ) 160 MG tablet TAKE 1 TABLET BY MOUTH EVERY DAY 11/05/23   Duanne Butler DASEN,  MD    Family History Family History  Problem Relation Age of Onset   Hyperlipidemia Mother    Arrhythmia Father        A-Fib    Hypertension Father     Social History Social History   Tobacco Use   Smoking status: Never   Smokeless tobacco: Never  Vaping Use   Vaping status: Never Used  Substance Use Topics   Alcohol use: Yes    Alcohol/week: 4.0 standard drinks of alcohol    Types: 3 Cans of beer, 1 Shots of liquor per week    Comment: occasional   Drug use: No     Allergies   Morphine and codeine, Statins, and Zetia  [ezetimibe ]   Review of Systems Review of Systems  Constitutional:  Positive for fever.  Neurological:  Positive for headaches.     Physical  Exam Triage Vital Signs ED Triage Vitals  Encounter Vitals Group     BP 01/22/24 1222 139/89     Girls Systolic BP Percentile --      Girls Diastolic BP Percentile --      Boys Systolic BP Percentile --      Boys Diastolic BP Percentile --      Pulse Rate 01/22/24 1222 79     Resp 01/22/24 1222 18     Temp 01/22/24 1222 99.3 F (37.4 C)     Temp src --      SpO2 01/22/24 1232 100 %     Weight --      Height --      Head Circumference --      Peak Flow --      Pain Score 01/22/24 1233 4     Pain Loc --      Pain Education --      Exclude from Growth Chart --    No data found.  Updated Vital Signs BP 139/89   Pulse 79   Temp 99.3 F (37.4 C)   Resp 18   SpO2 100%   Visual Acuity Right Eye Distance:   Left Eye Distance:   Bilateral Distance:    Right Eye Near:   Left Eye Near:    Bilateral Near:     Physical Exam   UC Treatments / Results  Labs (all labs ordered are listed, but only abnormal results are displayed) Labs Reviewed  POC COVID19/FLU A&B COMBO - Abnormal; Notable for the following components:      Result Value   Covid Antigen, POC Positive (*)    All other components within normal limits    EKG   Radiology No results found.  Procedures Procedures (including critical care time)  Medications Ordered in UC Medications - No data to display  Initial Impression / Assessment and Plan / UC Course  I have reviewed the triage vital signs and the nursing notes.  Pertinent labs & imaging results that were available during my care of the patient were reviewed by me and considered in my medical decision making (see chart for details).   COVID-19.  Patient has history of hypertension and hyperlipidemia.  Currently afebrile and vital signs are stable.  Lungs are clear and O2 sat is 100% on room air.  Rapid COVID is positive and flu negative.  Treating with Paxlovid.  GFR 68 on 10/01/2023.  Discussed that this medication is emergency authorized for  treatment of COVID.  Discussed the side effects of Paxlovid, including dysgeusia, diarrhea, myalgias, hypertension.  Also discussed the possibility  of rebound COVID.  Instructed him to pause statin while on Paxlovid and then for 5 additional days.  Instructed patient to follow up with his PCP.  ED precautions given.  Patient agrees to plan of care.    Final Clinical Impressions(s) / UC Diagnoses   Final diagnoses:  COVID-19     Discharge Instructions      Take the Paxlovid as directed.  Take Tylenol  as needed for fever or discomfort.  Rest and keep yourself hydrated.    Stop your cholesterol (rosuvastatin ) medication while taking Paxlovid and for 5 additional days after the Paxlovid is finished.   Follow up with your primary care provider.  Go to the emergency department if you have worsening symptoms.        ED Prescriptions     Medication Sig Dispense Auth. Provider   nirmatrelvir/ritonavir (PAXLOVID, 300/100,) 20 x 150 MG & 10 x 100MG  TBPK Take 3 tablets by mouth 2 (two) times daily for 5 days. 30 tablet Corlis Burnard DEL, NP      PDMP not reviewed this encounter.   Corlis Burnard DEL, NP 01/22/24 [phone]

## 2024-01-22 NOTE — ED Triage Notes (Signed)
 Patient to Urgent Care with complaints of fevers/ headaches/ nasal congestion/ sinus pain/ body aches.  Symptoms started last night. Recent travel.   Taking excedrin.

## 2024-01-22 NOTE — Discharge Instructions (Addendum)
 Take the Paxlovid as directed.  Take Tylenol  as needed for fever or discomfort.  Rest and keep yourself hydrated.    Stop your cholesterol (rosuvastatin ) medication while taking Paxlovid and for 5 additional days after the Paxlovid is finished.   Follow up with your primary care provider.  Go to the emergency department if you have worsening symptoms.

## 2024-01-24 ENCOUNTER — Ambulatory Visit: Admitting: Neurosurgery

## 2024-01-24 VITALS — BP 122/84 | Ht 72.0 in | Wt 212.4 lb

## 2024-01-24 DIAGNOSIS — M5412 Radiculopathy, cervical region: Secondary | ICD-10-CM | POA: Diagnosis not present

## 2024-01-24 DIAGNOSIS — M47812 Spondylosis without myelopathy or radiculopathy, cervical region: Secondary | ICD-10-CM | POA: Diagnosis not present

## 2024-01-24 NOTE — Patient Instructions (Signed)

## 2024-01-28 ENCOUNTER — Other Ambulatory Visit: Payer: Self-pay | Admitting: Family Medicine

## 2024-01-31 ENCOUNTER — Other Ambulatory Visit: Payer: Self-pay | Admitting: Family Medicine

## 2024-01-31 NOTE — Telephone Encounter (Unsigned)
 Copied from CRM [id number]. Topic: Clinical - Medication Refill >> Jan 31, 2024 11:27 AM Treva T wrote: Medication: ALPRAZolam  (XANAX ) 0.5 MG tablet  Has the patient contacted their pharmacy? Yes, advised to contact office  This is the patient's preferred pharmacy:  [id number] [place], [place] - [address] [place] [address] Phone: [phone] Fax: [phone]  Is this the correct pharmacy for this prescription? Yes If no, delete pharmacy and type the correct one.   Has the prescription been filled recently? Yes  Is the patient out of the medication? Yes  Has the patient been seen for an appointment in the last year OR does the patient have an upcoming appointment? Yes, last seen 10/01/23  Can we respond through MyChart? Yes  Agent: Please be advised that Rx refills may take up to 3 business days. We ask that you follow-up with your pharmacy.

## 2024-02-01 NOTE — Telephone Encounter (Signed)
 Requested medication (s) are due for refill today - expired Rx  Requested medication (s) are on the active medication list -yes  Future visit scheduled -no  Last refill: 07/25/21 #30  Notes to clinic: non delegated Rx  Requested Prescriptions  Pending Prescriptions Disp Refills   ALPRAZolam  (XANAX ) 0.5 MG tablet 30 tablet 0    Sig: Take 1 tablet (0.5 mg total) by mouth at bedtime as needed.     Not Delegated - Psychiatry: Anxiolytics/Hypnotics 2 Failed - 02/01/2024  4:16 PM      Failed - This refill cannot be delegated      Failed - Urine Drug Screen completed in last 360 days      Passed - Patient is not pregnant      Passed - Valid encounter within last 6 months    Recent Outpatient Visits           4 months ago Pure hypercholesterolemia   [facility] Pickard, Butler DASEN, MD   6 months ago DDD (degenerative disc disease), cervical   [place] [facility] Pickard, Butler DASEN, MD   10 months ago General medical exam   [place] [facility] Duanne Butler DASEN, MD   1 year ago General medical exam   [facility] Duanne Butler DASEN, MD                 Requested Prescriptions  Pending Prescriptions Disp Refills   ALPRAZolam  (XANAX ) 0.5 MG tablet 30 tablet 0    Sig: Take 1 tablet (0.5 mg total) by mouth at bedtime as needed.     Not Delegated - Psychiatry: Anxiolytics/Hypnotics 2 Failed - 02/01/2024  4:16 PM      Failed - This refill cannot be delegated      Failed - Urine Drug Screen completed in last 360 days      Passed - Patient is not pregnant      Passed - Valid encounter within last 6 months    Recent Outpatient Visits           4 months ago Pure hypercholesterolemia   [place] [facility] Pickard, Butler DASEN, MD   6 months ago DDD (degenerative disc disease), cervical   [place] [facility] Pickard, Butler DASEN, MD   10 months  ago General medical exam   [place] [facility] Duanne Butler DASEN, MD   1 year ago General medical exam    [facility] Pickard, Butler DASEN, MD

## 2024-02-03 ENCOUNTER — Encounter: Payer: Self-pay | Admitting: Family Medicine

## 2024-02-04 MED ORDER — ALPRAZOLAM 0.5 MG PO TABS: 0.5000 mg | ORAL_TABLET | Freq: Every evening | ORAL | 0 refills | Status: AC | PRN

## 2024-02-23 ENCOUNTER — Ambulatory Visit: Admitting: Neurosurgery

## 2024-02-29 NOTE — H&P (View-Only) (Signed)
 Referring Physician:  Marcelino Nurse, MD [address] [place],  [place] [address]  Primary Physician:  Duanne Butler DASEN, MD  Discussed the use of AI scribe software for clinical note transcription with the patient, who gave verbal consent to proceed.  History of Present Illness Darryl Ross is a 58 year old male with cervical spine issues who presents with neck pain and associated symptoms.He has persistent neck pain localized to the side of the neck, described as a recurrent knot that can be massaged out but returns quickly. Pain sometimes radiates into the shoulder and can lead to posterior headaches. Dizziness and headaches are triggered by driving, especially for an hour or more, and can also occur with shorter drives. Looking up or lifting objects worsens the dizziness and causes a pinching sensation in the neck. He has had physical therapy and nerve block injections. Neck pain significantly affects driving and looking up. He can manage shoulder pain but finds the dizziness and headaches most debilitating. He runs seven miles at a ten-minute pace and is concerned that his neck symptoms may limit his ability to continue running.  Has not been able to ride his bike due to pain. Has to limit what he can do at gym due to pain. He is still taking motrin prn.   He does not smoke.   Bowel/Bladder Dysfunction: none  Conservative measures:  Physical therapy:  has participated in the 2019 but none recently Multimodal medical therapy including regular antiinflammatories: advil, celebrex, tylenol , aspirin Injections:  Right C7-T1 IL ESI and right occipital nerve block on 11/15/23 right C7-T1 IL ESI by Dr. Marcelino on 10/06/23  Past Surgery: no spinal surgeries  Darryl Ross has no symptoms of cervical myelopathy.  The symptoms are causing a significant impact on the patient's life.   Review of Systems:  A 10 point review of systems is negative, except for the pertinent  positives and negatives detailed in the HPI.  Past Medical History: Past Medical History:  Diagnosis Date   Anxiety    Arthritis    hands, neck   Degenerative disc disease, cervical    no limitations   Hypercholesteremia    Hyperlipidemia    Hypertension    neg stress test several yrs ago   Lymphocytic colitis     Past Surgical History: Past Surgical History:  Procedure Laterality Date   COLONOSCOPY WITH PROPOFOL  N/A 03/04/2015   Procedure: COLONOSCOPY WITH PROPOFOL ;  Surgeon: Rogelia Copping, MD;  Location: [facility];  Service: Endoscopy;  Laterality: N/A;   HAND SURGERY     fracture repair    Allergies: Allergies as of 12/30/2023 - Review Complete 12/30/2023  Allergen Reaction Noted   Morphine and codeine Nausea Only 10/28/2012   Statins Other (See Comments) 10/28/2012   Zetia  [ezetimibe ]  07/14/2016    Medications: Outpatient Encounter Medications as of 12/30/2023  Medication Sig   aspirin 81 MG tablet Take 81 mg by mouth daily.   Glucosamine-Chondroitin (OSTEO BI-FLEX REGULAR STRENGTH PO) Take 2 tablets by mouth daily.   meclizine  (ANTIVERT ) 25 MG tablet Take 25 mg by mouth 3 (three) times daily as needed for dizziness.   metoprolol  succinate (TOPROL -XL) 25 MG 24 hr tablet TAKE 1 TABLET BY MOUTH EVERY DAY   Multiple Vitamin (MULTIVITAMIN) tablet Take 1 tablet by mouth daily.   Omega-3 Fatty Acids (FISH OIL) 1000 MG CAPS Take 2,000 mg by mouth daily.   PARoxetine  (PAXIL ) 20 MG tablet TAKE 1 TABLET BY MOUTH  EVERY DAY   rizatriptan  (MAXALT ) 10 MG tablet TAKE 1 TABLET BY MOUTH AS NEEDED FOR MIGRAINE. MAY REPEAT IN 2 HOURS IF NEEDED   rosuvastatin  (CRESTOR ) 5 MG tablet TAKE 1 TABLET BY MOUTH EVERY DAY   valsartan  (DIOVAN ) 160 MG tablet TAKE 1 TABLET BY MOUTH EVERY DAY   ALPRAZolam  (XANAX ) 0.5 MG tablet Take 1 tablet (0.5 mg total) by mouth at bedtime as needed. (Patient not taking: Reported on 12/30/2023)   budesonide  (ENTOCORT EC ) 3 MG 24 hr capsule TAKE 3 CAPSULES  (9 MG TOTAL) BY MOUTH EVERY MORNING. (Patient not taking: Reported on 12/30/2023)   No facility-administered encounter medications on file as of 12/30/2023.    Social History: Social History   Tobacco Use   Smoking status: Never   Smokeless tobacco: Never  Vaping Use   Vaping status: Never Used  Substance Use Topics   Alcohol use: Yes    Alcohol/week: 4.0 standard drinks of alcohol    Types: 3 Cans of beer, 1 Shots of liquor per week    Comment: occasional   Drug use: No    Family Medical History: Family History  Problem Relation Age of Onset   Hyperlipidemia Mother    Arrhythmia Father        A-Fib    Hypertension Father     Physical Examination: Vitals:   12/30/23 0952  BP: 132/86   Awake, alert, oriented to person, place, and time.  Speech is clear and fluent. Fund of knowledge is appropriate.   Cranial Nerves: Pupils equal round and reactive to light.  Facial tone is symmetric.    No abnormal lesions on exposed skin.   He has no posterior cervical tenderness. He has mild right trapezial tenderness.  He does have positive Spurling's  He has good ROM of both shoulders with no pain.    Strength: Side Biceps Triceps Deltoid Interossei Grip Wrist Ext. Wrist Flex.  R 5 5 5 5 5 5 5   L 5 5 5 5 5 5 5    Reflexes are 2+ and symmetric at the biceps, brachioradialis.  Hoffman's is absent.   Bilateral upper extremity sensation is intact to light touch, with the exception of the right trapezial region  Gait is normal.     Medical Decision Making  Imaging: Narrative & Impression  CLINICAL DATA:  Provided history: Neck pain, chronic, degenerative changes on x-ray. Cervicalgia.   EXAM: MRI CERVICAL SPINE WITHOUT CONTRAST   TECHNIQUE: Multiplanar, multisequence MR imaging of the cervical spine was performed. No intravenous contrast was administered.   COMPARISON:  Cervical spine MRI 09/30/2017.   FINDINGS: Alignment: Slight C4-C5 grade 1 anterolisthesis.    Vertebrae: Cervical vertebral body height is maintained. Mild degenerative endplate edema at R5-R4. Degenerative endplate irregularity and mixed degenerative endplate marrow signal at C6-C7 (including mild degenerative endplate edema). Marrow edema within the posterior elements on the left at C3 and on the right at C4-C5, likely degenerative and related to facet arthropathy.   Cord: No signal abnormality identified within the cervical spinal cord.   Posterior Fossa, vertebral arteries, paraspinal tissues: No abnormality identified within included portions of the posterior fossa. Flow voids preserved within the imaged cervical vertebral arteries. No paraspinal mass or collection.   Disc levels:   Unless otherwise stated, the level by level findings below have not significantly changed from the prior MRI of 09/30/2017.   Multilevel disc degeneration, greatest at C5-C6 (mild-to-moderate) and C6-C7 (moderate-to-advanced). C5-C6 disc degeneration has slightly progressed. No significant spinal  canal stenosis.   C2-C3: Facet arthropathy (greater on the left). No significant disc herniation or stenosis.   C3-C4: Slight disc bulge. Uncovertebral hypertrophy (greater on the right and progressed on the right). Facet arthropathy, progressed and greater on the right. No significant spinal canal stenosis. Bilateral neural foraminal narrowing (moderate-to-severe right, moderate left).   C4-C5: Slight grade 1 anterolisthesis, new from the prior MRI. Slight disc bulge, also new from the prior MRI. Facet arthropathy, progressed and greater on the right. No significant spinal canal stenosis. Moderate right neural foraminal narrowing, new.   C5-C6: Slight disc bulge. Bilateral uncovertebral hypertrophy, progressed. Facet arthropathy, progressed and greater on the right. Bilateral neural foraminal narrowing (moderate right, mild left), progressed.   C6-C7: Disc bulge with endplate spurring  and bilateral uncovertebral hypertrophy. No significant spinal canal stenosis. Mild bilateral neural foraminal narrowing (greater on the left).   C7-T1: Mild facet arthropathy (predominantly on the left). No significant disc herniation or stenosis.   IMPRESSION: 1. Cervical spondylosis as outlined within the body of the report and having progressed at multiple levels since the prior MRI 09/30/2017. 2. No significant spinal canal stenosis. 3. Multilevel foraminal stenosis greatest bilaterally at C3-C4 (moderate-to-severe right, moderate left), on the right at C4-C5 (moderate) and on the right at C5-C6 (moderate). 4. Disc degeneration is greatest at C5-C6 (mild-to-moderate) and C6-C7 (moderate-to-advanced). 5. Mild degenerative endplate edema at R5-R4 and C6-C7. 6. Marrow edema within the posterior elements on the left at C3, and on the right at C4-C5, likely degenerative and related to facet arthropathy. 7. Mild C4-C5 grade 1 anterolisthesis.     Electronically Signed   By: Rockey Childs D.O.   On: 08/12/2023 14:43   Narrative & Impression  CLINICAL DATA:  Cervical spondylosis and degenerative disc disease. Pain posterior right-sided neck into right shoulder   EXAM: CERVICAL SPINE - COMPLETE 4+ VIEW   COMPARISON:  MRI cervical spine 07/28/2023   FINDINGS: Mild 1-2 mm anterolisthesis of C4-C5 and C5-C6 on neutral view. This increases to 3 mm with flexion at C4-C5. No significant change with extension. Multilevel spondylosis and disc space height loss greatest at C6-C7 where it is moderate. Multilevel facet arthropathy greatest at C4-C5. Normal prevertebral soft tissues.   IMPRESSION: 1. Mild 1-2 mm of anterolisthesis of C4-C5 and C5-C6 on neutral view. This increases to 3 mm with flexion at C4-C5. 2. Multilevel spondylosis greatest at C6-C7. Facet arthropathy is greatest at C4-C5.     Electronically Signed   By: Norman Gatlin M.D.   On: 09/01/2023 00:45       Assessment and Plan Assessment & Plan Cervical spondylosis with radiculopathy and cervicogenic headache Chronic cervical spondylosis with radiculopathy and cervicogenic headaches, primarily affecting the C3-4 level. Symptoms include neck pain, headaches, radiating shoulder pain, exacerbated by driving and neck extension. Previous treatments including physical therapy and injections have been ineffective. MRI shows tightness at C3-4, worse on the right side, with no significant spinal cord compression. Given the failure of conservative measures, and progressive C4 radiculopathy, surgical intervention is considered. - Scheduled posterior cervical foramenotomy at C3-4 to decompress the nerve and alleviate symptoms. Risks and benefits have been discussed. Patient would like to proceed with surgery.  - Discussed potential for quicker recovery compared to fusion surgery - Informed about the possibility of needing more extensive surgery if symptoms persist post-foramenotomy, like a C3-4 ACDF, however with spondylolisthesis below could be troublesome  Cervical spine instability, C4-5 and C5-6 Cervical spine instability at C4-5 and C5-6 with  excess motion and positive balance, contributing to neck pain and forward displacement. However, current symptoms are more related to C3-4 radiculopathy rather than instability. Surgical intervention for instability is not prioritized due to the primary focus on radiculopathy symptoms. - Continue to monitor symptoms and consider future intervention if instability becomes more symptomatic.  Penne MICAEL Sharps, MD Dept. of Neurosurgery

## 2024-02-29 NOTE — Progress Notes (Signed)
 Referring Physician:  Marcelino Nurse, MD [address] [place],  [place] [address]  Primary Physician:  Duanne Butler DASEN, MD  Discussed the use of AI scribe software for clinical note transcription with the patient, who gave verbal consent to proceed.  History of Present Illness Darryl Ross is a 58 year old male with cervical spine issues who presents with neck pain and associated symptoms.He has persistent neck pain localized to the side of the neck, described as a recurrent knot that can be massaged out but returns quickly. Pain sometimes radiates into the shoulder and can lead to posterior headaches. Dizziness and headaches are triggered by driving, especially for an hour or more, and can also occur with shorter drives. Looking up or lifting objects worsens the dizziness and causes a pinching sensation in the neck. He has had physical therapy and nerve block injections. Neck pain significantly affects driving and looking up. He can manage shoulder pain but finds the dizziness and headaches most debilitating. He runs seven miles at a ten-minute pace and is concerned that his neck symptoms may limit his ability to continue running.  Has not been able to ride his bike due to pain. Has to limit what he can do at gym due to pain. He is still taking motrin prn.   He does not smoke.   Bowel/Bladder Dysfunction: none  Conservative measures:  Physical therapy:  has participated in the 2019 but none recently Multimodal medical therapy including regular antiinflammatories: advil, celebrex, tylenol , aspirin Injections:  Right C7-T1 IL ESI and right occipital nerve block on 11/15/23 right C7-T1 IL ESI by Dr. Marcelino on 10/06/23  Past Surgery: no spinal surgeries  Darryl Ross has no symptoms of cervical myelopathy.  The symptoms are causing a significant impact on the patient's life.   Review of Systems:  A 10 point review of systems is negative, except for the pertinent  positives and negatives detailed in the HPI.  Past Medical History: Past Medical History:  Diagnosis Date   Anxiety    Arthritis    hands, neck   Degenerative disc disease, cervical    no limitations   Hypercholesteremia    Hyperlipidemia    Hypertension    neg stress test several yrs ago   Lymphocytic colitis     Past Surgical History: Past Surgical History:  Procedure Laterality Date   COLONOSCOPY WITH PROPOFOL  N/A 03/04/2015   Procedure: COLONOSCOPY WITH PROPOFOL ;  Surgeon: Rogelia Copping, MD;  Location: [facility];  Service: Endoscopy;  Laterality: N/A;   HAND SURGERY     fracture repair    Allergies: Allergies as of 12/30/2023 - Review Complete 12/30/2023  Allergen Reaction Noted   Morphine and codeine Nausea Only 10/28/2012   Statins Other (See Comments) 10/28/2012   Zetia  [ezetimibe ]  07/14/2016    Medications: Outpatient Encounter Medications as of 12/30/2023  Medication Sig   aspirin 81 MG tablet Take 81 mg by mouth daily.   Glucosamine-Chondroitin (OSTEO BI-FLEX REGULAR STRENGTH PO) Take 2 tablets by mouth daily.   meclizine  (ANTIVERT ) 25 MG tablet Take 25 mg by mouth 3 (three) times daily as needed for dizziness.   metoprolol  succinate (TOPROL -XL) 25 MG 24 hr tablet TAKE 1 TABLET BY MOUTH EVERY DAY   Multiple Vitamin (MULTIVITAMIN) tablet Take 1 tablet by mouth daily.   Omega-3 Fatty Acids (FISH OIL) 1000 MG CAPS Take 2,000 mg by mouth daily.   PARoxetine  (PAXIL ) 20 MG tablet TAKE 1 TABLET BY MOUTH  EVERY DAY   rizatriptan  (MAXALT ) 10 MG tablet TAKE 1 TABLET BY MOUTH AS NEEDED FOR MIGRAINE. MAY REPEAT IN 2 HOURS IF NEEDED   rosuvastatin  (CRESTOR ) 5 MG tablet TAKE 1 TABLET BY MOUTH EVERY DAY   valsartan  (DIOVAN ) 160 MG tablet TAKE 1 TABLET BY MOUTH EVERY DAY   ALPRAZolam  (XANAX ) 0.5 MG tablet Take 1 tablet (0.5 mg total) by mouth at bedtime as needed. (Patient not taking: Reported on 12/30/2023)   budesonide  (ENTOCORT EC ) 3 MG 24 hr capsule TAKE 3 CAPSULES  (9 MG TOTAL) BY MOUTH EVERY MORNING. (Patient not taking: Reported on 12/30/2023)   No facility-administered encounter medications on file as of 12/30/2023.    Social History: Social History   Tobacco Use   Smoking status: Never   Smokeless tobacco: Never  Vaping Use   Vaping status: Never Used  Substance Use Topics   Alcohol use: Yes    Alcohol/week: 4.0 standard drinks of alcohol    Types: 3 Cans of beer, 1 Shots of liquor per week    Comment: occasional   Drug use: No    Family Medical History: Family History  Problem Relation Age of Onset   Hyperlipidemia Mother    Arrhythmia Father        A-Fib    Hypertension Father     Physical Examination: Vitals:   12/30/23 0952  BP: 132/86   Awake, alert, oriented to person, place, and time.  Speech is clear and fluent. Fund of knowledge is appropriate.   Cranial Nerves: Pupils equal round and reactive to light.  Facial tone is symmetric.    No abnormal lesions on exposed skin.   He has no posterior cervical tenderness. He has mild right trapezial tenderness.  He does have positive Spurling's  He has good ROM of both shoulders with no pain.    Strength: Side Biceps Triceps Deltoid Interossei Grip Wrist Ext. Wrist Flex.  R 5 5 5 5 5 5 5   L 5 5 5 5 5 5 5    Reflexes are 2+ and symmetric at the biceps, brachioradialis.  Hoffman's is absent.   Bilateral upper extremity sensation is intact to light touch, with the exception of the right trapezial region  Gait is normal.     Medical Decision Making  Imaging: Narrative & Impression  CLINICAL DATA:  Provided history: Neck pain, chronic, degenerative changes on x-ray. Cervicalgia.   EXAM: MRI CERVICAL SPINE WITHOUT CONTRAST   TECHNIQUE: Multiplanar, multisequence MR imaging of the cervical spine was performed. No intravenous contrast was administered.   COMPARISON:  Cervical spine MRI 09/30/2017.   FINDINGS: Alignment: Slight C4-C5 grade 1 anterolisthesis.    Vertebrae: Cervical vertebral body height is maintained. Mild degenerative endplate edema at R5-R4. Degenerative endplate irregularity and mixed degenerative endplate marrow signal at C6-C7 (including mild degenerative endplate edema). Marrow edema within the posterior elements on the left at C3 and on the right at C4-C5, likely degenerative and related to facet arthropathy.   Cord: No signal abnormality identified within the cervical spinal cord.   Posterior Fossa, vertebral arteries, paraspinal tissues: No abnormality identified within included portions of the posterior fossa. Flow voids preserved within the imaged cervical vertebral arteries. No paraspinal mass or collection.   Disc levels:   Unless otherwise stated, the level by level findings below have not significantly changed from the prior MRI of 09/30/2017.   Multilevel disc degeneration, greatest at C5-C6 (mild-to-moderate) and C6-C7 (moderate-to-advanced). C5-C6 disc degeneration has slightly progressed. No significant spinal  canal stenosis.   C2-C3: Facet arthropathy (greater on the left). No significant disc herniation or stenosis.   C3-C4: Slight disc bulge. Uncovertebral hypertrophy (greater on the right and progressed on the right). Facet arthropathy, progressed and greater on the right. No significant spinal canal stenosis. Bilateral neural foraminal narrowing (moderate-to-severe right, moderate left).   C4-C5: Slight grade 1 anterolisthesis, new from the prior MRI. Slight disc bulge, also new from the prior MRI. Facet arthropathy, progressed and greater on the right. No significant spinal canal stenosis. Moderate right neural foraminal narrowing, new.   C5-C6: Slight disc bulge. Bilateral uncovertebral hypertrophy, progressed. Facet arthropathy, progressed and greater on the right. Bilateral neural foraminal narrowing (moderate right, mild left), progressed.   C6-C7: Disc bulge with endplate spurring  and bilateral uncovertebral hypertrophy. No significant spinal canal stenosis. Mild bilateral neural foraminal narrowing (greater on the left).   C7-T1: Mild facet arthropathy (predominantly on the left). No significant disc herniation or stenosis.   IMPRESSION: 1. Cervical spondylosis as outlined within the body of the report and having progressed at multiple levels since the prior MRI 09/30/2017. 2. No significant spinal canal stenosis. 3. Multilevel foraminal stenosis greatest bilaterally at C3-C4 (moderate-to-severe right, moderate left), on the right at C4-C5 (moderate) and on the right at C5-C6 (moderate). 4. Disc degeneration is greatest at C5-C6 (mild-to-moderate) and C6-C7 (moderate-to-advanced). 5. Mild degenerative endplate edema at R5-R4 and C6-C7. 6. Marrow edema within the posterior elements on the left at C3, and on the right at C4-C5, likely degenerative and related to facet arthropathy. 7. Mild C4-C5 grade 1 anterolisthesis.     Electronically Signed   By: Rockey Childs D.O.   On: 08/12/2023 14:43   Narrative & Impression  CLINICAL DATA:  Cervical spondylosis and degenerative disc disease. Pain posterior right-sided neck into right shoulder   EXAM: CERVICAL SPINE - COMPLETE 4+ VIEW   COMPARISON:  MRI cervical spine 07/28/2023   FINDINGS: Mild 1-2 mm anterolisthesis of C4-C5 and C5-C6 on neutral view. This increases to 3 mm with flexion at C4-C5. No significant change with extension. Multilevel spondylosis and disc space height loss greatest at C6-C7 where it is moderate. Multilevel facet arthropathy greatest at C4-C5. Normal prevertebral soft tissues.   IMPRESSION: 1. Mild 1-2 mm of anterolisthesis of C4-C5 and C5-C6 on neutral view. This increases to 3 mm with flexion at C4-C5. 2. Multilevel spondylosis greatest at C6-C7. Facet arthropathy is greatest at C4-C5.     Electronically Signed   By: Norman Gatlin M.D.   On: 09/01/2023 00:45       Assessment and Plan Assessment & Plan Cervical spondylosis with radiculopathy and cervicogenic headache Chronic cervical spondylosis with radiculopathy and cervicogenic headaches, primarily affecting the C3-4 level. Symptoms include neck pain, headaches, radiating shoulder pain, exacerbated by driving and neck extension. Previous treatments including physical therapy and injections have been ineffective. MRI shows tightness at C3-4, worse on the right side, with no significant spinal cord compression. Given the failure of conservative measures, and progressive C4 radiculopathy, surgical intervention is considered. - Scheduled posterior cervical foramenotomy at C3-4 to decompress the nerve and alleviate symptoms. Risks and benefits have been discussed. Patient would like to proceed with surgery.  - Discussed potential for quicker recovery compared to fusion surgery - Informed about the possibility of needing more extensive surgery if symptoms persist post-foramenotomy, like a C3-4 ACDF, however with spondylolisthesis below could be troublesome  Cervical spine instability, C4-5 and C5-6 Cervical spine instability at C4-5 and C5-6 with  excess motion and positive balance, contributing to neck pain and forward displacement. However, current symptoms are more related to C3-4 radiculopathy rather than instability. Surgical intervention for instability is not prioritized due to the primary focus on radiculopathy symptoms. - Continue to monitor symptoms and consider future intervention if instability becomes more symptomatic.  Penne MICAEL Sharps, MD Dept. of Neurosurgery

## 2024-03-10 ENCOUNTER — Other Ambulatory Visit: Payer: Self-pay | Admitting: Family Medicine

## 2024-03-13 ENCOUNTER — Ambulatory Visit: Payer: Self-pay | Admitting: Neurosurgery

## 2024-03-13 ENCOUNTER — Encounter: Payer: Self-pay | Admitting: Neurosurgery

## 2024-03-13 ENCOUNTER — Other Ambulatory Visit: Payer: Self-pay

## 2024-03-13 ENCOUNTER — Ambulatory Visit: Admitting: Neurosurgery

## 2024-03-13 VITALS — BP 120/78 | Wt 202.6 lb

## 2024-03-13 DIAGNOSIS — M5010 Cervical disc disorder with radiculopathy, unspecified cervical region: Secondary | ICD-10-CM

## 2024-03-13 DIAGNOSIS — Z01818 Encounter for other preprocedural examination: Secondary | ICD-10-CM

## 2024-03-13 DIAGNOSIS — M5030 Other cervical disc degeneration, unspecified cervical region: Secondary | ICD-10-CM

## 2024-03-13 DIAGNOSIS — M4722 Other spondylosis with radiculopathy, cervical region: Secondary | ICD-10-CM | POA: Diagnosis not present

## 2024-03-13 DIAGNOSIS — G4486 Cervicogenic headache: Secondary | ICD-10-CM

## 2024-03-13 DIAGNOSIS — M5412 Radiculopathy, cervical region: Secondary | ICD-10-CM

## 2024-03-13 DIAGNOSIS — M532X2 Spinal instabilities, cervical region: Secondary | ICD-10-CM | POA: Diagnosis not present

## 2024-03-13 DIAGNOSIS — M47812 Spondylosis without myelopathy or radiculopathy, cervical region: Secondary | ICD-10-CM | POA: Insufficient documentation

## 2024-03-13 NOTE — Telephone Encounter (Signed)
 Requested Prescriptions  Pending Prescriptions Disp Refills   metoprolol  succinate (TOPROL -XL) 25 MG 24 hr tablet [Pharmacy Med Name: METOPROLOL  SUCC ER 25 MG TAB] 90 tablet 0    Sig: TAKE 1 TABLET BY MOUTH EVERY DAY     Cardiovascular:  Beta Blockers Passed - 03/13/2024  1:50 PM      Passed - Last BP in normal range    BP Readings from Last 1 Encounters:  03/13/24 120/78         Passed - Last Heart Rate in normal range    Pulse Readings from Last 1 Encounters:  01/22/24 79         Passed - Valid encounter within last 6 months    Recent Outpatient Visits           5 months ago Pure hypercholesterolemia   [place] [facility] Duanne, Butler DASEN, MD   7 months ago DDD (degenerative disc disease), cervical   [place] [facility] Duanne, Butler DASEN, MD   11 months ago General medical exam   [place] [facility] Duanne Butler DASEN, MD   1 year ago General medical exam   [place] [facility] Duanne, Butler DASEN, MD               PARoxetine  (PAXIL ) 20 MG tablet [Pharmacy Med Name: PAROXETINE  HCL 20 MG TABLET] 90 tablet 0    Sig: TAKE 1 TABLET BY MOUTH EVERY DAY     Psychiatry:  Antidepressants - SSRI Passed - 03/13/2024  1:50 PM      Passed - Valid encounter within last 6 months    Recent Outpatient Visits           5 months ago Pure hypercholesterolemia   [place] [facility] Duanne Butler DASEN, MD   7 months ago DDD (degenerative disc disease), cervical   [place] [facility] Duanne, Butler DASEN, MD   11 months ago General medical exam   [place] [facility] Duanne Butler DASEN, MD   1 year ago General medical exam   [place] [facility] Pickard, Butler DASEN, MD

## 2024-03-13 NOTE — Patient Instructions (Signed)
 Please see below for information in regards to your upcoming surgery:   Planned surgery: Right side C3-4 foraminotomy, MIS (minimally invasive)   Surgery date: 04/11/24 at [facility] (Medical Mall: [address] [place], [place] [address]) - you will find out your arrival time the business day before your surgery.   Pre-op appointment at [facility] Pre-admit Testing: you will receive a call with a date/time for this appointment. If you are scheduled for an in person appointment, Pre-admit Testing is located on the first floor of the Medical Arts building, [address], [address]. During this appointment, they will advise you which medications you can take the morning of surgery, and which medications you will need to hold for surgery. Labs (such as blood work, EKG) may be done at your pre-op appointment. You are not required to fast for these labs. Should you need to change your pre-op appointment, please call Pre-admit testing at [phone].     Blood thinners:   Aspirin 81mg :   if taking as a preventative, stop aspirin 7 days prior, resume aspirin 14 days after      Common restrictions after spine surgery: No bending, lifting, or twisting ("BLT"). Avoid lifting objects heavier than 10 pounds for the first 6 weeks after surgery. Where possible, avoid household activities that involve lifting, bending, reaching, pushing, or pulling such as laundry, vacuuming, grocery shopping, and childcare. Try to arrange for help from friends and family for these activities while you heal. Do not drive while taking prescription pain medication. Weeks 6 through 12 after surgery: avoid lifting more than 25 pounds.     How to contact us :  If you have any questions/concerns before or after surgery, you can reach us  at [phone], or you can send a mychart message. We can be reached by phone or mychart 8am-4pm, Monday-Friday.  *Please note: Calls after 4pm are  forwarded to a third party answering service. Mychart messages are not routinely monitored during evenings, weekends, and holidays. Please call our office to contact the answering service for urgent concerns during non-business hours.    If you have FMLA/disability paperwork, please drop it off or fax it to [phone]   Appointments/FMLA & disability paperwork: Reche Hait, & Nichole Registered Nurse/Surgery scheduler: Rosanna Bickle, RN & Katie, RN Certified Medical Assistants: Don, CMA, Elenor, CMA, Damien, CMA, & Auston, [place] Physician Assistants: Lyle Decamp, PA-C, Edsel Goods, PA-C & Glade Boys, PA-C Surgeons: Penne Sharps, MD & Reeves Daisy, MD   [facility] PREADMIT TESTING VISIT and SURGERY INFORMATION SHEET   Now that surgery has been scheduled you can anticipate several phone calls from [facility] services. A pharmacy technician will call you to verify your current list of medications taken at home.               The Pre-Service Center will call to verify your insurance information and to give you billing estimates and information.             The Preadmit Testing Office will be calling to schedule a visit to obtain information for the anesthesia team and provide instructions on preparation for surgery.  What can you expect for the Preadmit Testing Visit: Appointments may be scheduled in-person or by telephone.  If a telephone visit is scheduled, you may be asked to come into the office to have lab tests or other studies performed.   This visit will not be completed any greater than 14 days prior to  your surgery.  If your surgery has been scheduled for a future date, please do not be alarmed if we have not contacted you to schedule an appointment more than a month prior to the surgery date.    Please be prepared to provide the following information during this appointment:            -Personal medical history                                                -Medication and allergy list            -Any history of problems with anesthesia              -Recent lab work or diagnostic studies            -Please notify us  of any needs we should be aware of to provide the best care possible           -You will be provided with instructions on how to prepare for your surgery.    On The Day of Surgery:  You must have a driver to take you home after surgery, you will be asked not to drive for 24 hours following surgery.  Taxi, Gisele and non-medical transport will not be acceptable means of transportation unless you have a responsible individual who will be traveling with you.  Visitors in the surgical area:   2 people will be able to visit you in your room once your preparation for surgery has been completed. During surgery, your visitors will be asked to wait in the Surgery Waiting Area.  It is not a requirement for them to stay, if they prefer to leave and come back.  Your visitor(s) will be given an update once the surgery has been completed.  No visitors are allowed in the initial recovery room to respect patient privacy and safety.  Once you are more awake and transfer to the secondary recovery area, or are transferred to an inpatient room, visitors will again be able to see you.  To respect and protect your privacy: We will ask on the day of surgery who your driver will be and what the contact number for that individual will be. We will ask if it is okay to share information with this individual, or if there is an alternative individual that we, or the surgeon, should contact to provide updates and information. If family or friends come to the surgical information desk requesting information about you, who you have not listed with us , no information will be given.   It may be helpful to designate someone as the main contact who will be responsible for updating your other friends and family.    PREADMIT TESTING OFFICE: [phone] SAME  DAY SURGERY: [phone] We look forward to caring for you before and throughout the process of your surgery.

## 2024-03-28 ENCOUNTER — Encounter
Admission: RE | Admit: 2024-03-28 | Discharge: 2024-03-28 | Disposition: A | Discharge status: Home / Self Care | Source: Ambulatory Visit | Attending: Neurosurgery | Admitting: Neurosurgery

## 2024-03-28 ENCOUNTER — Other Ambulatory Visit: Payer: Self-pay

## 2024-03-28 VITALS — BP 143/93 | HR 50 | Resp 16 | Ht 72.0 in | Wt 201.7 lb

## 2024-03-28 DIAGNOSIS — M47812 Spondylosis without myelopathy or radiculopathy, cervical region: Secondary | ICD-10-CM | POA: Insufficient documentation

## 2024-03-28 DIAGNOSIS — Z01812 Encounter for preprocedural laboratory examination: Secondary | ICD-10-CM

## 2024-03-28 DIAGNOSIS — I10 Essential (primary) hypertension: Secondary | ICD-10-CM

## 2024-03-28 DIAGNOSIS — R001 Bradycardia, unspecified: Secondary | ICD-10-CM | POA: Diagnosis not present

## 2024-03-28 DIAGNOSIS — Z01818 Encounter for other preprocedural examination: Secondary | ICD-10-CM | POA: Insufficient documentation

## 2024-03-28 DIAGNOSIS — Z01810 Encounter for preprocedural cardiovascular examination: Secondary | ICD-10-CM

## 2024-03-28 HISTORY — DX: Spondylosis without myelopathy or radiculopathy, cervical region: M47.812

## 2024-03-28 HISTORY — DX: Cervical disc disorder with radiculopathy, unspecified cervical region: M50.10

## 2024-03-28 LAB — CBC
HCT: 45.1 % (ref 39.0–52.0)
Hemoglobin: 15 g/dL (ref 13.0–17.0)
MCH: 31.4 pg (ref 26.0–34.0)
MCHC: 33.3 g/dL (ref 30.0–36.0)
MCV: 94.5 fL (ref 80.0–100.0)
Platelets: 245 K/uL (ref 150–400)
RBC: 4.77 MIL/uL (ref 4.22–5.81)
RDW: 13.2 % (ref 11.5–15.5)
WBC: 4.8 K/uL (ref 4.0–10.5)
nRBC: 0 % (ref 0.0–0.2)

## 2024-03-28 LAB — BASIC METABOLIC PANEL WITH GFR
Anion gap: 11 (ref 5–15)
BUN: 15 mg/dL (ref 6–20)
CO2: 25 mmol/L (ref 22–32)
Calcium: 9.4 mg/dL (ref 8.9–10.3)
Chloride: 102 mmol/L (ref 98–111)
Creatinine, Ser: 1.01 mg/dL (ref 0.61–1.24)
GFR, Estimated: >60 mL/min
Glucose, Bld: 102 mg/dL — ABNORMAL HIGH (ref 70–99)
Potassium: 4.6 mmol/L (ref 3.5–5.1)
Sodium: 139 mmol/L (ref 135–145)

## 2024-03-28 LAB — TYPE AND SCREEN
ABO/RH(D): O POS
Antibody Screen: NEGATIVE

## 2024-03-28 LAB — SURGICAL PCR SCREEN
MRSA, PCR: NEGATIVE
Staphylococcus aureus: NEGATIVE

## 2024-03-28 NOTE — Patient Instructions (Signed)
 Your procedure is scheduled on:04-12-23 Tuesday Report to the Registration Desk on the 1st floor of the Medical Mall.Then proceed to the 2nd floor Surgery Desk To find out your arrival time, please call [phone] between 1PM - 3PM on:04-11-23 Monday If your arrival time is 6:00 am, do not arrive before that time as the Medical Mall entrance doors do not open until 6:00 am.  REMEMBER: Instructions that are not followed completely may result in serious medical risk, up to and including death; or upon the discretion of your surgeon and anesthesiologist your surgery may need to be rescheduled.  Do not eat food after midnight the night before surgery.  No gum chewing or hard candies.  You may however, drink CLEAR liquids up to 2 hours before you are scheduled to arrive for your surgery. Do not drink anything within 2 hours of your scheduled arrival time.  Clear liquids include: - water   - apple juice without pulp - gatorade (not RED colors) - black coffee or tea (Do NOT add milk or creamers to the coffee or tea) Do NOT drink anything that is not on this list.  One week prior to surgery:Last dose will be on 04-03-24 (Monday) Stop ANY OVER THE COUNTER supplements until after surgery (Glucosamine-Chondroitin, Multivitamin, Fish Oil)  Stop your 81 mg Aspirin 7 days prior to surgery-Last dose will be on 04-03-24 (Monday)  Continue taking all of your other prescription medications up until the day of surgery.  ON THE DAY OF SURGERY ONLY TAKE THESE MEDICATIONS WITH SIPS OF WATER : -metoprolol  succinate (TOPROL -XL)  -PARoxetine  (PAXIL )   No Alcohol for 24 hours before or after surgery.  No Smoking including e-cigarettes for 24 hours before surgery.  No chewable tobacco products for at least 6 hours before surgery.  No nicotine patches on the day of surgery.  Do not use any recreational drugs for at least a week (preferably 2 weeks) before your surgery.  Please be advised that the  combination of cocaine and anesthesia may have negative outcomes, up to and including death. If you test positive for cocaine, your surgery will be cancelled.  On the morning of surgery brush your teeth with toothpaste and water , you may rinse your mouth with mouthwash if you wish. Do not swallow any toothpaste or mouthwash.  Use CHG Soap as directed on instruction sheet.  Do not wear jewelry, make-up, hairpins, clips or nail polish.  For welded (permanent) jewelry: bracelets, anklets, waist bands, etc.  Please have this removed prior to surgery.  If it is not removed, there is a chance that hospital personnel will need to cut it off on the day of surgery.  Do not wear lotions, powders, or perfumes.   Do not shave body hair from the neck down 48 hours before surgery.  Contact lenses, hearing aids and dentures may not be worn into surgery.  Do not bring valuables to the hospital. [facility] is not responsible for any missing/lost belongings or valuables.   Notify your doctor if there is any change in your medical condition (cold, fever, infection).  Wear comfortable clothing (specific to your surgery type) to the hospital.  After surgery, you can help prevent lung complications by doing breathing exercises.  Take deep breaths and cough every 1-2 hours. Your doctor may order a device called an Incentive Spirometer to help you take deep breaths. When coughing or sneezing, hold a pillow firmly against your incision with both hands. This is called splinting. Doing this helps protect  your incision. It also decreases belly discomfort.  If you are being admitted to the hospital overnight, leave your suitcase in the car. After surgery it may be brought to your room.  In case of increased patient census, it may be necessary for you, the patient, to continue your postoperative care in the Same Day Surgery department.  If you are being discharged the day of surgery, you will not be allowed to  drive home. You will need a responsible individual to drive you home and stay with you for 24 hours after surgery.   If you are taking public transportation, you will need to have a responsible individual with you.  Please call the Pre-admissions Testing Dept. at [phone] if you have any questions about these instructions.  Surgery Visitation Policy:  Patients having surgery or a procedure may have two visitors.  Children under the age of 67 must have an adult with them who is not the patient.    Pre-operative 4 CHG Bath Instructions   You can play a key role in reducing the risk of infection after surgery. Your skin needs to be as free of germs as possible. You can reduce the number of germs on your skin by washing with CHG (chlorhexidine gluconate) soap before surgery. CHG is an antiseptic soap that kills germs and continues to kill germs even after washing.   DO NOT use if you have an allergy to chlorhexidine/CHG or antibacterial soaps. If your skin becomes reddened or irritated, stop using the CHG and notify one of our RNs at [phone].   Please shower with the CHG soap starting 4 days before surgery using the following schedule:     Please keep in mind the following:  DO NOT shave, including legs and underarms, starting the day of your first shower.   You may shave your face at any point before/day of surgery.  Place clean sheets on your bed the day you start using CHG soap. Use a clean washcloth (not used since being washed) for each shower. DO NOT sleep with pets once you start using the CHG.   CHG Shower Instructions:  If you choose to wash your hair and private area, wash first with your normal shampoo/soap.  After you use shampoo/soap, rinse your hair and body thoroughly to remove shampoo/soap residue.  Turn the water  OFF and apply about 3 tablespoons (45 ml) of CHG soap to a CLEAN washcloth.  Apply CHG soap ONLY FROM YOUR NECK DOWN TO YOUR TOES (washing for 3-5  minutes)  DO NOT use CHG soap on face, private areas, open wounds, or sores.  Pay special attention to the area where your surgery is being performed.  If you are having back surgery, having someone wash your back for you may be helpful. Wait 2 minutes after CHG soap is applied, then you may rinse off the CHG soap.  Pat dry with a clean towel  Put on clean clothes/pajamas   If you choose to wear lotion, please use ONLY the CHG-compatible lotions on the back of this paper.     Additional instructions for the day of surgery: DO NOT APPLY any lotions, deodorants, cologne, or perfumes.   Put on clean/comfortable clothes.  Brush your teeth.  Ask your nurse before applying any prescription medications to the skin.      CHG Compatible Lotions   Aveeno Moisturizing lotion  Cetaphil Moisturizing Cream  Cetaphil Moisturizing Lotion  Clairol Herbal Essence Moisturizing Lotion, Dry Skin  Clairol Herbal  Essence Moisturizing Lotion, Extra Dry Skin  Clairol Herbal Essence Moisturizing Lotion, Normal Skin  Curel Age Defying Therapeutic Moisturizing Lotion with Alpha Hydroxy  Curel Extreme Care Body Lotion  Curel Soothing Hands Moisturizing Hand Lotion  Curel Therapeutic Moisturizing Cream, Fragrance-Free  Curel Therapeutic Moisturizing Lotion, Fragrance-Free  Curel Therapeutic Moisturizing Lotion, Original Formula  Eucerin Daily Replenishing Lotion  Eucerin Dry Skin Therapy Plus Alpha Hydroxy Crme  Eucerin Dry Skin Therapy Plus Alpha Hydroxy Lotion  Eucerin Original Crme  Eucerin Original Lotion  Eucerin Plus Crme Eucerin Plus Lotion  Eucerin TriLipid Replenishing Lotion  Keri Anti-Bacterial Hand Lotion  Keri Deep Conditioning Original Lotion Dry Skin Formula Softly Scented  Keri Deep Conditioning Original Lotion, Fragrance Free Sensitive Skin Formula  Keri Lotion Fast Absorbing Fragrance Free Sensitive Skin Formula  Keri Lotion Fast Absorbing Softly Scented Dry Skin Formula  Keri  Original Lotion  Keri Skin Renewal Lotion Keri Silky Smooth Lotion  Keri Silky Smooth Sensitive Skin Lotion  Nivea Body Creamy Conditioning Oil  Nivea Body Extra Enriched Teacher, Adult Education Moisturizing Lotion Nivea Crme  Nivea Skin Firming Lotion  NutraDerm 30 Skin Lotion  NutraDerm Skin Lotion  NutraDerm Therapeutic Skin Cream  NutraDerm Therapeutic Skin Lotion  ProShield Protective Hand Cream  Provon moisturizing lotion   Merchandiser, Retail to address health-related social needs:  [email or web address]

## 2024-04-11 ENCOUNTER — Other Ambulatory Visit: Payer: Self-pay

## 2024-04-11 ENCOUNTER — Ambulatory Visit
Admission: RE | Admit: 2024-04-11 | Discharge: 2024-04-11 | Disposition: A | Discharge status: Home / Self Care | Attending: Neurosurgery | Admitting: Neurosurgery

## 2024-04-11 ENCOUNTER — Encounter
Admission: RE | Disposition: A | Discharge status: Home / Self Care | Payer: Self-pay | Source: Home / Self Care | Attending: Neurosurgery

## 2024-04-11 ENCOUNTER — Encounter: Payer: Self-pay | Admitting: Neurosurgery

## 2024-04-11 ENCOUNTER — Ambulatory Visit

## 2024-04-11 ENCOUNTER — Ambulatory Visit: Admitting: Anesthesiology

## 2024-04-11 DIAGNOSIS — K219 Gastro-esophageal reflux disease without esophagitis: Secondary | ICD-10-CM | POA: Diagnosis not present

## 2024-04-11 DIAGNOSIS — G4486 Cervicogenic headache: Secondary | ICD-10-CM | POA: Insufficient documentation

## 2024-04-11 DIAGNOSIS — M4802 Spinal stenosis, cervical region: Secondary | ICD-10-CM | POA: Diagnosis not present

## 2024-04-11 DIAGNOSIS — M5010 Cervical disc disorder with radiculopathy, unspecified cervical region: Secondary | ICD-10-CM | POA: Diagnosis present

## 2024-04-11 DIAGNOSIS — M532X2 Spinal instabilities, cervical region: Secondary | ICD-10-CM | POA: Diagnosis not present

## 2024-04-11 DIAGNOSIS — Z79899 Other long term (current) drug therapy: Secondary | ICD-10-CM | POA: Diagnosis not present

## 2024-04-11 DIAGNOSIS — M5412 Radiculopathy, cervical region: Secondary | ICD-10-CM

## 2024-04-11 DIAGNOSIS — I10 Essential (primary) hypertension: Secondary | ICD-10-CM | POA: Diagnosis not present

## 2024-04-11 DIAGNOSIS — M5011 Cervical disc disorder with radiculopathy,  high cervical region: Secondary | ICD-10-CM

## 2024-04-11 DIAGNOSIS — Z7982 Long term (current) use of aspirin: Secondary | ICD-10-CM | POA: Insufficient documentation

## 2024-04-11 DIAGNOSIS — M5030 Other cervical disc degeneration, unspecified cervical region: Secondary | ICD-10-CM | POA: Diagnosis present

## 2024-04-11 DIAGNOSIS — E7800 Pure hypercholesterolemia, unspecified: Secondary | ICD-10-CM | POA: Diagnosis not present

## 2024-04-11 DIAGNOSIS — F419 Anxiety disorder, unspecified: Secondary | ICD-10-CM | POA: Diagnosis not present

## 2024-04-11 DIAGNOSIS — I4581 Long QT syndrome: Secondary | ICD-10-CM | POA: Diagnosis not present

## 2024-04-11 DIAGNOSIS — Z7952 Long term (current) use of systemic steroids: Secondary | ICD-10-CM | POA: Diagnosis not present

## 2024-04-11 DIAGNOSIS — M4312 Spondylolisthesis, cervical region: Secondary | ICD-10-CM | POA: Insufficient documentation

## 2024-04-11 DIAGNOSIS — Z01818 Encounter for other preprocedural examination: Secondary | ICD-10-CM

## 2024-04-11 DIAGNOSIS — M4722 Other spondylosis with radiculopathy, cervical region: Secondary | ICD-10-CM | POA: Insufficient documentation

## 2024-04-11 HISTORY — PX: FORAMINOTOMY, SPINE, CERVICAL, 1 LEVEL: SHX6970

## 2024-04-11 LAB — ABO/RH: ABO/RH(D): O POS

## 2024-04-11 SURGERY — FORAMINOTOMY, SPINE, CERVICAL, 1 LEVEL
Anesthesia: General | Site: Spine Cervical | Laterality: Right

## 2024-04-11 MED ORDER — FENTANYL CITRATE (PF) 100 MCG/2ML IJ SOLN
INTRAMUSCULAR | Status: AC
  Filled 2024-04-11: qty 2

## 2024-04-11 MED ORDER — MIDAZOLAM HCL (PF) 2 MG/2ML IJ SOLN
INTRAMUSCULAR | Status: DC | PRN
  Administered 2024-04-11: 2 mg via INTRAVENOUS

## 2024-04-11 MED ORDER — ROCURONIUM BROMIDE 10 MG/ML (PF) SYRINGE
PREFILLED_SYRINGE | INTRAVENOUS | Status: AC
  Filled 2024-04-11: qty 10

## 2024-04-11 MED ORDER — ORAL CARE MOUTH RINSE: 15.0000 mL | Freq: Once | OROMUCOSAL | Status: DC

## 2024-04-11 MED ORDER — SENNA 8.6 MG PO TABS
1.0000 | ORAL_TABLET | Freq: Two times a day (BID) | ORAL | 0 refills | Status: AC | PRN
  Filled 2024-04-11: qty 15, 8d supply, fill #0

## 2024-04-11 MED ORDER — ONDANSETRON HCL 4 MG/2ML IJ SOLN
INTRAMUSCULAR | Status: DC | PRN
  Administered 2024-04-11: 4 mg via INTRAVENOUS

## 2024-04-11 MED ORDER — BUPIVACAINE-EPINEPHRINE (PF) 0.5% -1:200000 IJ SOLN
INTRAMUSCULAR | Status: AC
  Filled 2024-04-11: qty 10

## 2024-04-11 MED ORDER — PROPOFOL 10 MG/ML IV BOLUS
INTRAVENOUS | Status: DC | PRN
  Administered 2024-04-11: 200 mg via INTRAVENOUS

## 2024-04-11 MED ORDER — ATROPINE SULFATE 1 MG/ML IV SOLN
INTRAVENOUS | Status: DC | PRN
  Administered 2024-04-11: .3 mg via INTRAVENOUS

## 2024-04-11 MED ORDER — LIDOCAINE HCL (PF) 2 % IJ SOLN
INTRAMUSCULAR | Status: AC
  Filled 2024-04-11: qty 5

## 2024-04-11 MED ORDER — MIDAZOLAM HCL 2 MG/2ML IJ SOLN
INTRAMUSCULAR | Status: AC
  Filled 2024-04-11: qty 2

## 2024-04-11 MED ORDER — PROPOFOL 10 MG/ML IV BOLUS
INTRAVENOUS | Status: AC
  Filled 2024-04-11: qty 20

## 2024-04-11 MED ORDER — SUGAMMADEX SODIUM 200 MG/2ML IV SOLN
INTRAVENOUS | Status: DC | PRN
  Administered 2024-04-11: 200 mg via INTRAVENOUS

## 2024-04-11 MED ORDER — PHENYLEPHRINE 80 MCG/ML (10ML) SYRINGE FOR IV PUSH (FOR BLOOD PRESSURE SUPPORT)
PREFILLED_SYRINGE | INTRAVENOUS | Status: DC | PRN
  Administered 2024-04-11 (×4): 80 ug via INTRAVENOUS

## 2024-04-11 MED ORDER — 0.9 % SODIUM CHLORIDE (POUR BTL) OPTIME
TOPICAL | Status: DC | PRN
  Administered 2024-04-11: 500 mL

## 2024-04-11 MED ORDER — CEFAZOLIN SODIUM-DEXTROSE 2-4 GM/100ML-% IV SOLN
2.0000 g | Freq: Once | INTRAVENOUS | Status: AC
  Administered 2024-04-11: 2 g via INTRAVENOUS

## 2024-04-11 MED ORDER — ONDANSETRON HCL 4 MG/2ML IJ SOLN
INTRAMUSCULAR | Status: AC
  Filled 2024-04-11: qty 2

## 2024-04-11 MED ORDER — BUPIVACAINE HCL (PF) 0.5 % IJ SOLN
INTRAMUSCULAR | Status: DC | PRN
  Administered 2024-04-11: 30 mL

## 2024-04-11 MED ORDER — BUPIVACAINE HCL (PF) 0.5 % IJ SOLN
INTRAMUSCULAR | Status: AC
  Filled 2024-04-11: qty 30

## 2024-04-11 MED ORDER — CEFAZOLIN SODIUM-DEXTROSE 2-4 GM/100ML-% IV SOLN
INTRAVENOUS | Status: AC
  Filled 2024-04-11: qty 100

## 2024-04-11 MED ORDER — SODIUM CHLORIDE 0.9 % IV SOLN: INTRAVENOUS | Status: DC | PRN

## 2024-04-11 MED ORDER — LIDOCAINE HCL (CARDIAC) PF 100 MG/5ML IV SOSY
PREFILLED_SYRINGE | INTRAVENOUS | Status: DC | PRN
  Administered 2024-04-11 (×2): 100 mg via INTRAVENOUS

## 2024-04-11 MED ORDER — DEXAMETHASONE SOD PHOSPHATE PF 10 MG/ML IJ SOLN
INTRAMUSCULAR | Status: DC | PRN
  Administered 2024-04-11: 10 mg via INTRAVENOUS

## 2024-04-11 MED ORDER — PHENYLEPHRINE HCL-NACL 20-0.9 MG/250ML-% IV SOLN
INTRAVENOUS | Status: DC | PRN
  Administered 2024-04-11: 30 ug/min via INTRAVENOUS

## 2024-04-11 MED ORDER — OXYCODONE HCL 5 MG PO TABS
5.0000 mg | ORAL_TABLET | ORAL | 0 refills | Status: AC | PRN
  Filled 2024-04-11: qty 30, 5d supply, fill #0

## 2024-04-11 MED ORDER — PROPOFOL 1000 MG/100ML IV EMUL
INTRAVENOUS | Status: AC
  Filled 2024-04-11: qty 100

## 2024-04-11 MED ORDER — GLYCOPYRROLATE 0.2 MG/ML IJ SOLN
INTRAMUSCULAR | Status: DC | PRN
  Administered 2024-04-11: .2 mg via INTRAVENOUS

## 2024-04-11 MED ORDER — CHLORHEXIDINE GLUCONATE 0.12 % MT SOLN
OROMUCOSAL | Status: AC
  Filled 2024-04-11: qty 15

## 2024-04-11 MED ORDER — KETAMINE HCL 50 MG/5ML IJ SOSY
PREFILLED_SYRINGE | INTRAMUSCULAR | Status: DC | PRN
  Administered 2024-04-11: 30 mg via INTRAVENOUS

## 2024-04-11 MED ORDER — METHYLPREDNISOLONE ACETATE 40 MG/ML IJ SUSP
INTRAMUSCULAR | Status: AC
  Filled 2024-04-11: qty 1

## 2024-04-11 MED ORDER — DROPERIDOL 2.5 MG/ML IJ SOLN: 0.6250 mg | Freq: Once | INTRAMUSCULAR | Status: DC | PRN

## 2024-04-11 MED ORDER — LACTATED RINGERS IV SOLN: INTRAVENOUS | Status: DC

## 2024-04-11 MED ORDER — ACETAMINOPHEN 10 MG/ML IV SOLN
INTRAVENOUS | Status: DC | PRN
  Administered 2024-04-11: 1000 mg via INTRAVENOUS

## 2024-04-11 MED ORDER — PROPOFOL 500 MG/50ML IV EMUL
INTRAVENOUS | Status: DC | PRN
  Administered 2024-04-11: 25 ug/kg/min via INTRAVENOUS

## 2024-04-11 MED ORDER — FENTANYL CITRATE (PF) 100 MCG/2ML IJ SOLN: 25.0000 ug | INTRAMUSCULAR | Status: DC | PRN

## 2024-04-11 MED ORDER — PHENYLEPHRINE HCL-NACL 20-0.9 MG/250ML-% IV SOLN
INTRAVENOUS | Status: AC
  Filled 2024-04-11: qty 250

## 2024-04-11 MED ORDER — FENTANYL CITRATE (PF) 100 MCG/2ML IJ SOLN
INTRAMUSCULAR | Status: DC | PRN
  Administered 2024-04-11 (×2): 50 ug via INTRAVENOUS

## 2024-04-11 MED ORDER — METHOCARBAMOL 500 MG PO TABS
500.0000 mg | ORAL_TABLET | Freq: Four times a day (QID) | ORAL | 0 refills | Status: AC | PRN
  Filled 2024-04-11: qty 120, 30d supply, fill #0

## 2024-04-11 MED ORDER — CHLORHEXIDINE GLUCONATE 0.12 % MT SOLN: 15.0000 mL | Freq: Once | OROMUCOSAL | Status: DC

## 2024-04-11 MED ORDER — EPHEDRINE SULFATE (PRESSORS) 25 MG/5ML IV SOSY
PREFILLED_SYRINGE | INTRAVENOUS | Status: DC | PRN
  Administered 2024-04-11: 5 mg via INTRAVENOUS
  Administered 2024-04-11 (×3): 10 mg via INTRAVENOUS

## 2024-04-11 MED ORDER — SURGIFLO WITH THROMBIN (HEMOSTATIC MATRIX KIT) OPTIME
TOPICAL | Status: DC | PRN
  Administered 2024-04-11: 1 via TOPICAL

## 2024-04-11 MED ORDER — METHYLPREDNISOLONE ACETATE 40 MG/ML IJ SUSP
INTRAMUSCULAR | Status: DC | PRN
  Administered 2024-04-11: 40 mg

## 2024-04-11 MED ORDER — ROCURONIUM BROMIDE 100 MG/10ML IV SOLN
INTRAVENOUS | Status: DC | PRN
  Administered 2024-04-11: 50 mg via INTRAVENOUS
  Administered 2024-04-11: 20 mg via INTRAVENOUS

## 2024-04-11 MED ORDER — BUPIVACAINE-EPINEPHRINE (PF) 0.5% -1:200000 IJ SOLN
INTRAMUSCULAR | Status: DC | PRN
  Administered 2024-04-11: 7 mL

## 2024-04-11 MED ORDER — KETAMINE HCL 50 MG/5ML IJ SOSY
PREFILLED_SYRINGE | INTRAMUSCULAR | Status: AC
  Filled 2024-04-11: qty 5

## 2024-04-11 SURGICAL SUPPLY — 32 items
BASIN KIT SINGLE STR (MISCELLANEOUS) ×1 IMPLANT
BRUSH SCRUB EZ 4% CHG (MISCELLANEOUS) ×1 IMPLANT
BUR NEURO DRILL SOFT 3.0X3.8M (BURR) ×1 IMPLANT
DERMABOND ADVANCED .7 DNX12 (GAUZE/BANDAGES/DRESSINGS) ×1 IMPLANT
DRAPE C-ARM XRAY 36X54 (DRAPES) ×2 IMPLANT
DRAPE LAPAROTOMY 100X77 ABD (DRAPES) ×1 IMPLANT
DRAPE SPINE LEICA/WILD 54X150 (DRAPES) ×1 IMPLANT
DRSG OPSITE POSTOP 3X4 (GAUZE/BANDAGES/DRESSINGS) ×1 IMPLANT
ELECTRODE EZSTD 165MM 6.5IN (MISCELLANEOUS) ×1 IMPLANT
ELECTRODE REM PT RTRN 9FT ADLT (ELECTROSURGICAL) ×1 IMPLANT
GLOVE BIOGEL PI IND STRL 6.5 (GLOVE) IMPLANT
GLOVE BIOGEL PI IND STRL 7.0 (GLOVE) ×1 IMPLANT
GLOVE BIOGEL PI IND STRL 8 (GLOVE) ×2 IMPLANT
GLOVE SURG SYN 6.5 PF PI (GLOVE) IMPLANT
GLOVE SURG SYN 7.0 PF PI (GLOVE) ×1 IMPLANT
GLOVE SURG SYN 7.5 PF PI (GLOVE) ×1 IMPLANT
GOWN SRG XL LVL 3 NONREINFORCE (GOWNS) ×1 IMPLANT
GOWN STRL REUS W/ TWL LRG LVL3 (GOWN DISPOSABLE) ×1 IMPLANT
KIT TURNOVER KIT A (KITS) ×1 IMPLANT
KIT WILSON FRAME (KITS) ×1 IMPLANT
NDL SAFETY ECLIP 18X1.5 (MISCELLANEOUS) ×1 IMPLANT
NS IRRIG 500ML POUR BTL (IV SOLUTION) ×1 IMPLANT
PACK LAMINECTOMY ARMC (PACKS) ×1 IMPLANT
PAD ARMBOARD POSITIONER FOAM (MISCELLANEOUS) ×2 IMPLANT
SURGIFLO W/THROMBIN 8M KIT (HEMOSTASIS) ×1 IMPLANT
SUT MNCRL AB 4-0 PS2 18 (SUTURE) IMPLANT
SUT STRATA 3-0 15 PS-2 (SUTURE) IMPLANT
SUT VIC AB 0 CT1 27XCR 8 STRN (SUTURE) ×1 IMPLANT
SUT VIC AB 2-0 CT1 18 (SUTURE) ×1 IMPLANT
SYR 30ML LL (SYRINGE) ×2 IMPLANT
SYR 3ML LL SCALE MARK (SYRINGE) ×1 IMPLANT
TRAP FLUID SMOKE EVACUATOR (MISCELLANEOUS) ×1 IMPLANT

## 2024-04-11 NOTE — Interval H&P Note (Signed)
 History and Physical Interval Note:  04/11/2024 9:35 AM  Darryl Ross  has presented today for surgery, with the diagnosis of Right C4 Radiculopathy.  The various methods of treatment have been discussed with the patient and family. After consideration of risks, benefits and other options for treatment, the patient has consented to  Procedures: Right Side C3-4 Foraminotomy, MIS (Right) as a surgical intervention.  The patient's history has been reviewed, patient examined, no change in status, stable for surgery.  I have reviewed the patient's chart and labs.  Questions were answered to the patient's satisfaction.    Heart and lungs clear   [name]

## 2024-04-11 NOTE — Op Note (Signed)
 Indications: 59 year old man with a history of a progressive C4 radiculopathy refractory to all conservative management plan to go forward for a C3-4 cervical foraminotomies  Findings: Severe stenosis secondary to degenerative joint disease  Preoperative Diagnosis: Right C4 Radiculopathy Postoperative Diagnosis: Right C4 Radiculopathy   EBL: 10 mL IVF: see anesthesia record Drains: none Disposition: Extubated and Stable to PACU Complications: none  No foley catheter was placed.  Procedure: Right C3/4 Laminectomy, medial facetectomy and foraminotomy   Preoperative Note:   Risks of surgery discussed include: infection, bleeding, stroke, coma, death, paralysis, CSF leak, nerve/spinal cord injury, numbness, tingling, weakness, complex regional pain syndrome, recurrent stenosis and/or disc herniation, vascular injury, development of instability, neck/back pain, need for further surgery, persistent symptoms, development of deformity, and the risks of anesthesia. They understood these risks and have agreed to proceed.  Operative Note:    The patient was then brought from the preoperative center with intravenous access established.  The patient underwent general anesthesia and endotracheal tube intubation, then was rotated on the [facility] table where all pressure points were appropriately padded.  An incision was marked with flouroscopy. The skin was then thoroughly cleansed.  Perioperative antibiotic prophylaxis was administered.  Sterile prep and drapes were then applied and a timeout was then observed.    The patient was brought to the operating room and placed under general endotracheal anesthesia. Preoperative antibiotics were administered. The patient was positioned prone on a radiolucent table with the head secured in a levo head holder in neutral alignment. All pressure points were padded appropriately. Intraoperative fluoroscopy was used to localize the planned operative level. A small  paramedian skin incision measuring approximately 2.5 cm was made over the C3-4 level. Sequential dilators were advanced through the paraspinal musculature in a muscle-splitting fashion until the appropriate working corridor was established. A tubular retractor measuring 60mm was docked over the facet complex and secured in position. The operating microscope was brought into the field.  Using a high-speed drill and Kerrison rongeurs, a laminotomy and medial facetectomy were performed to expose the lateral recess and neural foramen. Hypertrophic bone and ligamentum flavum were removed in a stepwise manner. Care was taken to protect the dura, exiting and traversing nerve root at all times. The foramen was enlarged until the exiting C4 nerve root was fully decompressed and freely mobile. No cerebrospinal fluid leak was encountered.  Hemostasis was obtained with bipolar cautery and hemostatic agents. The tubular retractor was removed under direct visualization, ensuring no active bleeding remained in the surgical corridor. The wound was irrigated with sterile saline.  The fascia was approximated with interrupted absorbable sutures, the subcutaneous layer was closed, and the skin was closed using Dermabond. A sterile dressing was applied. The patient was extubated in the operating room and transferred to recovery in stable condition.  Edsel Goods, [facility] was the assistant surgeon, they were needed for tissue retraction, suction, and protection of neural elements.   Penne MICAEL Sharps, MD

## 2024-04-11 NOTE — Anesthesia Preprocedure Evaluation (Signed)
"                                    Anesthesia Evaluation  Patient identified by MRN, date of birth, ID band Patient awake    Reviewed: Allergy & Precautions, H&P , NPO status , Patient's Chart, lab work & pertinent test results, reviewed documented beta blocker date and time   History of Anesthesia Complications Negative for: history of anesthetic complications  Airway Mallampati: I  TM Distance: >3 FB Neck ROM: full    Dental  (+) Dental Advidsory Given   Pulmonary neg pulmonary ROS   Pulmonary exam normal breath sounds clear to auscultation       Cardiovascular Exercise Tolerance: Good hypertension, (-) angina (-) Cardiac Stents Normal cardiovascular exam(-) dysrhythmias (-) Valvular Problems/Murmurs Rhythm:regular Rate:Normal     Neuro/Psych  PSYCHIATRIC DISORDERS Anxiety     negative neurological ROS     GI/Hepatic Neg liver ROS,GERD  ,,  Endo/Other  negative endocrine ROS    Renal/GU negative Renal ROS  negative genitourinary   Musculoskeletal   Abdominal   Peds  Hematology negative hematology ROS (+)   Anesthesia Other Findings Past Medical History: No date: Anxiety No date: Arthritis     Comment:  hands, neck No date: Cervical disc disorder with radiculopathy of cervical region No date: Cervical spondylosis 01/2024: COVID-19 No date: Degenerative disc disease, cervical     Comment:  no limitations No date: Hyperlipidemia No date: Hypertension     Comment:  neg stress test several yrs ago No date: Lymphocytic colitis   Reproductive/Obstetrics negative OB ROS                              Anesthesia Physical Anesthesia Plan  ASA: 2  Anesthesia Plan: General   Post-op Pain Management:    Induction: Intravenous  PONV Risk Score and Plan: 2 and Ondansetron , Propofol  infusion and Treatment may vary due to age or medical condition  Airway Management Planned: Oral ETT  Additional Equipment:   Intra-op  Plan:   Post-operative Plan: Extubation in OR  Informed Consent: I have reviewed the patients History and Physical, chart, labs and discussed the procedure including the risks, benefits and alternatives for the proposed anesthesia with the patient or authorized representative who has indicated his/her understanding and acceptance.     Dental Advisory Given  Plan Discussed with: Anesthesiologist, CRNA and Surgeon  Anesthesia Plan Comments:          Anesthesia Quick Evaluation  "

## 2024-04-11 NOTE — Transfer of Care (Signed)
 Immediate Anesthesia Transfer of Care Note  Patient: Darryl Ross  Procedure(s) Performed: Right Side C3-4 Foraminotomy, MIS (Right: Spine Cervical)  Patient Location: PACU  Anesthesia Type:General  Level of Consciousness: awake, alert , oriented, and patient cooperative  Airway & Oxygen Therapy: Patient Spontanous Breathing and Patient connected to face mask oxygen  Post-op Assessment: Report given to RN, Post -op Vital signs reviewed and stable, and Patient moving all extremities X 4  Post vital signs: Reviewed and stable  Last Vitals:  Vitals Value Taken Time  BP 131/78 04/11/24 12:00  Temp 36.1 C 04/11/24 11:56  Pulse 74 04/11/24 12:00  Resp 19 04/11/24 12:00  SpO2 95 % 04/11/24 12:00  Vitals shown include unfiled device data.  Last Pain:  Vitals:   04/11/24 1200  TempSrc:   PainSc: 0-No pain         Complications: No notable events documented.

## 2024-04-11 NOTE — Discharge Instructions (Addendum)
 " Your surgeon has performed an operation on your cervical spine (neck) to relieve pressure on the spinal cord and/or nerves.   The following are instructions to help in your recovery once you have been discharged from the hospital. Even if you feel well, it is important that you follow these activity guidelines. If you do not let your neck heal properly from the surgery, you can increase the chance of return of your symptoms and other complications.  *It is ok to take anti-inflammatory medications (naproxen [Aleve], ibuprofen [Advil, Motrin], etc.).   *Regarding compression stockings-  Please wear day and night until you are walking a couple hundred feet three times a day.   Activity    No bending, lifting, or twisting (BLT). Avoid lifting objects heavier than 10 pounds (gallon milk jug).  Where possible, avoid household activities that involve lifting, bending, reaching, pushing, or pulling such as laundry, vacuuming, grocery shopping, and childcare. Try to arrange for help from friends and family for these activities while your back heals.  Increase physical activity slowly as tolerated.  Taking short walks is encouraged, but avoid strenuous exercise. Do not jog, run, bicycle, lift weights, or participate in any other exercises unless specifically allowed by your doctor.  Talk to your doctor before resuming sexual activity.  You should not drive until cleared by your doctor.  Until released by your doctor, you should not return to work or school.  You should rest at home and let your body heal.   You may shower three days after your surgery.  After showering, lightly dab your incision dry. Do not take a tub bath or go swimming until approved by your doctor at your follow-up appointment.  If your doctor ordered a cervical collar (neck brace) for you, you should wear it whenever you are out of bed. You may remove it when lying down or sleeping, but you should wear it at all other times. Not  all neck surgeries require a cervical collar.  If you smoke, we strongly recommend that you quit.  Smoking has been proven to interfere with normal bone healing and will dramatically reduce the success rate of your surgery. Please contact [facility] (800-QUIT-NOW) and use the resources at www.[facility].com for assistance in stopping smoking.  Surgical Incision   If you have a dressing on your incision, you may remove it two days after your surgery. Keep your incision area clean and dry.  If you have staples or stitches on your incision, you should have a follow up scheduled for removal. If you do not have staples or stitches, you will have steri-strips (small pieces of surgical tape) or Dermabond glue. The steri-strips/glue should begin to peel away within about a week (it is fine if the steri-strips fall off before then). If the strips are still in place one week after your surgery, you may gently remove them.  Diet           You may return to your usual diet. However, you may experience discomfort when swallowing in the first month after your surgery. This is normal. You may find that softer foods are more comfortable for you to swallow. Be sure to stay hydrated.  When to Contact Us   You may experience pain in your neck and/or pain between your shoulder blades. This is normal and should improve in the next few weeks with the help of pain medication, muscle relaxers, and rest. Some patients report that a warm compress on the back of the neck  or between the shoulder blades helps.  However, should you experience any of the following, contact us  immediately: New numbness or weakness Pain that is progressively getting worse, and is not relieved by your pain medication, muscle relaxers, rest, and warm compresses Bleeding, redness, swelling, pain, or drainage from surgical incision Chills or flu-like symptoms Fever greater than 101.0 F (38.3 C) Inability to eat, drink fluids, or take  medications Problems with bowel or bladder functions Difficulty breathing or shortness of breath Warmth, tenderness, or swelling in your calf Contact Information How to contact us :  If you have any questions/concerns before or after surgery, you can reach us  at [phone], or you can send a mychart message. We can be reached by phone or mychart 8am-4pm, Monday-Friday.  *Please note: Calls after 4pm are forwarded to a third party answering service. Mychart messages are not routinely monitored during evenings, weekends, and holidays. Please call our office to contact the answering service for urgent concerns during non-business hours.   "

## 2024-04-11 NOTE — Anesthesia Procedure Notes (Signed)
 Procedure Name: Intubation Date/Time: 04/11/2024 9:59 AM  Performed by: Myra Lawless, CRNAPre-anesthesia Checklist: Patient identified, Patient being monitored, Timeout performed, Emergency Drugs available and Suction available Patient Re-evaluated:Patient Re-evaluated prior to induction Oxygen Delivery Method: Circle system utilized Preoxygenation: Pre-oxygenation with 100% oxygen Induction Type: IV induction Ventilation: Mask ventilation without difficulty Laryngoscope Size: Mac and 3 Grade View: Grade I Tube type: Oral Tube size: 7.5 mm Number of attempts: 1 Airway Equipment and Method: Stylet Placement Confirmation: ETT inserted through vocal cords under direct vision, positive ETCO2 and breath sounds checked- equal and bilateral Secured at: 21 cm Tube secured with: Tape Dental Injury: Teeth and Oropharynx as per pre-operative assessment

## 2024-04-11 NOTE — Progress Notes (Signed)
 Called into room, pt c/o nausea.  Noted to be extremely diaphortic and pale. BP 76/50, HR 32. Anesthesia called to bs.  IVF's bolusing, Atropine  0.3 given, 12 lead done.  Pt responded quickly.  Monitored for another hour.  VSS. No c/o's.  Reviewed getting up and down slowly when moving.  D/C to home w/wife.

## 2024-04-12 ENCOUNTER — Encounter: Payer: Self-pay | Admitting: Neurosurgery

## 2024-04-13 ENCOUNTER — Other Ambulatory Visit: Payer: Self-pay | Admitting: Family Medicine

## 2024-04-13 NOTE — Anesthesia Postprocedure Evaluation (Signed)
"   Anesthesia Post Note  Patient: Darryl Ross  Procedure(s) Performed: Right Side C3-4 Foraminotomy, MIS (Right: Spine Cervical)  Patient location during evaluation: PACU Anesthesia Type: General Level of consciousness: awake and alert Pain management: pain level controlled Vital Signs Assessment: post-procedure vital signs reviewed and stable Respiratory status: spontaneous breathing, nonlabored ventilation, respiratory function stable and patient connected to nasal cannula oxygen Cardiovascular status: blood pressure returned to baseline and stable Postop Assessment: no apparent nausea or vomiting Anesthetic complications: no   No notable events documented.   Last Vitals:  Vitals:   04/11/24 1345 04/11/24 1413  BP: 119/85 113/82  Pulse:    Resp:    Temp:    SpO2:      Last Pain:  Vitals:   04/12/24 0841  TempSrc:   PainSc: 3                  Prentice Murphy      "

## 2024-04-21 NOTE — Progress Notes (Unsigned)
" ° °  REFERRING PHYSICIAN:  Duanne Butler DASEN, Md [address] [place],  [place] [address]  DOS: 04/11/24 Right C3-C4 Laminectomy, medial facetectomy and foraminotomy     HISTORY OF PRESENT ILLNESS: Darryl Ross is 2 weeks status post above surgery. Given robaxin  and oxycodone  on discharge from the hospital.   Preop neck and right arm pain***   PHYSICAL EXAMINATION:  NEUROLOGICAL:  General: In no acute distress.   Awake, alert, oriented to person, place, and time.  Pupils equal round and reactive to light.  Facial tone is symmetric.    Strength: Side Biceps Triceps Deltoid Interossei Grip Wrist Ext. Wrist Flex.  R 5 5 5 5 5 5 5   L 5 5 5 5 5 5 5    Incision c/d/i  Imaging:  Nothing new to review.   Assessment / Plan: Darryl Ross is doing well s/p above surgery. Treatment options reviewed with patient and following plan made:   - We discussed activity escalation and I have advised the patient to lift up to 10 pounds until 6 weeks after surgery (until follow up with Dr. Claudene).   - Reviewed wound care.  - Continue current medications including *** - Follow up as scheduled in 4 weeks and prn.   Advised to contact the office if any questions or concerns arise.   Glade Boys PA-C Dept of Neurosurgery  "

## 2024-04-24 ENCOUNTER — Encounter: Payer: Self-pay | Admitting: Orthopedic Surgery

## 2024-04-24 ENCOUNTER — Ambulatory Visit: Admitting: Orthopedic Surgery

## 2024-04-24 VITALS — BP 116/76 | Temp 98.1°F

## 2024-04-24 DIAGNOSIS — Z9889 Other specified postprocedural states: Secondary | ICD-10-CM

## 2024-04-24 DIAGNOSIS — M5010 Cervical disc disorder with radiculopathy, unspecified cervical region: Secondary | ICD-10-CM

## 2024-04-24 DIAGNOSIS — M5011 Cervical disc disorder with radiculopathy,  high cervical region: Secondary | ICD-10-CM

## 2024-05-02 ENCOUNTER — Encounter: Payer: Self-pay | Admitting: Family Medicine

## 2024-05-04 ENCOUNTER — Ambulatory Visit: Admitting: Family Medicine

## 2024-05-04 ENCOUNTER — Encounter: Payer: Self-pay | Admitting: Family Medicine

## 2024-05-04 VITALS — BP 126/80 | HR 61

## 2024-05-04 DIAGNOSIS — R42 Dizziness and giddiness: Secondary | ICD-10-CM

## 2024-05-04 NOTE — Progress Notes (Signed)
 "  Subjective:    Patient ID: Darryl Ross, male    DOB: 1965-05-19, 59 y.o.   MRN: [id number]  Patient states that whenever he stands up he feels extremely lightheaded like he may pass out.  He denies any vertigo.  Symptoms last several seconds and slowly subside.  He states that he has noticed that his heart rate is extremely low at times even in the 40s.  Today's blood pressure is 126/80 with a heart rate of 61.  He denies any chest pain or shortness of breath or pleurisy or hemoptysis.  He denies any irregular heartbeats or palpitations.  No lightheadedness is always associated with position change/standing up. Past Medical History:  Diagnosis Date   Anxiety    Arthritis    hands, neck   Cervical disc disorder with radiculopathy of cervical region    Cervical spondylosis    COVID-19 01/2024   Degenerative disc disease, cervical    no limitations   Hyperlipidemia    Hypertension    neg stress test several yrs ago   Lymphocytic colitis    Past Surgical History:  Procedure Laterality Date   COLONOSCOPY WITH PROPOFOL  N/A 03/04/2015   Procedure: COLONOSCOPY WITH PROPOFOL ;  Surgeon: Rogelia Copping, MD;  Location: [facility];  Service: Endoscopy;  Laterality: N/A;   FORAMINOTOMY, SPINE, CERVICAL, 1 LEVEL Right 04/11/2024   Procedure: Right Side C3-4 Foraminotomy, MIS;  Surgeon: Claudene Penne ORN, MD;  Location: [facility];  Service: Neurosurgery;  Laterality: Right;   HAND SURGERY Right    fracture repair   Current Outpatient Medications on File Prior to Visit  Medication Sig Dispense Refill   ALPRAZolam  (XANAX ) 0.5 MG tablet Take 1 tablet (0.5 mg total) by mouth at bedtime as needed. 30 tablet 0   aspirin 81 MG tablet Take 81 mg by mouth daily.     budesonide  (ENTOCORT EC ) 3 MG 24 hr capsule TAKE 3 CAPSULES (9 MG TOTAL) BY MOUTH EVERY MORNING. 90 capsule 1   Glucosamine-Chondroitin (OSTEO BI-FLEX REGULAR STRENGTH PO) Take 2 tablets by mouth daily.     meclizine  (ANTIVERT ) 25 MG  tablet Take 25 mg by mouth 3 (three) times daily as needed for dizziness.     methocarbamol  (ROBAXIN ) 500 MG tablet Take 1 tablet (500 mg total) by mouth every 6 (six) hours as needed for muscle spasms. 120 tablet 0   metoprolol  succinate (TOPROL -XL) 25 MG 24 hr tablet TAKE 1 TABLET BY MOUTH EVERY DAY (Patient taking differently: Take 25 mg by mouth every morning.) 90 tablet 0   Multiple Vitamin (MULTIVITAMIN) tablet Take 1 tablet by mouth daily.     Omega-3 Fatty Acids (FISH OIL) 1000 MG CAPS Take 2,000 mg by mouth daily.     PARoxetine  (PAXIL ) 20 MG tablet TAKE 1 TABLET BY MOUTH EVERY DAY (Patient taking differently: Take 20 mg by mouth every morning.) 90 tablet 0   Povidone, PF, (IVIZIA DRY EYES) 0.5 % SOLN Place 1 drop into both eyes daily as needed (Dry eyes).     rizatriptan  (MAXALT ) 10 MG tablet TAKE 1 TABLET BY MOUTH AS NEEDED FOR MIGRAINE. MAY REPEAT IN 2 HOURS IF NEEDED 10 tablet 1   rosuvastatin  (CRESTOR ) 5 MG tablet TAKE 1 TABLET BY MOUTH EVERY DAY 90 tablet 1   senna (SENOKOT) 8.6 MG TABS tablet Take 1 tablet (8.6 mg total) by mouth 2 (two) times daily as needed. 15 tablet 0   No current facility-administered medications on file prior to visit.  Allergies  Allergen Reactions   Morphine And Codeine Nausea Only   Statins Other (See Comments)    Effected liver enzymes   Zetia  [Ezetimibe ]     Elevated LFT's   Social History   Socioeconomic History   Marital status: Married    Spouse name: Not on file   Number of children: Not on file   Years of education: Not on file   Highest education level: 12th grade  Occupational History   Not on file  Tobacco Use   Smoking status: Never   Smokeless tobacco: Never  Vaping Use   Vaping status: Never Used  Substance and Sexual Activity   Alcohol use: Yes    Alcohol/week: 4.0 standard drinks of alcohol    Types: 3 Cans of beer, 1 Shots of liquor per week    Comment: wine every night   Drug use: No   Sexual activity: Yes     Comment: avid runner and cyclist.  Married.  Other Topics Concern   Not on file  Social History Narrative   Not on file   Social Drivers of Health   Tobacco Use: Low Risk (05/04/2024)   Patient History    Smoking Tobacco Use: Never    Smokeless Tobacco Use: Never    Passive Exposure: Not on file  Financial Resource Strain: Low Risk (05/03/2024)   Overall Financial Resource Strain (CARDIA)    Difficulty of Paying Living Expenses: Not hard at all  Food Insecurity: No Food Insecurity (05/03/2024)   Epic    Worried About Radiation Protection Practitioner of Food in the Last Year: Never true    Ran Out of Food in the Last Year: Never true  Transportation Needs: No Transportation Needs (05/03/2024)   Epic    Lack of Transportation (Medical): No    Lack of Transportation (Non-Medical): No  Physical Activity: Sufficiently Active (05/03/2024)   Exercise Vital Sign    Days of Exercise per Week: 5 days    Minutes of Exercise per Session: 70 min  Stress: Stress Concern Present (05/03/2024)   [employer] of Occupational Health - Occupational Stress Questionnaire    Feeling of Stress: To some extent  Social Connections: Socially Integrated (05/03/2024)   Social Connection and Isolation Panel    Frequency of Communication with Friends and Family: More than three times a week    Frequency of Social Gatherings with Friends and Family: Once a week    Attends Religious Services: More than 4 times per year    Active Member of Clubs or Organizations: Yes    Attends Banker Meetings: More than 4 times per year    Marital Status: Married  Catering Manager Violence: Not on file  Depression (PHQ2-9): Low Risk (05/04/2024)   Depression (PHQ2-9)    PHQ-2 Score: 0  Alcohol Screen: Low Risk (05/03/2024)   Alcohol Screen    Last Alcohol Screening Score (AUDIT): 3  Housing: Low Risk (05/03/2024)   Epic    Unable to Pay for Housing in the Last Year: No    Number of Times Moved in the Last Year: 0    Homeless in  the Last Year: No  Utilities: Not on file  Health Literacy: Not on file      Review of Systems  Musculoskeletal:  Positive for neck pain.  All other systems reviewed and are negative.      Objective:   Physical Exam Vitals reviewed.  Cardiovascular:     Rate and Rhythm: Normal rate and  regular rhythm.     Heart sounds: Normal heart sounds. No murmur heard. Pulmonary:     Effort: Pulmonary effort is normal. No respiratory distress.     Breath sounds: Normal breath sounds. No stridor. No wheezing or rales.  Musculoskeletal:     Cervical back: No swelling, deformity, spasms or bony tenderness.  Neurological:     Mental Status: He is alert and oriented to person, place, and time.     Coordination: Coordination normal.           Assessment & Plan:  Orthostatic dizziness I believe that this is likely related to his relatively low heart rate and exacerbated by his beta-blocker.  We are going to temporarily discontinue the Toprol -XL 25 mg daily and see if his symptoms improve. "

## 2024-05-10 ENCOUNTER — Other Ambulatory Visit (HOSPITAL_BASED_OUTPATIENT_CLINIC_OR_DEPARTMENT_OTHER): Payer: Self-pay

## 2024-05-22 ENCOUNTER — Encounter: Admitting: Neurosurgery

## 2024-05-24 ENCOUNTER — Encounter: Admitting: Neurosurgery

## 2024-06-27 ENCOUNTER — Other Ambulatory Visit

## 2024-07-03 ENCOUNTER — Encounter: Admitting: Orthopedic Surgery

## 2024-07-06 ENCOUNTER — Encounter: Admitting: Family Medicine
